# Patient Record
Sex: Female | Born: 1950 | Race: White | Hispanic: No | Marital: Married | Smoking: Never smoker
Health system: Southern US, Community
[De-identification: ages and names within clinical notes are randomized; demographics above are authoritative.]

## PROBLEM LIST (undated history)

## (undated) DIAGNOSIS — D6851 Activated protein C resistance: Secondary | ICD-10-CM

## (undated) DIAGNOSIS — Z95810 Presence of automatic (implantable) cardiac defibrillator: Secondary | ICD-10-CM

## (undated) DIAGNOSIS — T7840XA Allergy, unspecified, initial encounter: Secondary | ICD-10-CM

## (undated) DIAGNOSIS — Z95818 Presence of other cardiac implants and grafts: Secondary | ICD-10-CM

## (undated) HISTORY — DX: Allergy, unspecified, initial encounter: T78.40XA

## (undated) HISTORY — DX: Activated protein C resistance: D68.51

---

## 1999-03-08 ENCOUNTER — Other Ambulatory Visit: Admission: RE | Admit: 1999-03-08 | Discharge: 1999-03-08 | Payer: Self-pay | Admitting: Obstetrics and Gynecology

## 1999-05-30 ENCOUNTER — Ambulatory Visit (HOSPITAL_COMMUNITY): Admission: RE | Admit: 1999-05-30 | Discharge: 1999-05-30 | Payer: Self-pay | Admitting: Gastroenterology

## 2000-03-20 ENCOUNTER — Other Ambulatory Visit: Admission: RE | Admit: 2000-03-20 | Discharge: 2000-03-20 | Payer: Self-pay | Admitting: Obstetrics and Gynecology

## 2000-08-31 ENCOUNTER — Encounter: Payer: Self-pay | Admitting: Family Medicine

## 2000-08-31 ENCOUNTER — Encounter: Admission: RE | Admit: 2000-08-31 | Discharge: 2000-08-31 | Payer: Self-pay | Admitting: Family Medicine

## 2001-04-01 ENCOUNTER — Other Ambulatory Visit: Admission: RE | Admit: 2001-04-01 | Discharge: 2001-04-01 | Payer: Self-pay | Admitting: Obstetrics and Gynecology

## 2002-04-08 ENCOUNTER — Other Ambulatory Visit: Admission: RE | Admit: 2002-04-08 | Discharge: 2002-04-08 | Payer: Self-pay | Admitting: Obstetrics and Gynecology

## 2002-06-19 ENCOUNTER — Ambulatory Visit (HOSPITAL_COMMUNITY): Admission: RE | Admit: 2002-06-19 | Discharge: 2002-06-19 | Payer: Self-pay | Admitting: Obstetrics and Gynecology

## 2002-06-19 ENCOUNTER — Encounter (INDEPENDENT_AMBULATORY_CARE_PROVIDER_SITE_OTHER): Payer: Self-pay

## 2002-10-03 ENCOUNTER — Ambulatory Visit (HOSPITAL_COMMUNITY): Admission: RE | Admit: 2002-10-03 | Discharge: 2002-10-03 | Payer: Self-pay | Admitting: Obstetrics and Gynecology

## 2002-10-03 ENCOUNTER — Encounter (INDEPENDENT_AMBULATORY_CARE_PROVIDER_SITE_OTHER): Payer: Self-pay

## 2003-04-24 ENCOUNTER — Other Ambulatory Visit: Admission: RE | Admit: 2003-04-24 | Discharge: 2003-04-24 | Payer: Self-pay | Admitting: Obstetrics and Gynecology

## 2004-06-06 ENCOUNTER — Other Ambulatory Visit: Admission: RE | Admit: 2004-06-06 | Discharge: 2004-06-06 | Payer: Self-pay | Admitting: Obstetrics and Gynecology

## 2005-07-05 ENCOUNTER — Other Ambulatory Visit: Admission: RE | Admit: 2005-07-05 | Discharge: 2005-07-05 | Payer: Self-pay | Admitting: Obstetrics and Gynecology

## 2006-05-16 ENCOUNTER — Other Ambulatory Visit: Admission: RE | Admit: 2006-05-16 | Discharge: 2006-05-16 | Payer: Self-pay | Admitting: Family Medicine

## 2006-07-04 ENCOUNTER — Encounter: Admission: RE | Admit: 2006-07-04 | Discharge: 2006-07-04 | Payer: Self-pay | Admitting: Family Medicine

## 2006-07-17 ENCOUNTER — Encounter: Admission: RE | Admit: 2006-07-17 | Discharge: 2006-07-17 | Payer: Self-pay | Admitting: Family Medicine

## 2007-05-20 ENCOUNTER — Other Ambulatory Visit: Admission: RE | Admit: 2007-05-20 | Discharge: 2007-05-20 | Payer: Self-pay | Admitting: Family Medicine

## 2008-07-06 ENCOUNTER — Other Ambulatory Visit: Admission: RE | Admit: 2008-07-06 | Discharge: 2008-07-06 | Payer: Self-pay | Admitting: Family Medicine

## 2009-07-07 ENCOUNTER — Other Ambulatory Visit: Admission: RE | Admit: 2009-07-07 | Discharge: 2009-07-07 | Payer: Self-pay | Admitting: Family Medicine

## 2010-09-22 ENCOUNTER — Other Ambulatory Visit: Payer: Self-pay | Admitting: Physician Assistant

## 2010-09-22 ENCOUNTER — Other Ambulatory Visit (HOSPITAL_COMMUNITY)
Admission: RE | Admit: 2010-09-22 | Discharge: 2010-09-22 | Disposition: A | Discharge status: Home / Self Care | Payer: BC Managed Care – PPO | Source: Ambulatory Visit | Attending: Family Medicine | Admitting: Family Medicine

## 2010-09-22 DIAGNOSIS — Z124 Encounter for screening for malignant neoplasm of cervix: Secondary | ICD-10-CM | POA: Insufficient documentation

## 2010-10-05 ENCOUNTER — Other Ambulatory Visit: Payer: Self-pay | Admitting: Dermatology

## 2010-10-07 NOTE — Op Note (Signed)
   NAME:  Jacqueline Bruce, Jacqueline Bruce                        ACCOUNT NO.:  [id number]   MEDICAL RECORD NO.:  [id number]                   PATIENT TYPE:  AMB   LOCATION:  [facility]C                                  FACILITY:  [facility]   PHYSICIAN:  Juluis Mire, M.D.                DATE OF BIRTH:  [birth year]   DATE OF PROCEDURE:  10/03/2002  DATE OF DISCHARGE:                                 OPERATIVE REPORT   PREOPERATIVE DIAGNOSIS:  History of complex hyperplasia.   POSTOPERATIVE DIAGNOSIS:  History of complex hyperplasia.   OPERATIVE PROCEDURE:  Hysteroscopy.  Multiple endometrial biopsies.  Endometrial curettings.   SURGEON:  Juluis Mire, M.D.   ANESTHESIA:  General.   ESTIMATED BLOOD LOSS:  Minimal.   PACKS AND DRAINS:  None.   INTRAOPERATIVE BLOOD REPLACED:  None.   COMPLICATIONS:  None.   INDICATIONS FOR PROCEDURE:  Dictated in the history and physical.   PROCEDURE:  The patient was taken to the OR and placed in the supine  position.  After a satisfactory level of general anesthesia had been  obtained, the patient was placed in the dorsal lithotomy position using the  Allen stirrups.  The perineum and vagina were prepped out with Betadine and  draped as a sterile field.  A speculum was placed in the vaginal vault.  The  cervix was grasped with a single-tooth tenaculum.  The uterus sounded to  approximately 7 cm.  The cervix was serially dilated to a size 37 Pratt  dilator.  The operative hysteroscope was introduced.  The uterine cavity was  visualized after distension with sorbitol.  The endometrium appeared to be  smooth and atrophic.  There was no polyp, outgrowths, or other abnormalities  noted.  We did multiple random endometrial biopsies from the anterior,  posterior, and lateral walls.  There were no signs of complications or  perforation, and no active bleeding was noted.  We subsequently obtained  endometrial curettings.  At this point in time, the single-tooth tenaculum  and  speculum were then removed.  The patient was taken out of the dorsal  lithotomy position and once alert and extubated was transferred to the  recovery room in good condition.  Sponge, needle, and instrument counts were  correct.                                               Juluis Mire, M.D.    JSM/MEDQ  D:  10/03/2002  T:  10/03/2002  Job:  [id number]

## 2010-10-07 NOTE — H&P (Signed)
NAME:  Jacqueline Bruce, Jacqueline Bruce NO.:  [id number]   MEDICAL RECORD NO.:  [id number]                   PATIENT TYPE:  AMB   LOCATION:  SDC                                  FACILITY:  [facility]   PHYSICIAN:  Juluis Mire, M.D.                DATE OF BIRTH:  1951-04-29   DATE OF ADMISSION:  10/03/2002  DATE OF DISCHARGE:                                HISTORY & PHYSICAL   HISTORY OF PRESENT ILLNESS:  The patient is a 60 year old, G2, P0, AB2,  married, white female who presents for hysteroscopic evaluation.  In  relation to the present admission, the patient has had a previous bilateral  tubal ligation.  Because of abnormal bleeding in the form of premenstrual  spotting, she underwent a saline infusion ultrasound earlier this year that  did reveal a large endometrial polyp.  She subsequently underwent  hysteroscopic resection of this polyp in January 2004.  Pathology did reveal  complex hyperplasia without atypia.  They said some of the resection had  worrisome changes and they recommended clinical followup.  We had discussed  with the patient these findings and had offered her options.  She decided to  proceed with cycling with Aygestin which she has done with some continued  minimal withdraw bleeding and she now presents for repeat hysteroscopic  evaluation to rule out any type of further endometrial issues.   ALLERGIES:  No known drug allergies.   MEDICATIONS:  None.   PAST MEDICAL HISTORY:  1. Heterozygous for Leiden factor V mutation.  She has been seen by Dr.     Myna Hidalgo for that in the past and is not on any medication.  2. History of uterine fibroids.  3. It has been told that she has a presumptive diagnosis of endometriosis     due to symptomatology, although no surgical evaluation has been     undertaken.   PAST SURGICAL HISTORY:  Laparoscopic bilateral tubal ligation.   PAST OBSTETRICAL HISTORY:  She has had two abortions.   FAMILY HISTORY:   Noncontributory.   SOCIAL HISTORY:  No tobacco or alcohol use.   REVIEW OF SYMPTOMS:  Noncontributory.   PHYSICAL EXAMINATION:  VITAL SIGNS:  Afebrile, stable vital signs.  HEENT:  The patient is normocephalic.  Pupils equal round and reactive to  light and accommodation.  Extraocular movements intact.  Extraocular  movements intact.  Sclerae and conjunctivae are clear.  Oropharynx clear.  NECK:  Without thyromegaly.  BREASTS:  No discrete masses.  LUNGS:  Clear.  CARDIAC:  Regular rate and rhythm without murmurs, rubs or gallops.  ABDOMEN:  Benign.  No masses, organomegaly or tenderness.  PELVIC:  Normal external genitalia.  Vaginal cuff is clear.  Cervix  unremarkable.  Uterus normal size, shape and contour.  Adnexa free of masses  or tenderness.  Rectovaginal exam is clear.  EXTREMITIES:  Trace edema.  NEUROLOGIC:  Grossly  within normal limits.   IMPRESSION:  Previous hysteroscopic evaluation with finding of complex  hyperplasia.   PLAN:  At the present time, the patient underwent repeat hysteroscopy and  biopsies to rule out any persistent issues or other complications.  The  risks of surgery have been discussed including the risk of infection, risk  of vascular injury that could lead to hemorrhage requiring possible  transfusion or hysterectomy, risk of perforation that could lead to injury  to bowel or other nearby organs requiring exploratory surgery and the risk  of deep venous thrombosis and pulmonary embolus.  The patient will be given  preop heparin.                                               Juluis Mire, M.D.    JSM/MEDQ  D:  10/03/2002  T:  10/03/2002  Job:  [id number]

## 2010-10-07 NOTE — Op Note (Signed)
   NAME:  Jacqueline Bruce, Jacqueline Bruce                        ACCOUNT NO.:  [id number]   MEDICAL RECORD NO.:  [id number]                   PATIENT TYPE:  AMB   LOCATION:  SDC                                  FACILITY:  [facility]   PHYSICIAN:  Juluis Mire, M.D.                DATE OF BIRTH:  03-30-1951   DATE OF PROCEDURE:  06/19/2002  DATE OF DISCHARGE:                                 OPERATIVE REPORT   PREOPERATIVE DIAGNOSES:  1. Abnormal uterine bleeding.  2. Endometrial polyp.   POSTOPERATIVE DIAGNOSES:  1. Abnormal uterine bleeding.  2. Endometrial polyp.   OPERATIVE PROCEDURES:  1. Paracervical block.  2. Cervical dilation.  3. Hysteroscopy.  4. Resection of polyp and multiple endometrial biopsies.  5. Endometrial curettings.   SURGEON:  Juluis Mire, M.D.   ANESTHESIA:  Paracervical block and sedation.   ESTIMATED BLOOD LOSS:  Minimal.   PACKS AND DRAINS:  None.   FLUIDS REPLACED:  No intraoperative blood replaced.   COMPLICATIONS:  None.   INDICATIONS FOR PROCEDURE:  These are dictated in the history and physical.  It is of note that the patient did receive preoperative heparin due to  history of being heterozygous for Leiden factor V.   DESCRIPTION OF PROCEDURE:  The patient was taken to the OR and placed in the  supine position.  After light sedation, was placed in the dorsal lithotomy  position using Allen stirrups.  The patient was draped out for hysteroscopy.  A speculum was placed in the vaginal vault.  The cervix and vagina were  cleansed with Betadine.  A paracervical block was instilled using 1%  Xylocaine.  The cervix was secured with single-tooth tenaculum.   The uterus sounded to approximately 8 cm.  The cervix dilated to size 33  Pratt dilator.  The hysteroscope was then introduced.  Visualization  revealed small polyp-like outgrowths from the fundal area as well as the  right lateral uterine wall.  These were resected and sent for pathology  review.   Multiple endometrial samplings were also obtained from the anterior  and posterior lateral walls.  We then obtained endometrial curettings.  There was no active bleeding or signs of perforation.   The hysteroscope, single-tooth tenaculum, and speculum were then removed.  The patient was taken out of the dorsal lithotomy position.   Once alert, the patient was transferred to the recovery room in good  condition.  Sponge and instrument counts reported correct per the  circulating nurse.                                               Juluis Mire, M.D.    JSM/MEDQ  D:  06/19/2002  T:  06/19/2002  Job:  [id number]

## 2010-10-07 NOTE — H&P (Signed)
NAME:  Jacqueline Bruce, BOLANOS NO.:  [id number]   MEDICAL RECORD NO.:  [id number]                   PATIENT TYPE:  AMB   LOCATION:  [facility]C                                  FACILITY:  [facility]   PHYSICIAN:  Juluis Mire, M.D.                DATE OF BIRTH:  November 12, 1950   DATE OF ADMISSION:  DATE OF DISCHARGE:                                HISTORY & PHYSICAL   CHIEF COMPLAINT:  The patient is a 60 year old gravida 2 para 0 abortus 2  married white female who presents for hysteroscopic evaluation.   HISTORY OF PRESENT ILLNESS:  The patient has had a previous bilateral tubal  ligation.  Because of abnormal bleeding in the form of premenstrual  spotting.  She underwent a saline infusion ultrasound that did reveal a  large endometrial polyp.  The patient now presents for hysteroscopic  evaluation and resection to rule out endometrial pathology.   ALLERGIES:  No known drug allergies.   MEDICATIONS:  None.   PAST MEDICAL HISTORY:  She is heterozygous for Leiden factor V mutation.  Has been seen by Dr. Myna Hidalgo for that.  Is not on any medication.  Does have  a history of uterine fibroids and has been told that she has presumptive  diagnosis of endometriosis due to symptomatology but no active surgical  evaluation has been undertaken.   PREVIOUS SURGICAL HISTORY:  Laparoscopic bilateral tubal ligation.   OBSTETRICAL HISTORY:  She has had two abortions.   FAMILY HISTORY:  Noncontributory.   SOCIAL HISTORY:  No tobacco or alcohol use.   REVIEW OF SYSTEMS:  Noncontributory.   PHYSICAL EXAMINATION:  VITAL SIGNS:  The patient is afebrile with stable  vital signs.  HEENT:  The patient normocephalic.  Pupils are equal, round, and reactive to  light and accommodation.  Extraocular movements are intact.  Sclerae and  conjunctivae are clear, oropharynx clear.  NECK:  Without thyromegaly.  BREASTS:  No discrete masses.  LUNGS:  Clear.  CARDIOVASCULAR:  Regular rhythm and  rate without murmurs or gallops.  ABDOMEN:  Benign.  No masses, organomegaly, or tenderness.  PELVIC:  Normal external genitalia, vaginal mucosa is clear.  Cervix is  unremarkable.  Uterus is normal size, shape, and contour.  Adnexa free of  masses or tenderness.  Rectovaginal exam is clear.  EXTREMITIES:  Trace edema.  NEUROLOGIC:  Grossly within normal limits.   IMPRESSION:  1. Abnormal uterine bleeding with evidence of endometrial polyp.  2. Heterozygous for Leiden factor V mutation.   PLAN:  The patient to undergo outpatient hysteroscopy with D&C.  This will  be under sedation with local.  Risks have been discussed including the risk  of infection; the risk of vascular injury that could require transfusion or  possible hysterectomy; the risk of uterine perforation that could lead to  injury to adjacent organs; the risk of deep venous thrombosis and pulmonary  embolus.  The patient professed an understanding of the indications and  risks.                                               Juluis Mire, M.D.    JSM/MEDQ  D:  06/19/2002  T:  06/19/2002  Job:  [id number]

## 2013-11-10 ENCOUNTER — Telehealth: Payer: Self-pay | Admitting: *Deleted

## 2013-11-10 NOTE — Telephone Encounter (Signed)
I returned her call.  She stated,my feet bother me with certain shoes.  The pins are protruding now.  Can the pins be taken out?  I informed her that yes, they can be taken out.  She asked how long she would be out of work or unable to walk.  I told her she would be able to walk immediately.  She asked if we still had her chart from 2008.  I told her we probably don't because we're part of [facility] now on the [facility] system.  I told her it's probably in storage.  She stated so you'll probably have to take x-rays again.  I told her yes, she will have to be x-rayed.  I transferred her to a scheduler to make an appointment with Dr. Amalia Hailey.

## 2013-11-10 NOTE — Telephone Encounter (Signed)
I'd like to look into getting the pins removed.  Had surgery in February and August 2008, Bunionectomy.  My thought is I don't need x-rays.  Is this a possibility?  I attempted to call her back there was not phone connection at this number.

## 2013-11-24 ENCOUNTER — Encounter: Payer: Self-pay | Admitting: Podiatry

## 2013-11-24 ENCOUNTER — Ambulatory Visit (INDEPENDENT_AMBULATORY_CARE_PROVIDER_SITE_OTHER): Payer: 59 | Admitting: Podiatry

## 2013-11-24 ENCOUNTER — Ambulatory Visit (INDEPENDENT_AMBULATORY_CARE_PROVIDER_SITE_OTHER): Payer: 59

## 2013-11-24 VITALS — BP 110/68 | HR 60 | Resp 12

## 2013-11-24 DIAGNOSIS — R52 Pain, unspecified: Secondary | ICD-10-CM

## 2013-11-24 DIAGNOSIS — Z472 Encounter for removal of internal fixation device: Secondary | ICD-10-CM

## 2013-11-24 NOTE — Progress Notes (Signed)
   Subjective:    Patient ID: Jacqueline Bruce, female    DOB: Nov 30, 1950, 63 y.o.   MRN: [id number]  HPI  PT STATED HAD BUNION SURGERY  ON BOTH FEET AND THE PIN START BOTHERING HER FOR 1 YEAR. THE FEET ARE GETTING WORSE. THE FEET GET AGGRAVATED BY WEARING CERTAIN SHOES AND TRIED NO TREATMENT.  This patient is complaining of discomfort with tight shoes especially ski boots over the K wire fixation from a previous osteotomies to correct bunionectomies many years ago. The symptoms are relieved when she wears a soft shoe  Review of Systems  All other systems reviewed and are negative.      Objective:   Physical Exam Orientated x3 white female  Vascular: DP and PT pulses 2/4 bilaterally  Neurological: Ankle flex equal and reactive bilaterally Vibratory sensation intact bilaterally  Dermatological: Well-healed surgical scars over the left first MPJ and second MPJ noted bilaterally There is a palpable edge of the K wire of the right and left first metatarsal when palpated duplicates her area of discomfort.  Musculoskeletal: No deformities noted  X-ray examination right foot  Intact bony structures done a fracture and/or dislocation noted  Retained internal cross K wire fixation first metatarsal with a circular wire over the back edges of the K wires. There is no movement of the K wires  Well-healed first metatarsal osteotomy  Well-healed second left metatarsal osteotomy with internal screw fixation  Radiographic impression:  Well-healed first and second left metatarsal osteotomies with retained internal fixation  The K wires on the left first metatarsal are symptomatic with tight ski boots as noted with a circular marker      X-ray examination left foot  Intact bony structure without fracture and/or dislocation noted  Retained internal cross K wire fixation left first metatarsal without any movement of this fixation. A circular marker over the back end of the K wire which was  clinically significant was noted  The first metatarsal the osteotomy is well-healed  Internal screw fixation second left metatarsal with well-healed metatarsal osteotomy  Radiographic impression:  Well-healed first and second left metatarsal osteotomies  Retained internal fixation left first metatarsal with clinical discomfort with tight shoes as ski boots  Retained internal fixation second left metatarsal without any clinical symptoms       Assessment & Plan:   Assessment: Symptomatic internal fixation (cross K wire fixation) first metatarsals bilaterally  Plan: I advised patient that the internal fixation was in satisfactory position, however, if tight shoes irritated these areas the K wires could be removed. I described the surgical removal of these wires as an outpatient procedure and the need to reduce her standing walking for several weeks after surgery.  Patient is currently walking dogs on a regular basis and cannot take 3 weeks off to allow incisions to heal.  She'll notify us when she request removal of the internal fixation in the first metatarsals bilaterally

## 2013-11-24 NOTE — Patient Instructions (Signed)
Contact the office when you want to have the internal fixation removed in the first metatarsals on the right and left feet

## 2013-11-25 ENCOUNTER — Encounter: Payer: Self-pay | Admitting: Podiatry

## 2013-12-22 ENCOUNTER — Other Ambulatory Visit (HOSPITAL_COMMUNITY)
Admission: RE | Admit: 2013-12-22 | Discharge: 2013-12-22 | Disposition: A | Discharge status: Home / Self Care | Payer: 59 | Source: Ambulatory Visit | Attending: Family Medicine | Admitting: Family Medicine

## 2013-12-22 ENCOUNTER — Other Ambulatory Visit: Payer: Self-pay | Admitting: Physician Assistant

## 2013-12-22 DIAGNOSIS — Z124 Encounter for screening for malignant neoplasm of cervix: Secondary | ICD-10-CM | POA: Diagnosis present

## 2013-12-23 LAB — CYTOLOGY - PAP

## 2016-01-06 ENCOUNTER — Other Ambulatory Visit (HOSPITAL_COMMUNITY)
Admission: RE | Admit: 2016-01-06 | Discharge: 2016-01-06 | Disposition: A | Discharge status: Home / Self Care | Payer: BLUE CROSS/BLUE SHIELD | Source: Ambulatory Visit | Attending: Family Medicine | Admitting: Family Medicine

## 2016-01-06 ENCOUNTER — Other Ambulatory Visit: Payer: Self-pay | Admitting: Physician Assistant

## 2016-01-06 DIAGNOSIS — Z124 Encounter for screening for malignant neoplasm of cervix: Secondary | ICD-10-CM | POA: Diagnosis present

## 2016-01-07 LAB — CYTOLOGY - PAP

## 2016-07-18 ENCOUNTER — Ambulatory Visit
Admission: RE | Admit: 2016-07-18 | Discharge: 2016-07-18 | Disposition: A | Discharge status: Home / Self Care | Payer: Medicare Other | Source: Ambulatory Visit | Attending: Physician Assistant | Admitting: Physician Assistant

## 2016-07-18 ENCOUNTER — Other Ambulatory Visit: Payer: Self-pay | Admitting: Physician Assistant

## 2016-07-18 DIAGNOSIS — M25551 Pain in right hip: Secondary | ICD-10-CM

## 2016-07-18 IMAGING — CR DG HIP (WITH OR WITHOUT PELVIS) 3-4V BILAT
3 series · 3 of 3 positions shown · non-contrast
Comparison: No recent prior.

CLINICAL DATA: Bilateral hip pain, right side greater than left.

EXAM:
DG HIP (WITH OR WITHOUT PELVIS) 3-4V BILAT

[w pelvis]
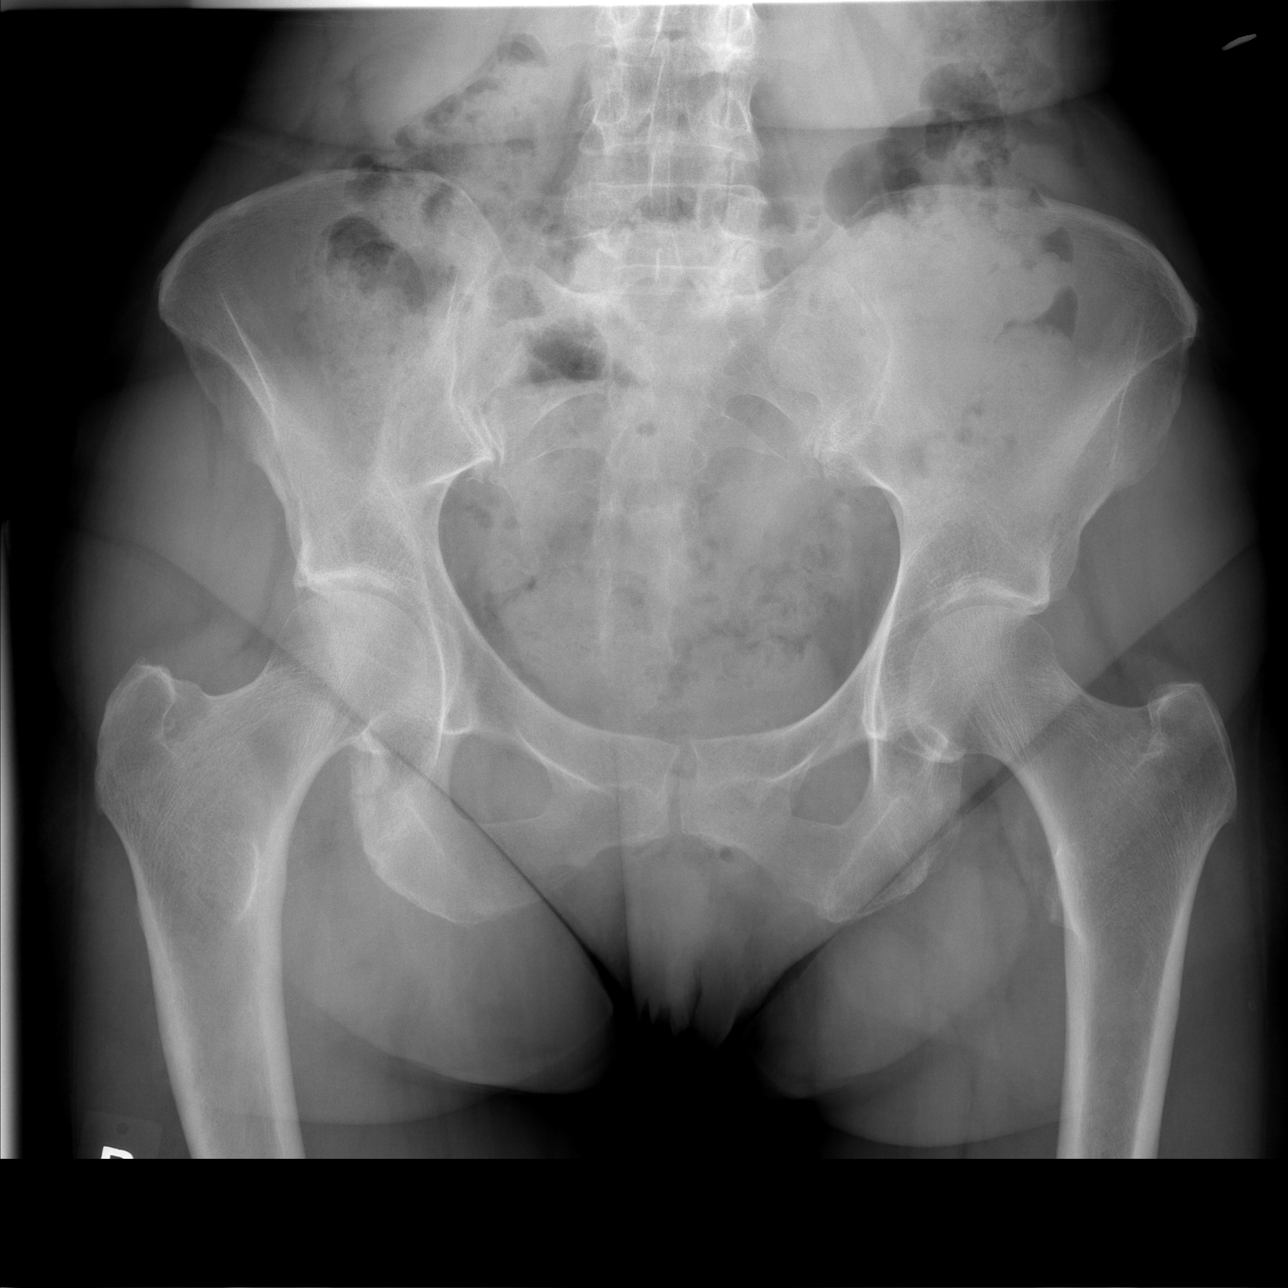

[t hip frog leg left]
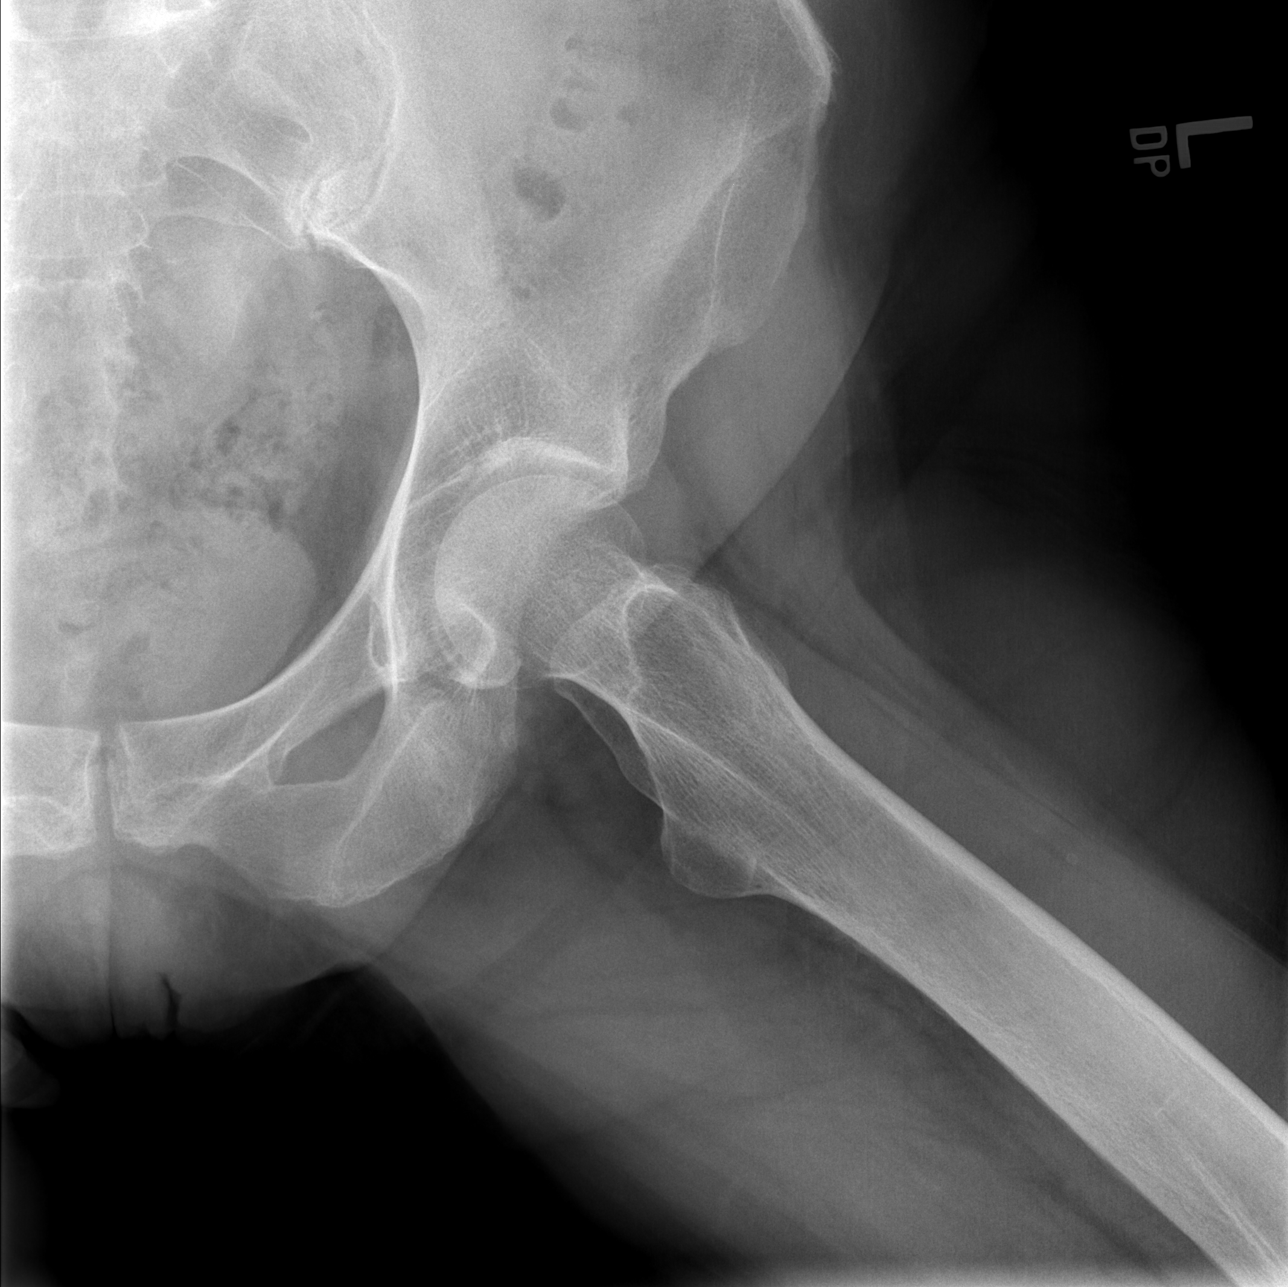

[t hip frog leg right]
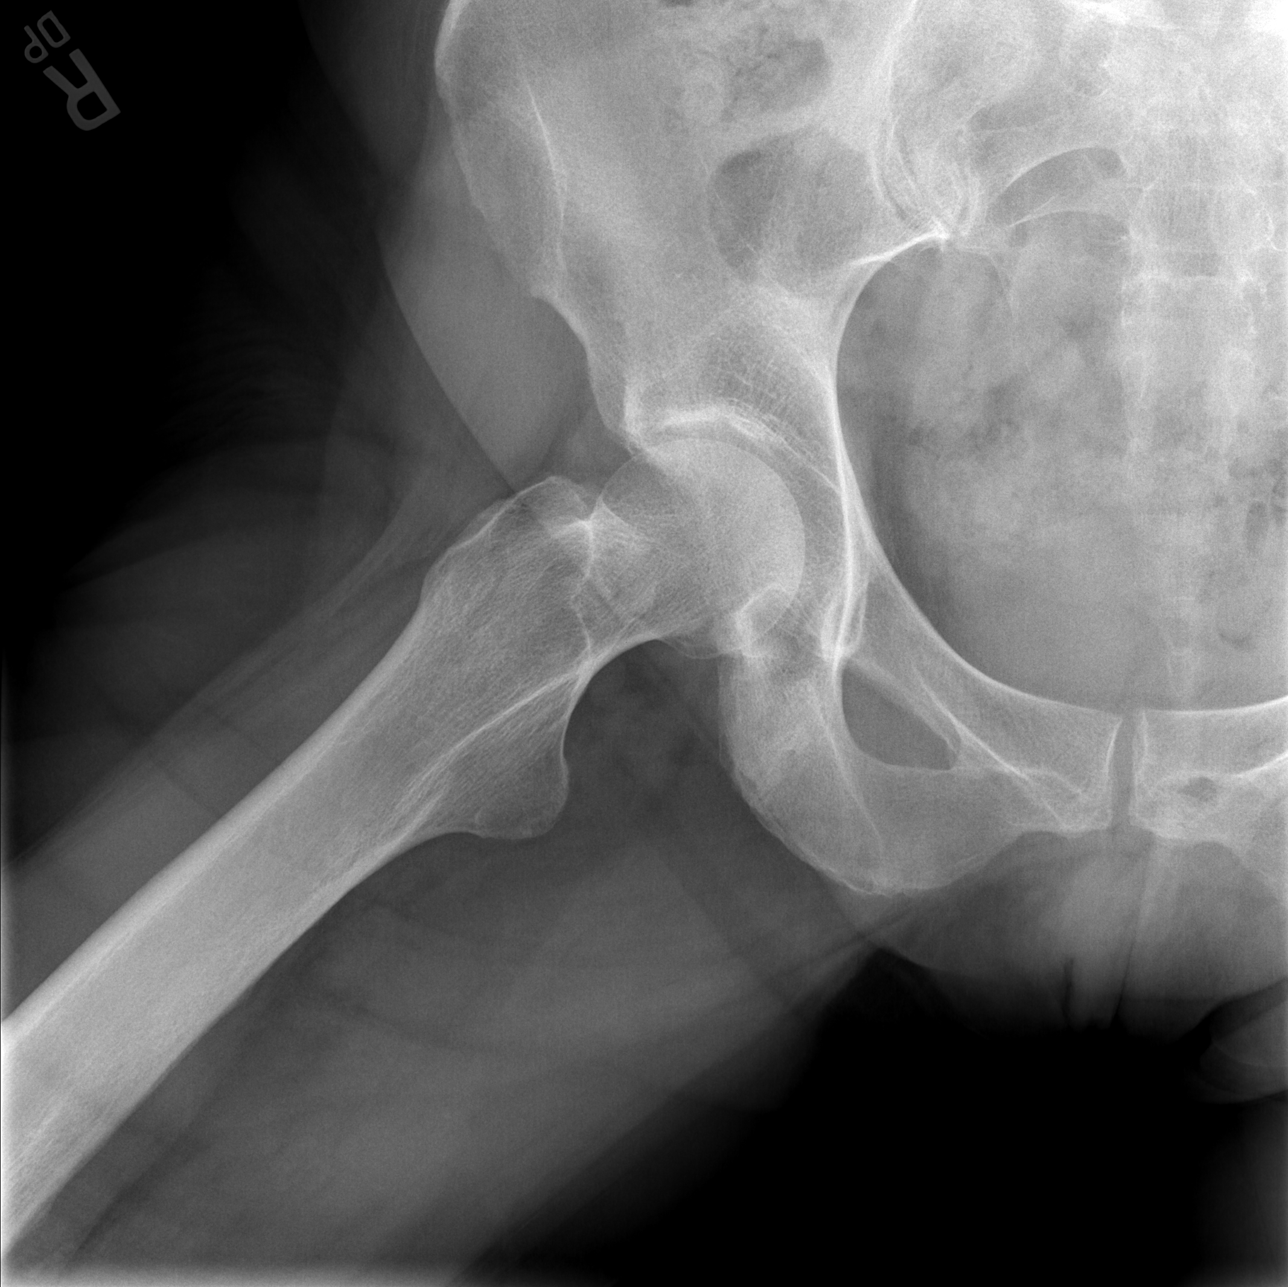

[3 of 3 positions shown; findings below may reference images not displayed]

FINDINGS: Mild degenerative changes both hips. No acute abnormality
identified. No focal abnormality.
IMPRESSION: Mild degenerative changes both hips.  No acute abnormality.

## 2019-04-15 ENCOUNTER — Observation Stay (HOSPITAL_COMMUNITY): Payer: Medicare Other

## 2019-04-15 ENCOUNTER — Inpatient Hospital Stay (HOSPITAL_COMMUNITY)
Admission: EM | Admit: 2019-04-15 | Discharge: 2019-04-17 | DRG: 066 | Disposition: A | Discharge status: Home / Self Care | Payer: Medicare Other | Attending: Internal Medicine | Admitting: Internal Medicine

## 2019-04-15 ENCOUNTER — Observation Stay (HOSPITAL_BASED_OUTPATIENT_CLINIC_OR_DEPARTMENT_OTHER): Payer: Medicare Other

## 2019-04-15 ENCOUNTER — Encounter (HOSPITAL_COMMUNITY): Payer: Self-pay | Admitting: Emergency Medicine

## 2019-04-15 ENCOUNTER — Other Ambulatory Visit: Payer: Self-pay

## 2019-04-15 ENCOUNTER — Emergency Department (HOSPITAL_COMMUNITY): Payer: Medicare Other

## 2019-04-15 DIAGNOSIS — E785 Hyperlipidemia, unspecified: Secondary | ICD-10-CM | POA: Diagnosis present

## 2019-04-15 DIAGNOSIS — R4781 Slurred speech: Secondary | ICD-10-CM

## 2019-04-15 DIAGNOSIS — I749 Embolism and thrombosis of unspecified artery: Secondary | ICD-10-CM

## 2019-04-15 DIAGNOSIS — R001 Bradycardia, unspecified: Secondary | ICD-10-CM | POA: Diagnosis present

## 2019-04-15 DIAGNOSIS — R4701 Aphasia: Secondary | ICD-10-CM | POA: Diagnosis present

## 2019-04-15 DIAGNOSIS — R29701 NIHSS score 1: Secondary | ICD-10-CM | POA: Diagnosis present

## 2019-04-15 DIAGNOSIS — M8580 Other specified disorders of bone density and structure, unspecified site: Secondary | ICD-10-CM

## 2019-04-15 DIAGNOSIS — F419 Anxiety disorder, unspecified: Secondary | ICD-10-CM

## 2019-04-15 DIAGNOSIS — G459 Transient cerebral ischemic attack, unspecified: Secondary | ICD-10-CM | POA: Diagnosis present

## 2019-04-15 DIAGNOSIS — I10 Essential (primary) hypertension: Secondary | ICD-10-CM | POA: Diagnosis present

## 2019-04-15 DIAGNOSIS — Z20828 Contact with and (suspected) exposure to other viral communicable diseases: Secondary | ICD-10-CM | POA: Diagnosis present

## 2019-04-15 DIAGNOSIS — I6340 Cerebral infarction due to embolism of unspecified cerebral artery: Principal | ICD-10-CM | POA: Diagnosis present

## 2019-04-15 DIAGNOSIS — Z7982 Long term (current) use of aspirin: Secondary | ICD-10-CM

## 2019-04-15 DIAGNOSIS — Z8249 Family history of ischemic heart disease and other diseases of the circulatory system: Secondary | ICD-10-CM

## 2019-04-15 DIAGNOSIS — I639 Cerebral infarction, unspecified: Secondary | ICD-10-CM

## 2019-04-15 HISTORY — DX: Slurred speech: R47.81

## 2019-04-15 HISTORY — DX: Embolism and thrombosis of unspecified artery: I74.9

## 2019-04-15 HISTORY — DX: Embolism and thrombosis of unspecified artery: G45.9

## 2019-04-15 HISTORY — DX: Other specified disorders of bone density and structure, unspecified site: M85.80

## 2019-04-15 HISTORY — DX: Bradycardia, unspecified: R00.1

## 2019-04-15 HISTORY — DX: Anxiety disorder, unspecified: F41.9

## 2019-04-15 LAB — CBC
HCT: 42.9 % (ref 36.0–46.0)
Hemoglobin: 14 g/dL (ref 12.0–15.0)
MCH: 30.3 pg (ref 26.0–34.0)
MCHC: 32.6 g/dL (ref 30.0–36.0)
MCV: 92.9 fL (ref 80.0–100.0)
Platelets: 258 10*3/uL (ref 150–400)
RBC: 4.62 MIL/uL (ref 3.87–5.11)
RDW: 13.2 % (ref 11.5–15.5)
WBC: 6.7 10*3/uL (ref 4.0–10.5)
nRBC: 0 % (ref 0.0–0.2)

## 2019-04-15 LAB — DIFFERENTIAL
Abs Immature Granulocytes: 0.02 10*3/uL (ref 0.00–0.07)
Basophils Absolute: 0 10*3/uL (ref 0.0–0.1)
Basophils Relative: 0 %
Eosinophils Absolute: 0.1 10*3/uL (ref 0.0–0.5)
Eosinophils Relative: 2 %
Immature Granulocytes: 0 %
Lymphocytes Relative: 31 %
Lymphs Abs: 2.1 10*3/uL (ref 0.7–4.0)
Monocytes Absolute: 0.5 10*3/uL (ref 0.1–1.0)
Monocytes Relative: 8 %
Neutro Abs: 4 10*3/uL (ref 1.7–7.7)
Neutrophils Relative %: 59 %

## 2019-04-15 LAB — COMPREHENSIVE METABOLIC PANEL
ALT: 22 U/L (ref 0–44)
AST: 25 U/L (ref 15–41)
Albumin: 4.2 g/dL (ref 3.5–5.0)
Alkaline Phosphatase: 58 U/L (ref 38–126)
Anion gap: 8 (ref 5–15)
BUN: 22 mg/dL (ref 8–23)
CO2: 25 mmol/L (ref 22–32)
Calcium: 10.5 mg/dL — ABNORMAL HIGH (ref 8.9–10.3)
Chloride: 107 mmol/L (ref 98–111)
Creatinine, Ser: 1.12 mg/dL — ABNORMAL HIGH (ref 0.44–1.00)
GFR calc Af Amer: 59 mL/min — ABNORMAL LOW (ref >60)
GFR calc non Af Amer: 51 mL/min — ABNORMAL LOW (ref >60)
Glucose, Bld: 98 mg/dL (ref 70–99)
Potassium: 4.1 mmol/L (ref 3.5–5.1)
Sodium: 140 mmol/L (ref 135–145)
Total Bilirubin: 0.6 mg/dL (ref 0.3–1.2)
Total Protein: 7.3 g/dL (ref 6.5–8.1)

## 2019-04-15 LAB — I-STAT CHEM 8, ED
BUN: 29 mg/dL — ABNORMAL HIGH (ref 8–23)
Calcium, Ion: 1.13 mmol/L — ABNORMAL LOW (ref 1.15–1.40)
Chloride: 109 mmol/L (ref 98–111)
Creatinine, Ser: 1 mg/dL (ref 0.44–1.00)
Glucose, Bld: 96 mg/dL (ref 70–99)
HCT: 43 % (ref 36.0–46.0)
Hemoglobin: 14.6 g/dL (ref 12.0–15.0)
Potassium: 4.5 mmol/L (ref 3.5–5.1)
Sodium: 136 mmol/L (ref 135–145)
TCO2: 24 mmol/L (ref 22–32)

## 2019-04-15 LAB — ECHOCARDIOGRAM COMPLETE
Height: 67 in
Weight: 2480 oz

## 2019-04-15 LAB — APTT: aPTT: 28 seconds (ref 24–36)

## 2019-04-15 LAB — PROTIME-INR
INR: 0.9 (ref 0.8–1.2)
Prothrombin Time: 12.5 seconds (ref 11.4–15.2)

## 2019-04-15 LAB — SARS CORONAVIRUS 2 (TAT 6-24 HRS): SARS Coronavirus 2: NEGATIVE

## 2019-04-15 LAB — CBG MONITORING, ED: Glucose-Capillary: 79 mg/dL (ref 70–99)

## 2019-04-15 IMAGING — CT CT HEAD CODE STROKE
3 series · 15 of 47 positions shown, 18 images · non-contrast
Comparison: None.

CLINICAL DATA: Code stroke.  68-year-old female.  Resolved aphasia.

EXAM:
CT HEAD WITHOUT CONTRAST
TECHNIQUE: Contiguous axial images were obtained from the base of the skull
through the vertex without intravenous contrast.

[Series 3: head 5.0 st · axial · 0.43mm/px · z∈[-138,+2]mm · 9 of 34 slices shown, 12 images]
[im 3/34  brain]
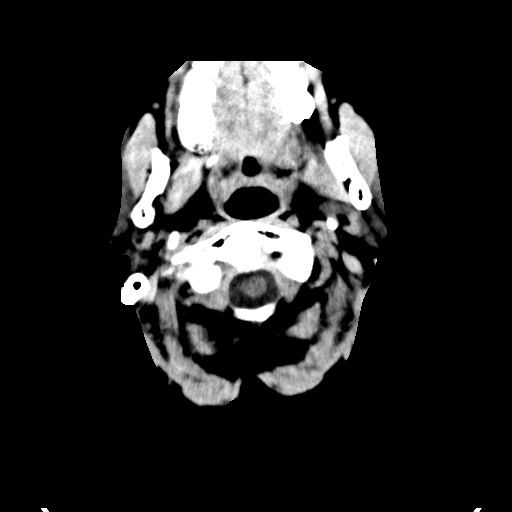
[im 3/34  bone]
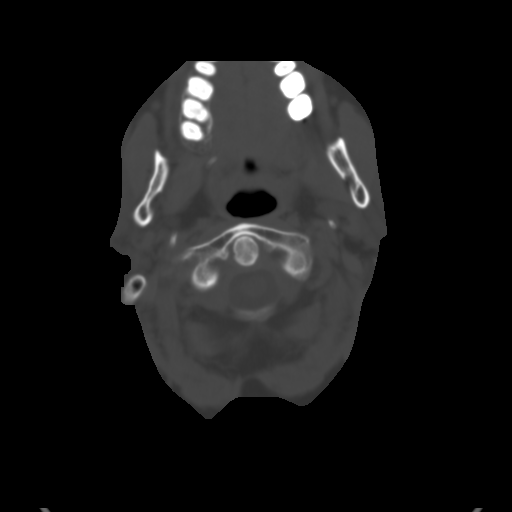
[im 6/34  brain]
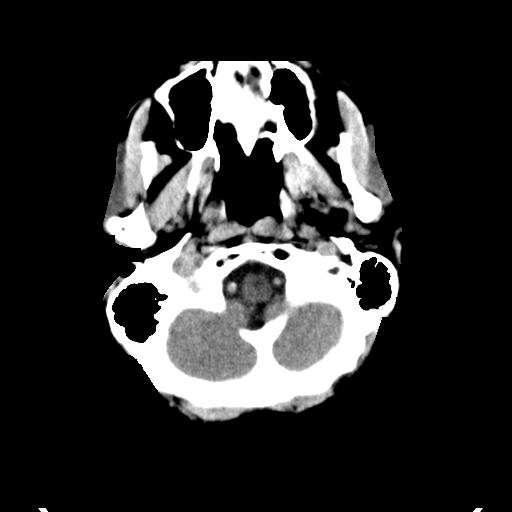
[im 10/34  brain]
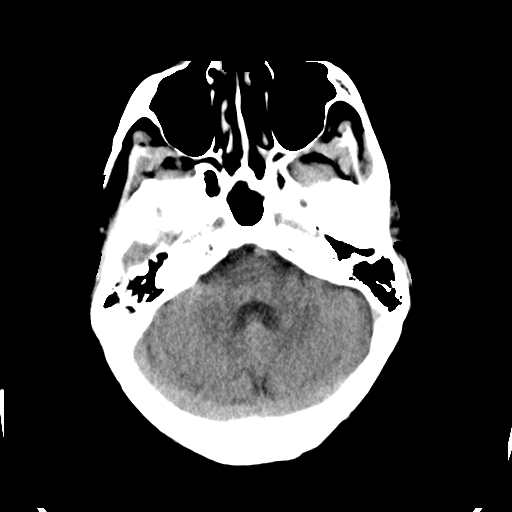
[im 13/34  brain]
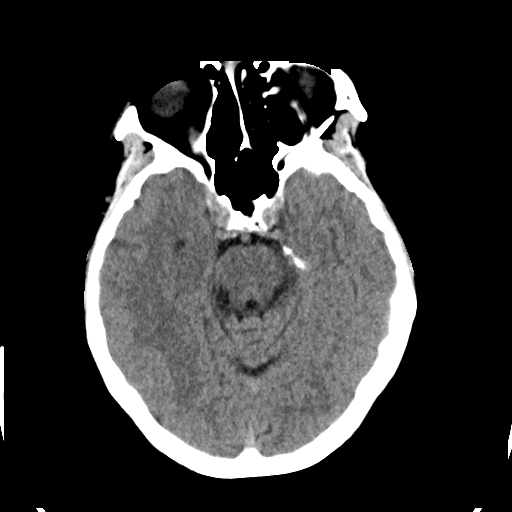
[im 18/34  brain]
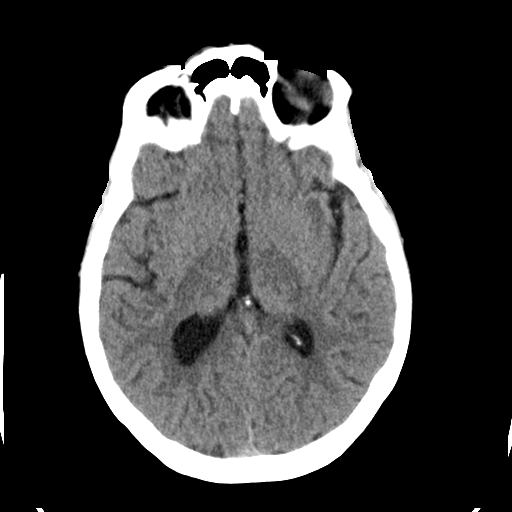
[im 18/34  bone]
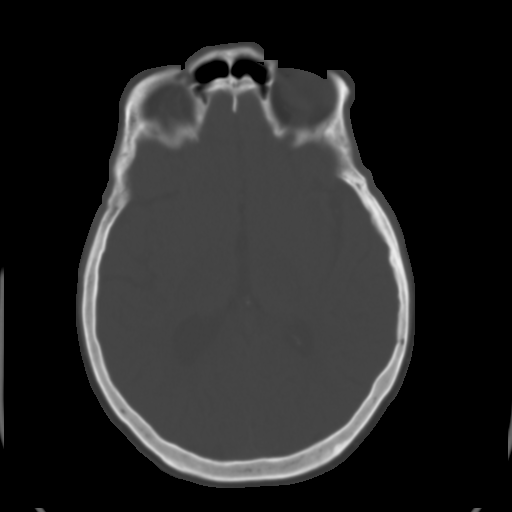
[im 21/34  brain]
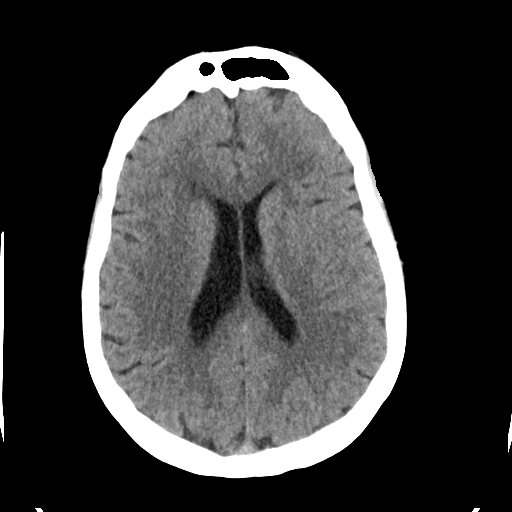
[im 24/34  brain]
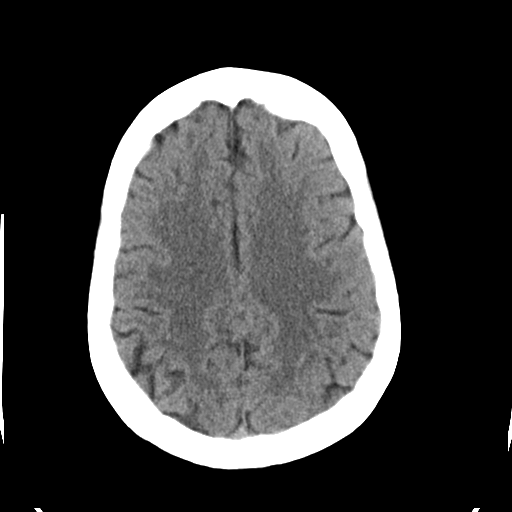
[im 28/34  brain]
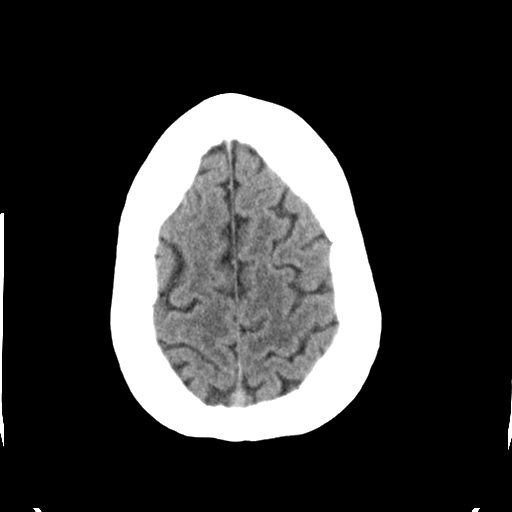
[im 31/34  brain]
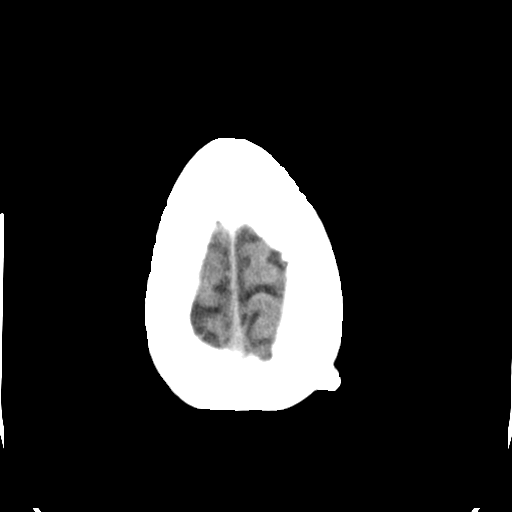
[im 31/34  bone]
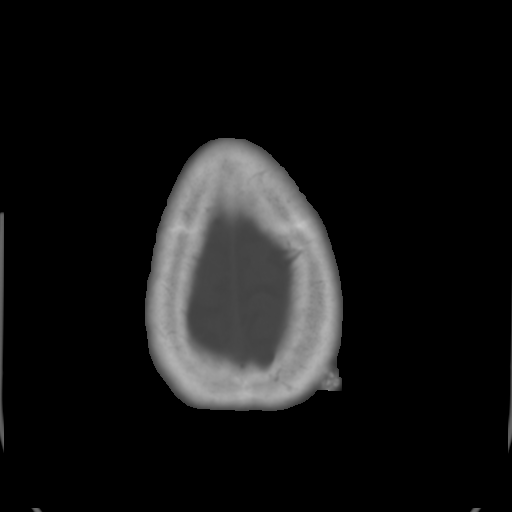

[Series 5: head 3.0 cor st · coronal · 0.33mm/px · 3 of 69 slices shown]
[im 23/69  brain]
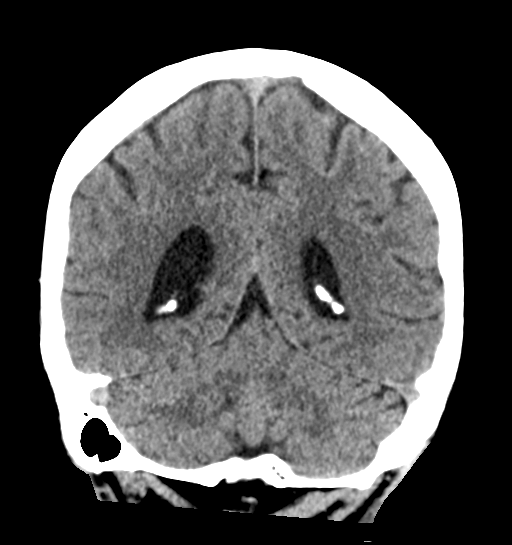
[im 31/69  brain]
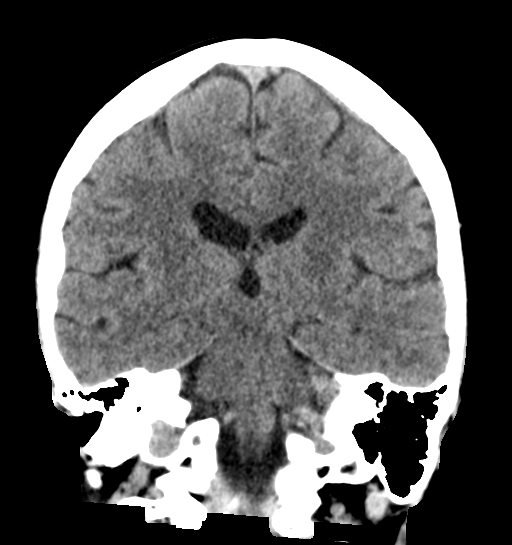
[im 38/69  brain]
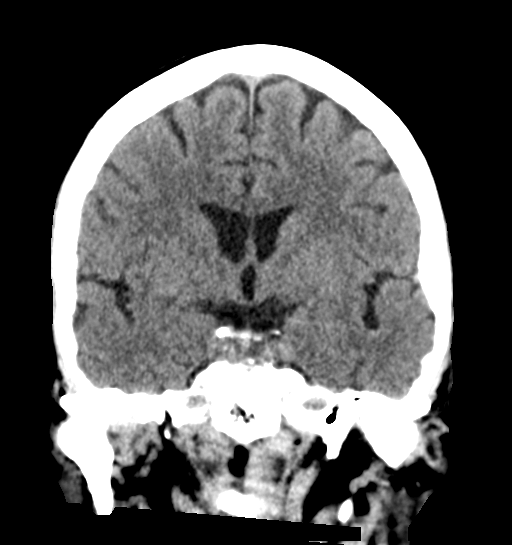

[Series 6: head 3.0 sag st · sagittal · 0.34mm/px · 3 of 67 slices shown]
[im 23/67  brain]
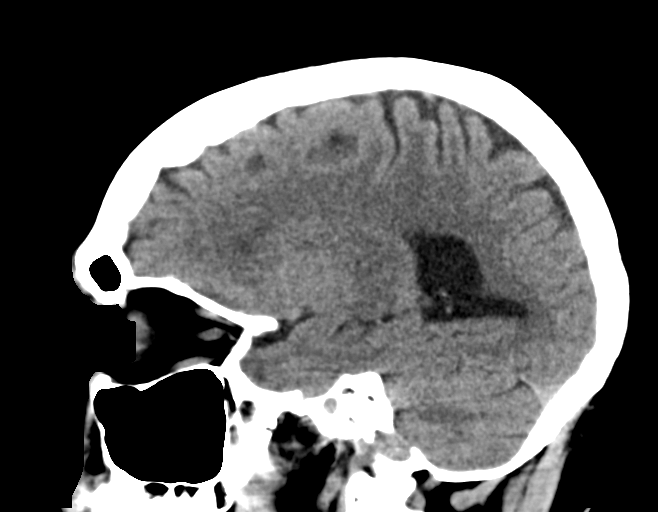
[im 34/67  brain]
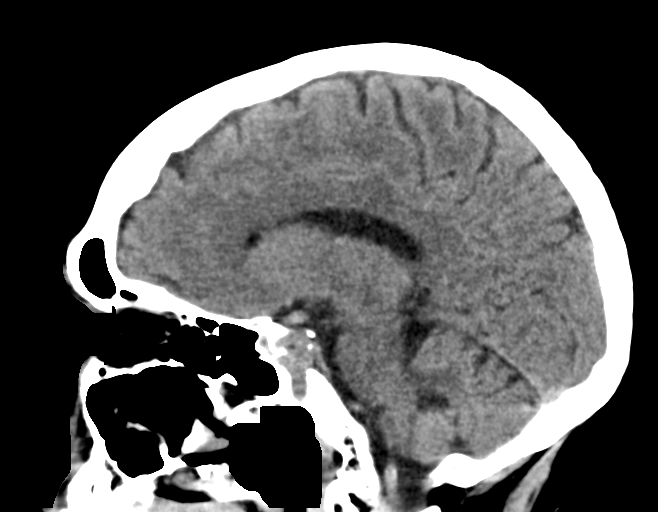
[im 45/67  brain]
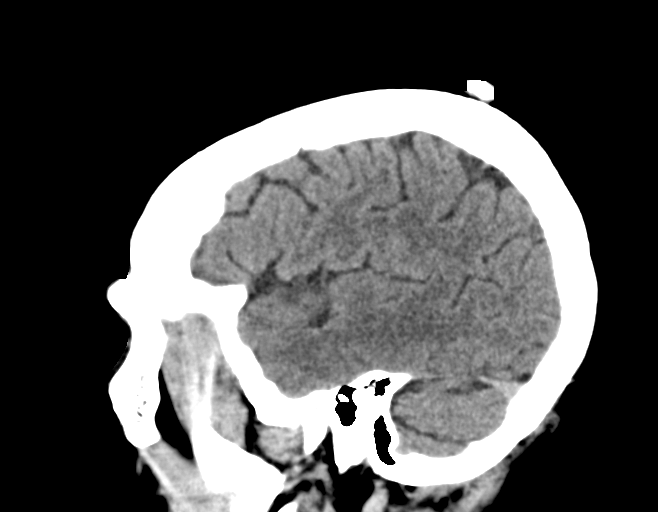

[15 of 47 positions shown; findings below may reference images not displayed]

FINDINGS: Brain: Cerebral volume is within normal limits for age. No midline
shift, ventriculomegaly, mass effect, evidence of mass lesion,
intracranial hemorrhage or evidence of cortically based acute
infarction. There is mild white matter hypodensity, including
asymmetric left subinsular white matter involvement on series 3,
image 18. No cytotoxic edema identified.

Vascular: Calcified atherosclerosis at the skull base. No suspicious
intracranial vascular hyperdensity.

Skull: Negative.

Sinuses/Orbits: Visualized paranasal sinuses and mastoids are clear.

Other: Partially calcified benign-appearing left vertex scalp
probable sebaceous cyst. Otherwise Visualized orbits and scalp soft
tissues are within normal limits.

ASPECTS ([place]berta Stroke Program Early CT Score)

Total score (0-10 with 10 being normal): 10.
IMPRESSION: 1. Mild for age white matter disease. No acute cortically based
infarct or intracranial hemorrhage identified.
2. ASPECTS 10.
3. Study discussed by telephone with Dr. KVNG on

## 2019-04-15 IMAGING — MR MR HEAD W/O CM
8 of 10 series · 36 of 48 positions shown · non-contrast
Comparison: CT angiogram head/neck [DATE], head CT [DATE].

CLINICAL DATA: Focal neuro deficit, greater than 6 hours, stroke
suspected. Additional history provided: Acute onset speech
difficulty.

EXAM:
MRI HEAD WITHOUT CONTRAST
TECHNIQUE: Multiplanar, multiecho pulse sequences of the brain and surrounding
structures were obtained without intravenous contrast.

[Series 4: DWI · axial · 3.0mm · 1.09mm/px · z∈[-51,+98]mm · 9 of 102 slices shown (1 of 4)]
[im 1/102]
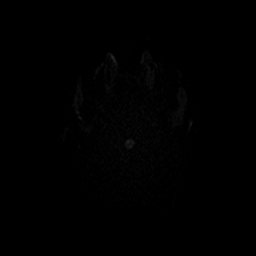
[im 13/102]
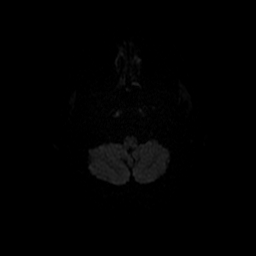
[im 26/102]
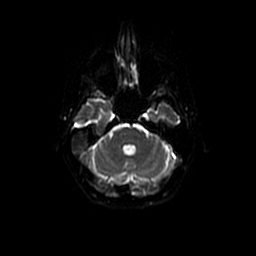
[im 38/102]
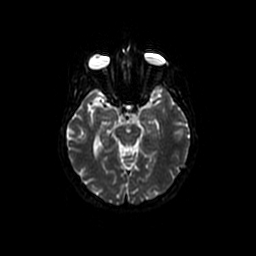
[im 51/102]
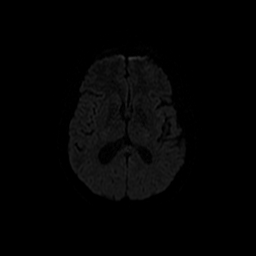
[im 64/102]
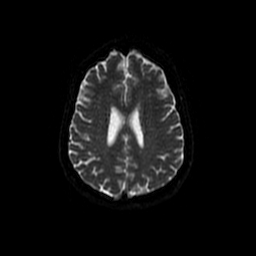
[im 76/102]
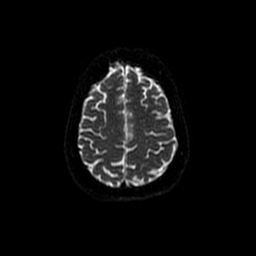
[im 89/102]
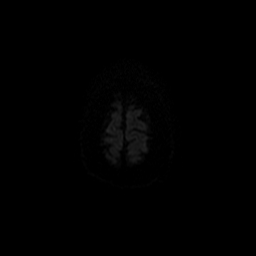
[im 102/102]
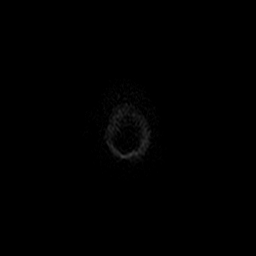

[Series 5: DWI · coronal · 5.0mm · 1.09mm/px · 7 of 74 slices shown (2 of 4)]
[im 1/74]
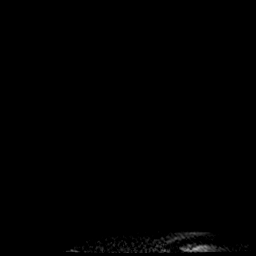
[im 13/74]
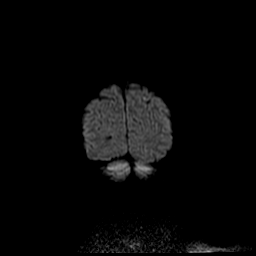
[im 25/74]
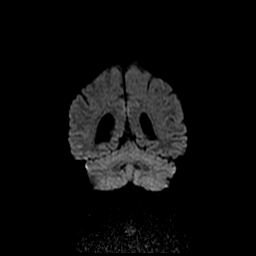
[im 37/74]
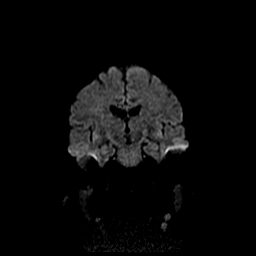
[im 49/74]
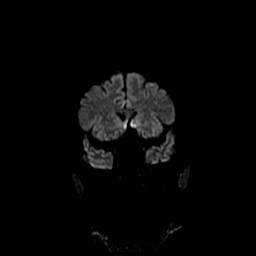
[im 61/74]
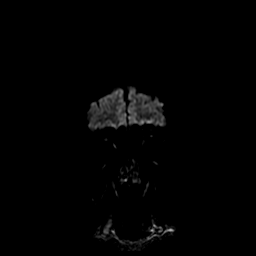
[im 74/74]
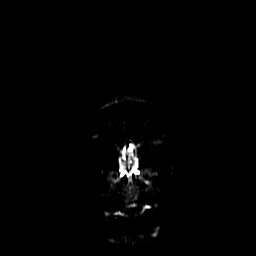

[Series 6: T1 · sagittal · 5.0mm · 0.47mm/px · 2 of 25 slices shown]
[im 1/25]
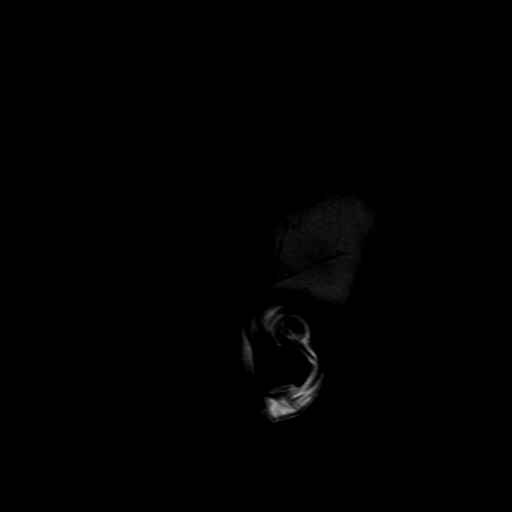
[im 25/25]
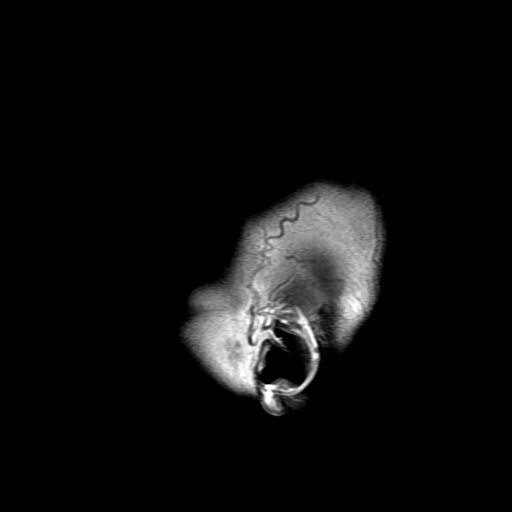

[Series 7: T2 · axial · 5.0mm · 0.43mm/px · z∈[-58,+103]mm · 3 of 28 slices shown (1 of 2)]
[im 1/28]
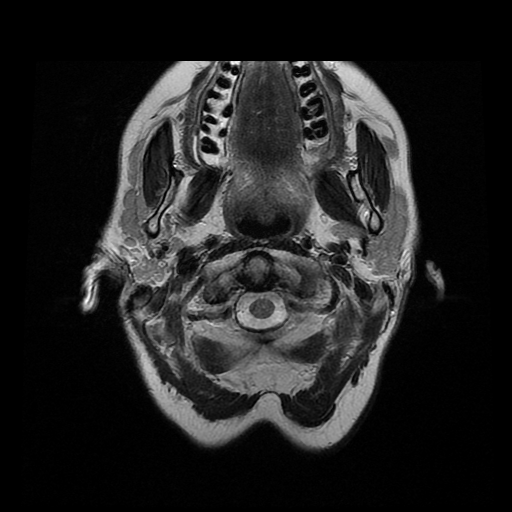
[im 14/28]
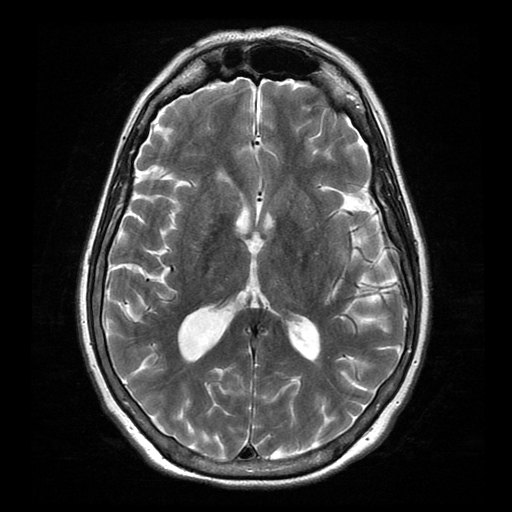
[im 28/28]
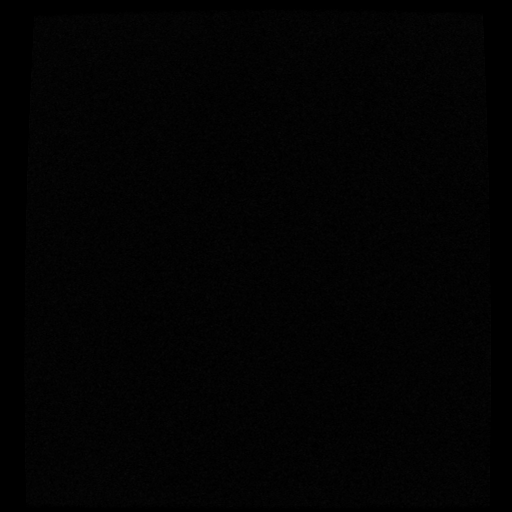

[Series 8: FLAIR · axial · 3.0mm · 0.43mm/px · z∈[-51,+104]mm · 3 of 27 slices shown]
[im 1/27]
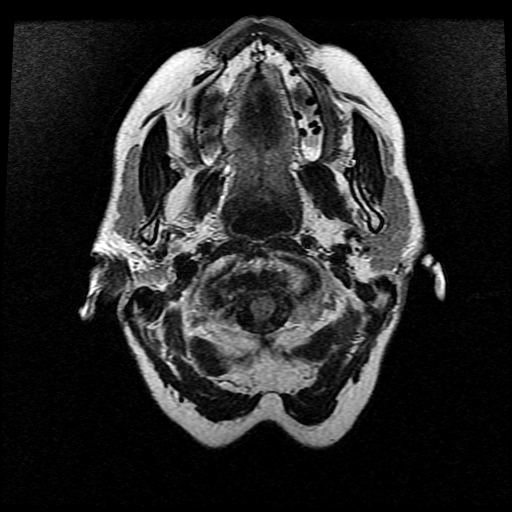
[im 14/27]
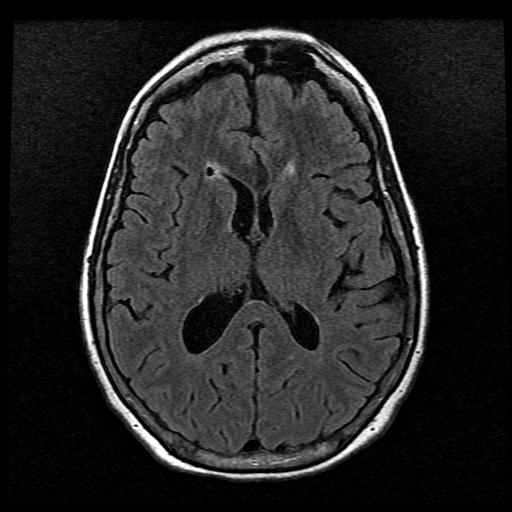
[im 27/27]
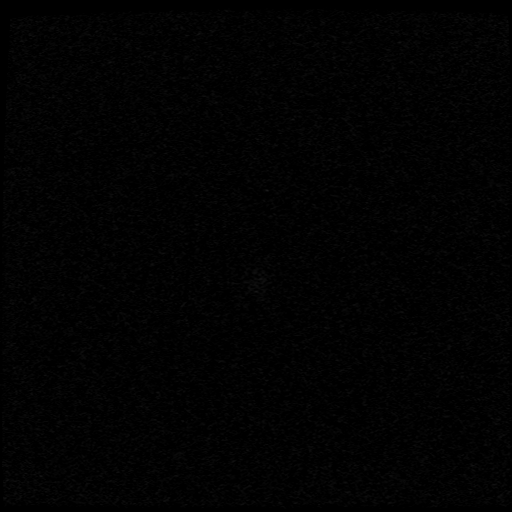

[Series 11: T2 · coronal · 5.0mm · 0.39mm/px · 3 of 28 slices shown (2 of 2)]
[im 1/28]
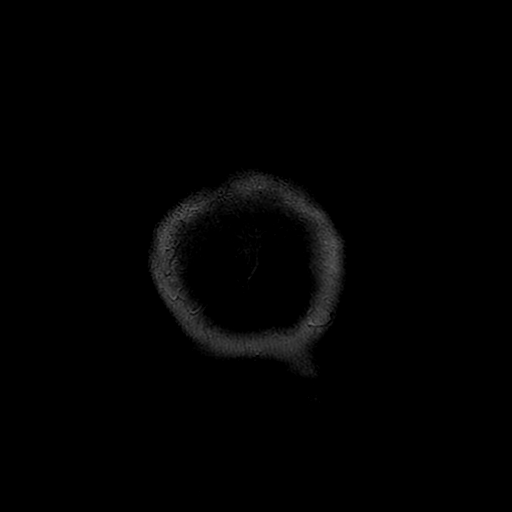
[im 14/28]
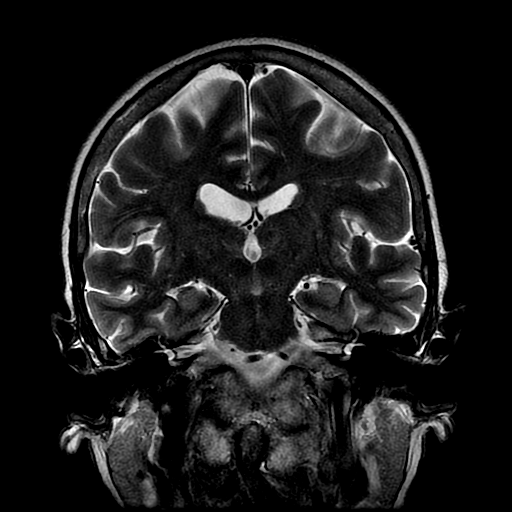
[im 28/28]
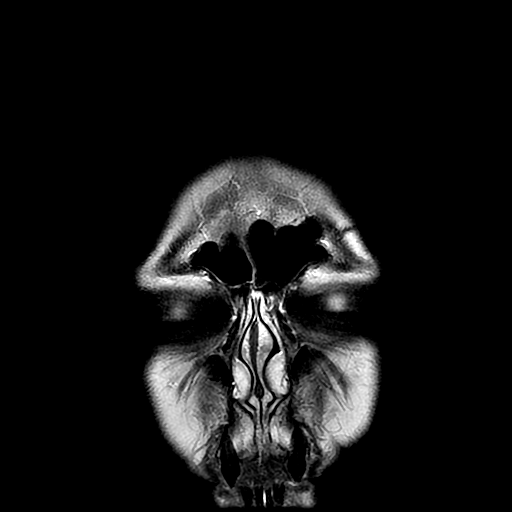

[Series 400: DWI · axial · 3.0mm · 1.09mm/px · z∈[-51,+98]mm · 5 of 51 slices shown (3 of 4)]
[im 1/51]
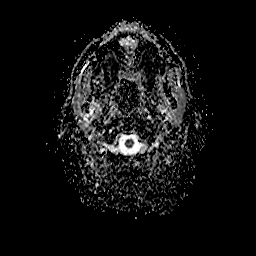
[im 13/51]
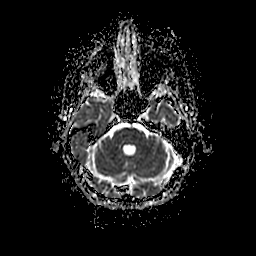
[im 26/51]
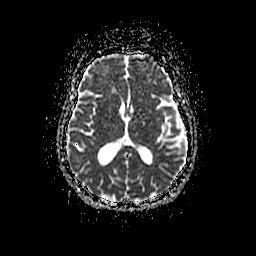
[im 38/51]
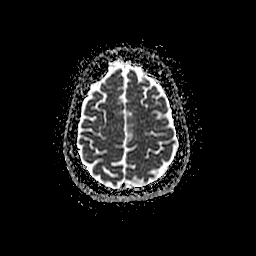
[im 51/51]
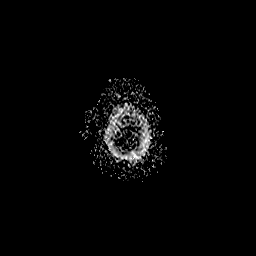

[Series 500: DWI · coronal · 5.0mm · 1.09mm/px · 4 of 37 slices shown (4 of 4)]
[im 1/37]
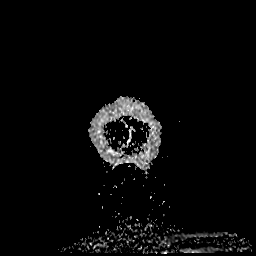
[im 13/37]
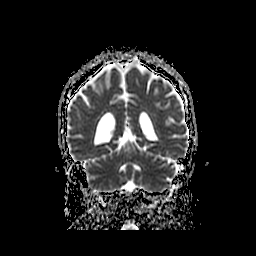
[im 25/37]
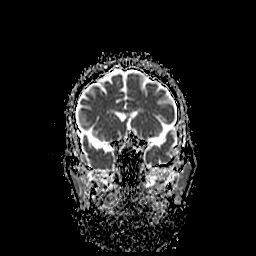
[im 37/37]
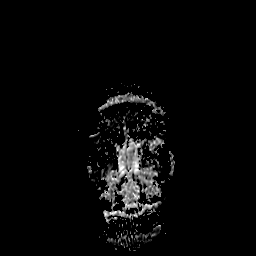

[36 of 48 positions shown; findings below may reference images not displayed]

FINDINGS: Brain:

A punctate focus of diffusion weighted hyperintensity is seen within
the left parietal cortex on both the axial and coronal
diffusion-weighted sequences (series 4, image 35) (series 5, image
7). This focus is too small to accurately characterize on the ADC
map, but may reflect a punctate acute infarct.

No evidence of intracranial mass.

No midline shift or extra-axial fluid collection.

No chronic intracranial blood products.

Minimal scattered T2/FLAIR hyperintensity within the cerebral white
matter is nonspecific, but consistent with chronic small vessel
ischemic disease.

Cerebral volume is normal for age.

Vascular: Flow voids maintained within the proximal large arterial
vessels.

Skull and upper cervical spine: No focal marrow lesion.

Sinuses/Orbits: Visualized orbits demonstrate no acute abnormality.
Minimal ethmoid sinus mucosal thickening. No significant mastoid
effusion.

Other: Partially calcified lesion within the left parietal scalp,
likely reflecting a sebaceous cyst.
IMPRESSION: 1. Punctate acute infarct questioned within the left parietal cortex
as described.
2. Otherwise, no evidence of acute intracranial abnormality.
3. Minimal chronic small vessel ischemic disease.

## 2019-04-15 IMAGING — CT CT ANGIO NECK
2 of 7 series · 8 of 33 positions shown · IV contrast (omnipaque)
Comparison: Plain head CT [51] hours today.

CLINICAL DATA: 68-year-old female code stroke presentation, aphasia
now resolved.

EXAM:
CT ANGIOGRAPHY HEAD AND NECK
TECHNIQUE: Multidetector CT imaging of the head and neck was performed using
the standard protocol during bolus administration of intravenous
contrast. Multiplanar CT image reconstructions and MIPs were
obtained to evaluate the vascular anatomy. Carotid stenosis
measurements (when applicable) are obtained utilizing NASCET
criteria, using the distal internal carotid diameter as the
denominator.
CONTRAST:  75mL OMNIPAQUE IOHEXOL 350 MG/ML SOLN

[Series 5: cta neck · axial · 0.42mm/px · z∈[-231,-109]mm · 2 of 184 slices shown]
[im 62/184  soft-tissue]
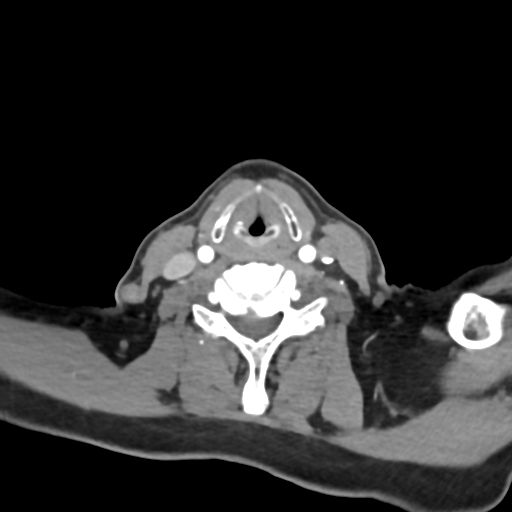
[im 123/184  soft-tissue]
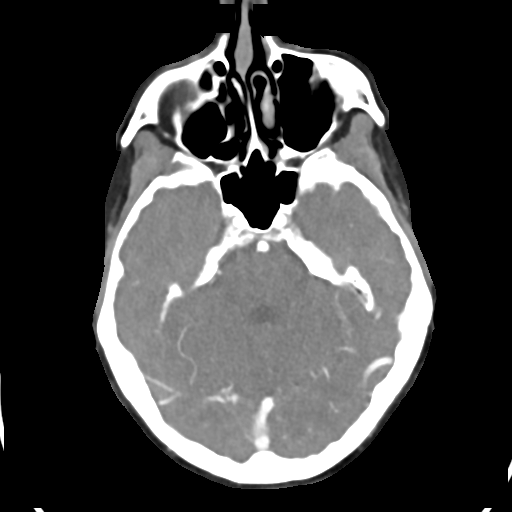

[Series 7: cta neck axial · axial · 0.40mm/px · z∈[-296,-35]mm · 6 of 367 slices shown]
[im 53/367  soft-tissue]
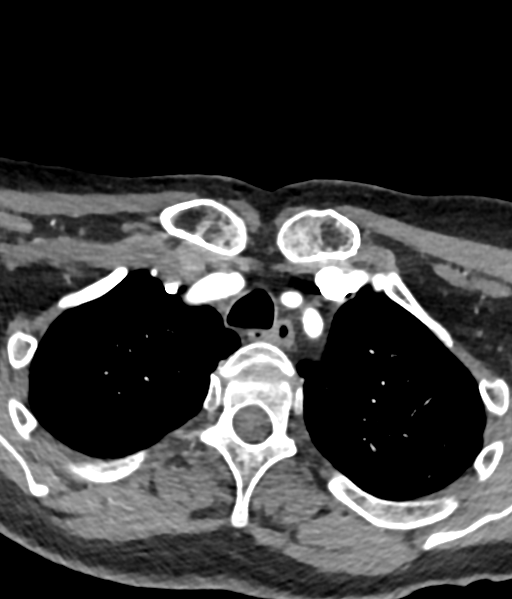
[im 105/367  bone]
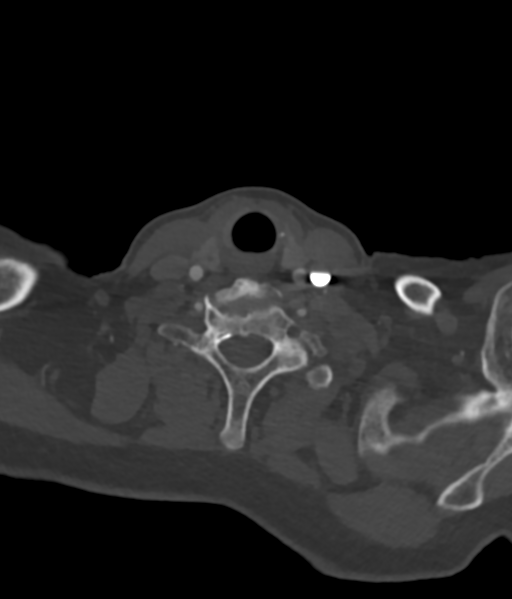
[im 157/367  soft-tissue]
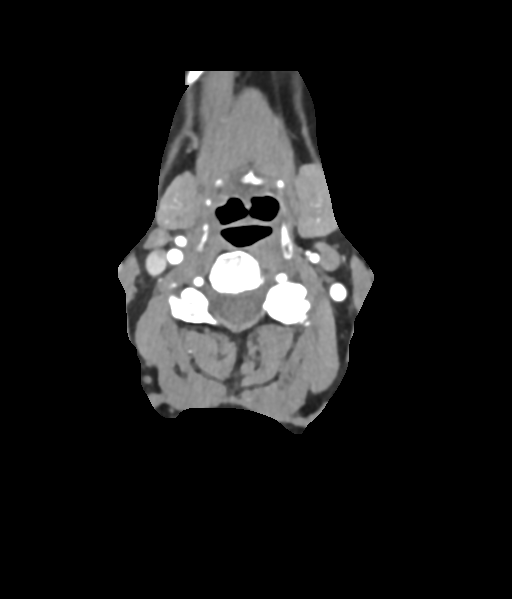
[im 210/367  bone]
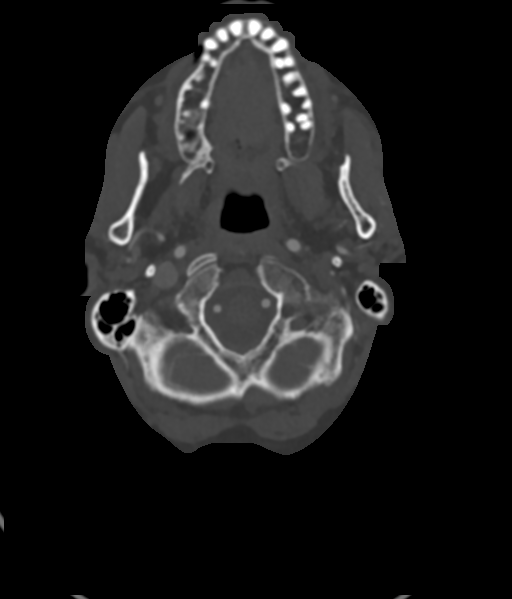
[im 262/367  soft-tissue]
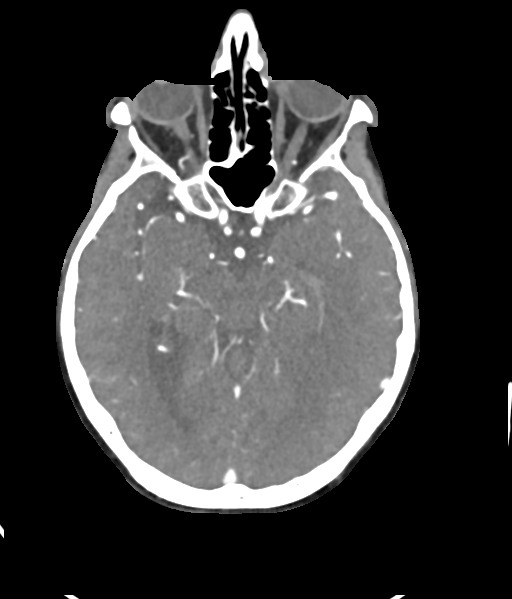
[im 314/367  bone]
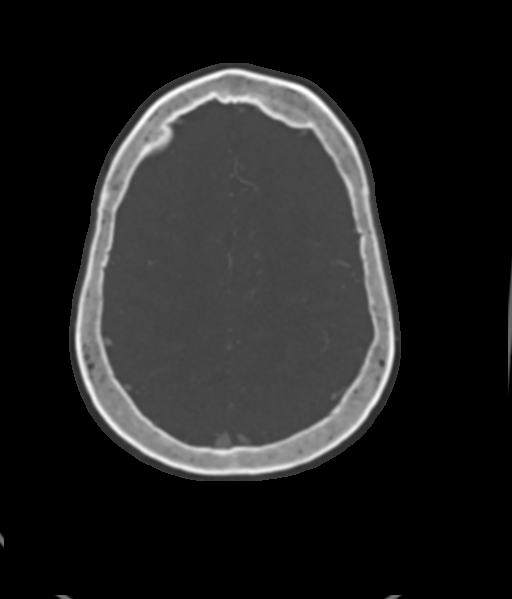

[8 of 33 positions shown; findings below may reference images not displayed]

FINDINGS: CTA NECK

Skeleton: Cervical spine degeneration. No acute osseous abnormality
identified.

Upper chest: Negative.

Other neck: The glottis is closed. Upper limits of normal level 2
cervical lymph nodes, but otherwise negative.

Aortic arch: Calcified aortic atherosclerosis. 3 vessel arch
configuration.

Right carotid system: No brachiocephalic artery or right CCA origin
plaque or stenosis. Negative right carotid bifurcation. Negative
cervical right ICA aside from tortuosity.

Left carotid system: Negative left CCA origin. Negative left carotid
bifurcation. Negative cervical left ICA.

Vertebral arteries:

Minor plaque in the proximal right subclavian artery without
stenosis. Normal right vertebral artery origin. Patent right
vertebral artery to the skull base without stenosis.

Mild plaque in the proximal left subclavian artery without stenosis.
Left vertebral artery origin mildly affected without stenosis
(series 8, image 147). Fairly codominant vertebral arteries.
Tortuous left V2 segment. No left vertebral artery stenosis to the
skull base.

CTA HEAD

Posterior circulation: Patent vertebrobasilar junction with no
distal vertebral artery stenosis. The right AICA appears dominant
with a normal origin. Normal left PICA origin. Patent basilar artery
without stenosis. Mildly ectatic basilar tip, PCA and SCA origins
without stenosis. Posterior communicating arteries are diminutive or
absent. Tortuous P1 segments. Bilateral PCA branches are within
normal limits.

Anterior circulation: Both ICA siphons are patent. Mildly ectatic
intracranial ICAs. No stenosis on the left. Probable cavernous left
ICA infundibulum on series 7, image 111, 1-2 millimeters. Normal
left ophthalmic artery origin. On the right there is siphon ectasia
with mild superimposed calcified plaque. No right siphon stenosis.
Normal ophthalmic artery origin.

Patent carotid termini, MCA and ACA origins. Mildly dominant left
ACA A1. Anterior communicating artery and bilateral ACA branches are
within normal limits. Mildly tortuous M1 segments. Left MCA M1
segment and bifurcation are patent without stenosis. Left MCA
branches are within normal limits. Right MCA M1 segment and
bifurcation are patent without stenosis. Right MCA branches are
within normal limits.

Venous sinuses: Early contrast timing. The superior sagittal sinus,
dominant right transverse and sigmoid sinuses are patent. There is
no enhancement of the left transverse, sigmoid, or IJ bulb but this
is felt to be related to timing.

Anatomic variants: None.

Review of the MIP images confirms the above findings
IMPRESSION: 1. Negative for large vessel occlusion.
2. Arterial ectasia with mild for age atherosclerosis in the head
and neck. No significant stenosis.
3. Ectatic left ICA siphon with 1-2 mm medial out-pouching in the
cavernous segment felt to be a small infundibulum (series 7, image
111).
4. Lack of enhancement of the left transverse and sigmoid sinuses
and left IJ bulb felt due to early contrast timing.
5. Mild calcified aortic atherosclerosis.

## 2019-04-15 MED ORDER — SODIUM CHLORIDE 0.9% FLUSH
3.0000 mL | Freq: Once | INTRAVENOUS | Status: AC
  Administered 2019-04-15: 3 mL via INTRAVENOUS

## 2019-04-15 MED ORDER — IOHEXOL 350 MG/ML SOLN
75.0000 mL | Freq: Once | INTRAVENOUS | Status: AC | PRN
  Administered 2019-04-15: 75 mL via INTRAVENOUS

## 2019-04-15 MED ORDER — SODIUM CHLORIDE 0.9 % IV SOLN
INTRAVENOUS | Status: AC
  Administered 2019-04-15: 19:00:00 via INTRAVENOUS

## 2019-04-15 MED ORDER — ACETAMINOPHEN 650 MG RE SUPP: 650.0000 mg | RECTAL | Status: DC | PRN

## 2019-04-15 MED ORDER — ACETAMINOPHEN 325 MG PO TABS: 650.0000 mg | ORAL_TABLET | ORAL | Status: DC | PRN

## 2019-04-15 MED ORDER — STROKE: EARLY STAGES OF RECOVERY BOOK
Freq: Once | Status: AC
  Administered 2019-04-15: 19:00:00
  Filled 2019-04-15: qty 1

## 2019-04-15 MED ORDER — SENNOSIDES-DOCUSATE SODIUM 8.6-50 MG PO TABS: 1.0000 | ORAL_TABLET | Freq: Every evening | ORAL | Status: DC | PRN

## 2019-04-15 MED ORDER — ACETAMINOPHEN 160 MG/5ML PO SOLN: 650.0000 mg | ORAL | Status: DC | PRN

## 2019-04-15 MED ORDER — ENOXAPARIN SODIUM 40 MG/0.4ML ~~LOC~~ SOLN
40.0000 mg | SUBCUTANEOUS | Status: DC
  Filled 2019-04-15 (×2): qty 0.4

## 2019-04-15 NOTE — H&P (Signed)
History and Physical    Jacqueline Bruce [id number] DOB: 1951/02/13 DOA: 04/15/2019  PCP: Mayra Neer, MD  Patient coming from: [place] via EMS  I have personally briefly reviewed patient's old medical records in [place]  Chief Complaint: Transient loss of clear speech with slurred speech for 20 to 30 minutes this morning  HPI: Jacqueline Bruce is a 68 y.o. female with medical history significant of anxiety for which she takes clonazepam, and osteopenia who presents to the emergency department via [facility].  She was at [place] and had acute onset of not being able to talk.  She knew what she wanted to stay but was unable to get her words out.  Morning where she walked the dog and ate breakfast then went out to [employer].  She had a 20 to 30-minute inability get words out.  On route her symptoms have improved and she has now resolved completely.  During prank transport the patient rapidly improved.  At the bridge she did have 1 or 2 episodes of word finding difficulty.  By the end of her CT scan her symptoms have completely resolved.  She has no other complaints and states she has never had this type of event before.  She takes aspirin once in a while not regularly.  He has no history of hypertension, hypercholesterolemia, diabetes, coronary artery disease, family history of early stroke (father died in his late 44s of a stroke), she is a non-smoker, she is not obese.  She denies any pain, headache, blurry vision or double vision.  She has no associated symptoms.  ED Course: At the bridge by stroke team went rapidly to CT scan which showed no acute abnormalities.  CTA of the head and neck were also unremarkable.  Seen by neurology and referred to me for further evaluation and management  Review of Systems: As per HPI otherwise all other systems reviewed and  negative.   Past Medical History:  Diagnosis Date   Allergy     History reviewed. No pertinent  surgical history.  Social History   Social History Narrative   Not on file     reports that she has never smoked. She has never used smokeless tobacco. She reports that she does not drink alcohol or use drugs.  No Known Allergies  Family History  Problem Relation Age of Onset   Hypertension Mother    Hypertension Father     Prior to Admission medications   Medication Sig Start Date End Date Taking? Authorizing Provider  ALPRAZolam Duanne Moron) 0.25 MG tablet Take 0.25 mg by mouth at bedtime as needed for anxiety.    [provider]  aspirin 81 MG tablet Take 81 mg by mouth daily.    [provider]  calcium carbonate 200 MG capsule Take 250 mg by mouth 2 (two) times daily with a meal.    [provider]  cetirizine (ZYRTEC) 5 MG tablet Take 5 mg by mouth daily.    [provider]  clonazePAM (KLONOPIN) 1 MG tablet Take 1 mg by mouth at bedtime as needed. 04/11/19   [provider]  ferrous fumarate (HEMOCYTE - 106 MG FE) 325 (106 FE) MG TABS tablet Take 1 tablet by mouth.    [provider]  ibandronate (BONIVA) 150 MG tablet Take 150 mg by mouth every 30 (thirty) days. 11/25/18   [provider]  multivitamin-lutein (OCUVITE-LUTEIN) CAPS capsule Take 1 capsule by mouth daily.  [provider]  Omega-3 Fatty Acids (FISH OIL CONCENTRATE PO) Take by mouth.    [provider]  VITAMIN D, ERGOCALCIFEROL, PO Take by mouth.    [provider]    Physical Exam:  Constitutional: NAD, calm, comfortable Vitals:   04/15/19 1304 04/15/19 1305 04/15/19 1306  BP:   (!) 163/67  Pulse:   (!) 51  Resp:   14  Temp:   98.6 F (37 C)  TempSrc:   Oral  SpO2: 100%  100%  Weight:  70.3 kg   Height:  5\' 7"  (1.702 m)    Eyes: PERRL, lids and conjunctivae normal ENMT: Mucous membranes are moist. Posterior pharynx clear of any exudate or lesions.Normal dentition.  Neck: normal, supple, no masses, no  thyromegaly Respiratory: clear to auscultation bilaterally, no wheezing, no crackles. Normal respiratory effort. No accessory muscle use.  Cardiovascular: Bradycardic rate and rhythm, no murmurs / rubs / gallops. No extremity edema. 2+ pedal pulses. No carotid bruits.  Abdomen: no tenderness, no masses palpated. No hepatosplenomegaly. Bowel sounds positive.  Musculoskeletal: no clubbing / cyanosis. No joint deformity upper and lower extremities. Good ROM, no contractures. Normal muscle tone.  Skin: no rashes, lesions, ulcers. No induration Neurologic: CN 2-12 grossly intact. Sensation intact, DTR normal. Strength 5/5 in all 4.  Psychiatric: Normal judgment and insight. Alert and oriented x 3. Normal mood.    Labs on Admission: I have personally reviewed following labs and imaging studies  CBC: Recent Labs  Lab 04/15/19 1240 04/15/19 1252  WBC 6.7  --   NEUTROABS 4.0  --   HGB 14.0 14.6  HCT 42.9 43.0  MCV 92.9  --   PLT 258  --    Basic Metabolic Panel: Recent Labs  Lab 04/15/19 1240 04/15/19 1252  NA 140 136  K 4.1 4.5  CL 107 109  CO2 25  --   GLUCOSE 98 96  BUN 22 29*  CREATININE 1.12* 1.00  CALCIUM 10.5*  --    GFR: Estimated Creatinine Clearance: 53.1 mL/min (by C-G formula based on SCr of 1 mg/dL). Liver Function Tests: Recent Labs  Lab 04/15/19 1240  AST 25  ALT 22  ALKPHOS 58  BILITOT 0.6  PROT 7.3  ALBUMIN 4.2   Coagulation Profile: Recent Labs  Lab 04/15/19 1240  INR 0.9   CBG: Recent Labs  Lab 04/15/19 1240  GLUCAP 79    Radiological Exams on Admission: Ct Angio Head W Or Wo Contrast  Result Date: 04/15/2019 CLINICAL DATA:  68 year old female code stroke presentation, aphasia now resolved. EXAM: CT ANGIOGRAPHY HEAD AND NECK TECHNIQUE: Multidetector CT imaging of the head and neck was performed using the standard protocol during bolus administration of intravenous contrast. Multiplanar CT image reconstructions and MIPs were obtained to  evaluate the vascular anatomy. Carotid stenosis measurements (when applicable) are obtained utilizing NASCET criteria, using the distal internal carotid diameter as the denominator. CONTRAST:  63mL OMNIPAQUE IOHEXOL 350 MG/ML SOLN COMPARISON:  Plain head CT 1245 hours today. FINDINGS: CTA NECK Skeleton: Cervical spine degeneration. No acute osseous abnormality identified. Upper chest: Negative. Other neck: The glottis is closed. Upper limits of normal level 2 cervical lymph nodes, but otherwise negative. Aortic arch: Calcified aortic atherosclerosis. 3 vessel arch configuration. Right carotid system: No brachiocephalic artery or right CCA origin plaque or stenosis. Negative right carotid bifurcation. Negative cervical right ICA aside from tortuosity. Left carotid system: Negative left CCA origin. Negative left carotid bifurcation. Negative cervical left ICA. Vertebral arteries:  Minor plaque in the proximal right subclavian artery without stenosis. Normal right vertebral artery origin. Patent right vertebral artery to the skull base without stenosis. Mild plaque in the proximal left subclavian artery without stenosis. Left vertebral artery origin mildly affected without stenosis (series 8, image 147). Fairly codominant vertebral arteries. Tortuous left V2 segment. No left vertebral artery stenosis to the skull base. CTA HEAD Posterior circulation: Patent vertebrobasilar junction with no distal vertebral artery stenosis. The right AICA appears dominant with a normal origin. Normal left PICA origin. Patent basilar artery without stenosis. Mildly ectatic basilar tip, PCA and SCA origins without stenosis. Posterior communicating arteries are diminutive or absent. Tortuous P1 segments. Bilateral PCA branches are within normal limits. Anterior circulation: Both ICA siphons are patent. Mildly ectatic intracranial ICAs. No stenosis on the left. Probable cavernous left ICA infundibulum on series 7, image 111, 1-2 millimeters.  Normal left ophthalmic artery origin. On the right there is siphon ectasia with mild superimposed calcified plaque. No right siphon stenosis. Normal ophthalmic artery origin. Patent carotid termini, MCA and ACA origins. Mildly dominant left ACA A1. Anterior communicating artery and bilateral ACA branches are within normal limits. Mildly tortuous M1 segments. Left MCA M1 segment and bifurcation are patent without stenosis. Left MCA branches are within normal limits. Right MCA M1 segment and bifurcation are patent without stenosis. Right MCA branches are within normal limits. Venous sinuses: Early contrast timing. The superior sagittal sinus, dominant right transverse and sigmoid sinuses are patent. There is no enhancement of the left transverse, sigmoid, or IJ bulb but this is felt to be related to timing. Anatomic variants: None. Review of the MIP images confirms the above findings IMPRESSION: 1. Negative for large vessel occlusion. 2. 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 No significant stenosis. 3. 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 4. Lack of enhancement of the left transverse and sigmoid sinuses and left IJ bulb felt due to early contrast timing. 5. Mild calcified aortic atherosclerosis. Electronically Signed   By: Genevie Ann M.D.   On: 04/15/2019 13:18   Ct Angio Neck W Or Wo Contrast  Result Date: 04/15/2019 CLINICAL DATA:  68 year old female code stroke presentation, aphasia now resolved. EXAM: CT ANGIOGRAPHY HEAD AND NECK TECHNIQUE: Multidetector CT imaging of the head and neck was performed using the standard protocol during bolus administration of intravenous contrast. Multiplanar CT image reconstructions and MIPs were obtained to evaluate the vascular anatomy. Carotid stenosis measurements (when applicable) are obtained utilizing NASCET criteria, using the distal  internal carotid diameter as the denominator. CONTRAST:  40mL OMNIPAQUE IOHEXOL 350 MG/ML SOLN COMPARISON:  Plain head CT 1245 hours today. FINDINGS: CTA NECK Skeleton: Cervical spine degeneration. No acute osseous abnormality identified. Upper chest: Negative. Other neck: The glottis is closed. Upper limits of normal level 2 cervical lymph nodes, but otherwise negative. Aortic arch: Calcified aortic atherosclerosis. 3 vessel arch configuration. Right carotid system: No brachiocephalic artery or right CCA origin plaque or stenosis. Negative right carotid bifurcation. Negative cervical right ICA aside from tortuosity. Left carotid system: Negative left CCA origin. Negative left carotid bifurcation. Negative cervical left ICA. Vertebral arteries: Minor plaque in the proximal right subclavian artery without stenosis. Normal right vertebral artery origin. Patent right vertebral artery to the skull base without stenosis. Mild plaque in the proximal left subclavian artery without stenosis. Left vertebral artery origin mildly affected without stenosis (  series 8, image 147). Fairly codominant vertebral arteries. Tortuous left V2 segment. No left vertebral artery stenosis to the skull base. CTA HEAD Posterior circulation: Patent vertebrobasilar junction with no distal vertebral artery stenosis. The right AICA appears dominant with a normal origin. Normal left PICA origin. Patent basilar artery without stenosis. Mildly ectatic basilar tip, PCA and SCA origins without stenosis. Posterior communicating arteries are diminutive or absent. Tortuous P1 segments. Bilateral PCA branches are within normal limits. Anterior circulation: Both ICA siphons are patent. Mildly ectatic intracranial ICAs. No stenosis on the left. Probable cavernous left ICA infundibulum on series 7, image 111, 1-2 millimeters. Normal left ophthalmic artery origin. On the right there is siphon ectasia with mild superimposed calcified plaque. No right siphon  stenosis. Normal ophthalmic artery origin. Patent carotid termini, MCA and ACA origins. Mildly dominant left ACA A1. Anterior communicating artery and bilateral ACA branches are within normal limits. Mildly tortuous M1 segments. Left MCA M1 segment and bifurcation are patent without stenosis. Left MCA branches are within normal limits. Right MCA M1 segment and bifurcation are patent without stenosis. Right MCA branches are within normal limits. Venous sinuses: Early contrast timing. The superior sagittal sinus, dominant right transverse and sigmoid sinuses are patent. There is no enhancement of the left transverse, sigmoid, or IJ bulb but this is felt to be related to timing. Anatomic variants: None. Review of the MIP images confirms the above findings IMPRESSION: 1. Negative for large vessel occlusion. 2. 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 No significant stenosis. 3. 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 4. Lack of enhancement of the left transverse and sigmoid sinuses and left IJ bulb felt due to early contrast timing. 5. Mild calcified aortic atherosclerosis. Electronically Signed   By: Genevie Ann M.D.   On: 04/15/2019 13:18   Ct Head Code Stroke Wo Contrast  Result Date: 04/15/2019 CLINICAL DATA:  Code stroke.  68 year old female.  Resolved aphasia. EXAM: CT HEAD WITHOUT CONTRAST TECHNIQUE: Contiguous axial images were obtained from the base of the skull through the vertex without intravenous contrast. COMPARISON:  None. FINDINGS: Brain: Cerebral volume is within normal limits for age. No midline shift, ventriculomegaly, mass effect, evidence of mass lesion, intracranial hemorrhage or evidence of cortically based acute infarction. There is mild white matter hypodensity, including asymmetric left subinsular white matter involvement on series 3, image 18. No cytotoxic edema  identified. Vascular: Calcified atherosclerosis at the skull base. No suspicious intracranial vascular hyperdensity. Skull: Negative. Sinuses/Orbits: Visualized paranasal sinuses and mastoids are clear. Other: Partially calcified benign-appearing left vertex scalp probable sebaceous cyst. Otherwise Visualized orbits and scalp soft tissues are within normal limits. ASPECTS [facility] Stroke Program Early CT Score) Total score (0-10 with 10 being normal): 10. IMPRESSION: 1. 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 No acute cortically based infarct or intracranial hemorrhage identified. 2. ASPECTS 10. 3. Study discussed by telephone with Dr. Roland Rack on 04/15/2019 at 12:56 . Electronically Signed   By: Genevie Ann M.D.   On: 04/15/2019 12:57    EKG: Independently reviewed by me shows sinus bradycardia at 50 bpm no previous EKGs are available for review.  Assessment/Plan Principal Problem:   TIA due to embolism (HCC) Active Problems:   Slurred speech   Bradycardia with 41-50 beats per minute   Osteopenia   Anxiety   1.  TIA due to likely to embolism:  Patient with a bradycardia.  This is concerning for cardiac flow abnormalities.  Will check an echocardiogram to rule out embolism as a possibility.  MRI is ordered and pending.  We will continue aspirin daily.  Resting the patient has no obvious risk factors  2.  Slurred speech now resolved.  Will monitor closely.  Nursing to do a swallow evaluation if stable can start a regular diet.  3.  Bradycardia with 41 to 50 bpm: I was in the patient's room her heart rate was ranging approximately 42-45.  Echocardiogram is ordered and pending.  Will place patient on telemetry overnight.  4.  Osteopenia: Patient takes calcium and multiple other supplements to assist with her osteopenia.  She is also on vitamin D.  Will reorder these once medications have been reconciled.  5.  Anxiety: This is a chronic problem she had been on Xanax but her doctor was concerned  about her taking that regularly and has switched her to Klonopin she believes.  Will restart once medications are reconciled.  DVT prophylaxis: Lovenox Code Status: Full code Family Communication: Patient's husband present at the time of evaluation all questions answered Disposition Plan: Likely home in 24 to 48 hours Consults called: Neurology Admission status: It is my clinical opinion that referral for OBSERVATION is reasonable and necessary in this patient based on the above information provided. The aforementioned taken together are felt to place the patient at high risk for further clinical deterioration. However it is anticipated that the patient may be medically stable for discharge from the hospital within 24 to 48 hours.    Lady Deutscher MD FACP [facility] Pager [phone]  How to contact the [facility] Attending or Consulting provider [place] or covering provider during after hours [employer], for this patient?  1. Check the care team in [facility] and look for a) attending/consulting [facility] provider listed and b) the [facility] team listed 2. Log into [email or web address] and use [place]'s universal password to access. If you do not have the password, please contact the hospital operator. 3. Locate the [facility] provider you are looking for under [facility] and page to a number that you can be directly reached. 4. If you still have difficulty reaching the provider, please page the [facility] (Director on Call) for the Hospitalists listed on amion for assistance.  If 7PM-7AM, please contact night-coverage [email or web address] Password [email or web address]  04/15/2019, 1:52 PM

## 2019-04-15 NOTE — Progress Notes (Signed)
Patient arrived to unit, report received from [employer]. Patient A&Ox4, NIH 0, no pain. Patient educated on belongings policy. Call bell in reach, bed in lowest position, bed alarm on.

## 2019-04-15 NOTE — Significant Event (Signed)
Code Stroke   Arrived via [place]. Patient was at [place] and had acute onset of not being able to talk. She endorsed that she knew what she wanted to say but was unable to get her words out. Upon arrival to bridge, patient still had some mild word finding difficulty but it resolved. NIH 1. CT Head negative for hemorrhage, CTA H/N were done as well.   Plan: - Admit for TIA work-up    Start Time 1234 End Time 1315   Roy Snuffer R

## 2019-04-15 NOTE — Consult Note (Addendum)
Neurology Consultation  Reason for Consult: Code stroke Referring Physician: Gilford Raid  CC: Transient expressive aphasia and word finding difficulties  History is obtained from: Patient  HPI: Jacqueline Bruce is a 68 y.o. female with history of allergies.  Patient states that she woke up went to a hair appointment.  She then went to her appointment at [facility].  At approximately 1140 while at the [employer] appointment she noted difficulties expressing herself.  She knew what she wanted to say, but was unable to get her words out.  It was at that time, EMS was called.  During transport, patient rapidly improved.  While she was at the bridge she did have 1 or 2 episodes of word finding difficulty.  By the end of the CT scan patient had fully resolved.  Patient has no other complaints and states she has never had this type of event occur before.  She states she does take aspirin but once in a while will miss 1 day.   ED course history, physical, CT head, CTA of head and neck  LKW: 1140 on 04/15/2019 tpa given?: no, NIH stroke score of 1 which resolved completely, too good to treat Premorbid modified Rankin scale (mRS): 0 NIH stroke score of 1   Past Medical History:  Diagnosis Date  . Allergy     Family History  Problem Relation Age of Onset  . Hypertension Mother   . Hypertension Father    Social History:   reports that she has never smoked. She has never used smokeless tobacco. She reports that she does not drink alcohol or use drugs.  Medications  Current Facility-Administered Medications:  .  sodium chloride flush (NS) 0.9 % injection 3 mL, 3 mL, Intravenous, Once, Isla Pence, MD  Current Outpatient Medications:  .  ALPRAZolam (XANAX) 0.25 MG tablet, Take 0.25 mg by mouth at bedtime as needed for anxiety., Disp: , Rfl:  .  aspirin 81 MG tablet, Take 81 mg by mouth daily., Disp: , Rfl:  .  calcium carbonate 200 MG capsule, Take 250 mg by mouth 2 (two) times daily with  a meal., Disp: , Rfl:  .  cetirizine (ZYRTEC) 5 MG tablet, Take 5 mg by mouth daily., Disp: , Rfl:  .  ferrous fumarate (HEMOCYTE - 106 MG FE) 325 (106 FE) MG TABS tablet, Take 1 tablet by mouth., Disp: , Rfl:  .  multivitamin-lutein (OCUVITE-LUTEIN) CAPS capsule, Take 1 capsule by mouth daily., Disp: , Rfl:  .  Omega-3 Fatty Acids (FISH OIL CONCENTRATE PO), Take by mouth., Disp: , Rfl:  .  VITAMIN D, ERGOCALCIFEROL, PO, Take by mouth., Disp: , Rfl:    Exam: Current vital signs: BP (!) 163/67 (BP Location: Right Arm)   Pulse (!) 51   Temp 98.6 F (37 C) (Oral)   Resp 14   Ht 5\' 7"  (1.702 m)   Wt 70.3 kg   SpO2 100%   BMI 24.28 kg/m  Vital signs in last 24 hours: Temp:  [98.6 F (37 C)] 98.6 F (37 C) (11/24 1306) Pulse Rate:  [51] 51 (11/24 1306) Resp:  [14] 14 (11/24 1306) BP: (163)/(67) 163/67 (11/24 1306) SpO2:  [100 %] 100 % (11/24 1306) Weight:  [70.3 kg] 70.3 kg (11/24 1305)  ROS:    General ROS: negative for - chills, fatigue, fever, night sweats, weight gain or weight loss Psychological ROS: negative for - behavioral disorder, hallucinations, memory difficulties, mood swings or suicidal ideation Ophthalmic ROS: negative for -  blurry vision, double vision, eye pain or loss of vision ENT ROS: negative for - epistaxis, nasal discharge, oral lesions, sore throat, tinnitus or vertigo Allergy and Immunology ROS: negative for - hives or itchy/watery eyes Hematological and Lymphatic ROS: negative for - bleeding problems, bruising or swollen lymph nodes Endocrine ROS: negative for - galactorrhea, hair pattern changes, polydipsia/polyuria or temperature intolerance Respiratory ROS: negative for - cough, hemoptysis, shortness of breath or wheezing Cardiovascular ROS: negative for - chest pain, dyspnea on exertion, edema or irregular heartbeat Gastrointestinal ROS: negative for - abdominal pain, diarrhea, hematemesis, nausea/vomiting or stool incontinence Genito-Urinary ROS:  negative for - dysuria, hematuria, incontinence or urinary frequency/urgency Musculoskeletal ROS: negative for - joint swelling or muscular weakness Neurological ROS: as noted in HPI Dermatological ROS: negative for rash and skin lesion changes   Physical Exam   Constitutional: Appears well-developed and well-nourished.  Psych: Affect appropriate to situation Eyes: No scleral injection HENT: No OP obstrucion Head: Normocephalic.  Cardiovascular: Normal rate and regular rhythm.  Respiratory: Effort normal, non-labored breathing GI: Soft.  No distension. There is no tenderness.  Skin: WDI  Neuro: Mental Status: Patient is awake, alert, oriented to person, place, month, year, and situation. Patient is able to give a clear and coherent history. No signs of aphasia or neglect Cranial Nerves: II: Visual Fields are full.  III,IV, VI: EOMI without ptosis or diploplia. Pupils equal, round and reactive to light V: Facial sensation is symmetric to temperature VII: Facial movement is symmetric.  VIII: hearing is intact to voice X: Palat elevates symmetrically XI: Shoulder shrug is symmetric. XII: tongue is midline without atrophy or fasciculations.  Motor: Tone is normal. Bulk is normal. 5/5 strength was present in all four extremities.  Sensory: Sensation is symmetric to light touch and temperature in the arms and legs. Deep Tendon Reflexes: 2+ and symmetric in the biceps and patellae.  Plantars: Toes are downgoing bilaterally.  Cerebellar: FNF and HKS are intact bilaterally  Labs I have reviewed labs in epic and the results pertinent to this consultation are:   CBC    Component Value Date/Time   WBC 6.7 04/15/2019 1240   RBC 4.62 04/15/2019 1240   HGB 14.6 04/15/2019 1252   HCT 43.0 04/15/2019 1252   PLT 258 04/15/2019 1240   MCV 92.9 04/15/2019 1240   MCH 30.3 04/15/2019 1240   MCHC 32.6 04/15/2019 1240   RDW 13.2 04/15/2019 1240   LYMPHSABS 2.1 04/15/2019 1240    MONOABS 0.5 04/15/2019 1240   EOSABS 0.1 04/15/2019 1240   BASOSABS 0.0 04/15/2019 1240    CMP     Component Value Date/Time   NA 136 04/15/2019 1252   K 4.5 04/15/2019 1252   CL 109 04/15/2019 1252   CO2 25 04/15/2019 1240   GLUCOSE 96 04/15/2019 1252   BUN 29 (H) 04/15/2019 1252   CREATININE 1.00 04/15/2019 1252   CALCIUM 10.5 (H) 04/15/2019 1240   PROT 7.3 04/15/2019 1240   ALBUMIN 4.2 04/15/2019 1240   AST 25 04/15/2019 1240   ALT 22 04/15/2019 1240   ALKPHOS 58 04/15/2019 1240   BILITOT 0.6 04/15/2019 1240   GFRNONAA 51 (L) 04/15/2019 1240   GFRAA 59 (L) 04/15/2019 1240    Lipid Panel  No results found for: CHOL, TRIG, HDL, CHOLHDL, VLDL, LDLCALC, LDLDIRECT   Imaging I have reviewed the images obtained:  CT-scan of the brain-mild for age white matter disease.  No acute cortical-based infarct or intracranial hemorrhage identified.  CTA head  neck-pending  MRI head-pending  Etta Quill PA-C [facility] [phone]  M-F  (9:00 am- 5:00 PM)  04/15/2019, 1:11 PM    I have seen and evaluated the patient. I have reviewed the above note and made appropriate changes.   Assessment:  68 year old female presenting to the emergency department secondary to transient expressive aphasia/word finding difficulties.  Symptoms rapidly improved.  NIH stroke scale of 1.  Given history she most likely suffered from TIA.    Impression: -TIA -Possible stroke with resolution  Recommend # MRI of the brain without contrast #Transthoracic Echo,  # Start patient on ASA 325mg  daily,  #Start or continue Atorvastatin 80 mg/other high intensity statin # BP goal: permissive HTN upto 220/120 mmHg # HBAIC and Lipid profile # Telemetry monitoring # Frequent neuro checks # NPO until passes stroke swallow screen # please page stroke NP  Or  PA  Or MD from 8am -4 pm  as this patient from this time will be  followed by the stroke.   You can look them up on [email or web address]   Password [email or web address]  Roland Rack, MD [facility] [phone]  If 7pm- 7am, please page neurology on call as listed in [place].

## 2019-04-15 NOTE — ED Notes (Signed)
Patient transported to MRI 

## 2019-04-15 NOTE — Progress Notes (Signed)
  Echocardiogram 2D Echocardiogram has been performed.  Berdene Askari A Elridge Stemm 04/15/2019, 5:21 PM

## 2019-04-15 NOTE — ED Provider Notes (Signed)
[facility] EMERGENCY DEPARTMENT Provider Note   CSN: [id number] Arrival date & time: 04/15/19  1238  An emergency department physician performed an initial assessment on this suspected stroke patient at 1240.  History   Chief Complaint Chief Complaint  Patient presents with   Code Stroke    HPI Jacqueline Bruce is a 68 y.o. female.     Pt presents to the ED today with slurred speech.  A code stroke was called by EMS and pt was met at the bridge upon arrival by myself and the stroke team.  Pt said she was fine today and went to [employer].  While there, she had 20-30 minutes of inability to get the words out.  Sx are improving en route.     Past Medical History:  Diagnosis Date   Allergy     There are no active problems to display for this patient.   History reviewed. No pertinent surgical history.   OB History   No obstetric history on file.      Home Medications    Prior to Admission medications   Medication Sig Start Date End Date Taking? Authorizing Provider  ALPRAZolam Duanne Moron) 0.25 MG tablet Take 0.25 mg by mouth at bedtime as needed for anxiety.    [provider]  aspirin 81 MG tablet Take 81 mg by mouth daily.    [provider]  calcium carbonate 200 MG capsule Take 250 mg by mouth 2 (two) times daily with a meal.    [provider]  cetirizine (ZYRTEC) 5 MG tablet Take 5 mg by mouth daily.    [provider]  ferrous fumarate (HEMOCYTE - 106 MG FE) 325 (106 FE) MG TABS tablet Take 1 tablet by mouth.    [provider]  multivitamin-lutein (OCUVITE-LUTEIN) CAPS capsule Take 1 capsule by mouth daily.    [provider]  Omega-3 Fatty Acids (FISH OIL CONCENTRATE PO) Take by mouth.    [provider]  VITAMIN D, ERGOCALCIFEROL, PO Take by mouth.    [provider]    Family History Family History  Problem Relation Age of Onset   Hypertension Mother     Hypertension Father     Social History Social History   Tobacco Use   Smoking status: Never Smoker   Smokeless tobacco: Never Used  Substance Use Topics   Alcohol use: No   Drug use: No     Allergies   Patient has no known allergies.   Review of Systems Review of Systems  Neurological: Positive for speech difficulty.  All other systems reviewed and are negative.    Physical Exam Updated Vital Signs BP (!) 163/67 (BP Location: Right Arm)    Pulse (!) 51    Temp 98.6 F (37 C) (Oral)    Resp 14    Ht '5\' 7"'  (1.702 m)    Wt 70.3 kg    SpO2 100%    BMI 24.28 kg/m   Physical Exam Vitals signs and nursing note reviewed.  Constitutional:      Appearance: Normal appearance.  HENT:     Head: Normocephalic and atraumatic.     Right Ear: External ear normal.     Left Ear: External ear normal.     Nose: Nose normal.     Mouth/Throat:     Mouth: Mucous membranes are moist.     Pharynx: Oropharynx is clear.  Eyes:     Extraocular Movements: Extraocular movements intact.  Conjunctiva/sclera: Conjunctivae normal.     Pupils: Pupils are equal, round, and reactive to light.  Neck:     Musculoskeletal: Normal range of motion and neck supple.  Cardiovascular:     Rate and Rhythm: Normal rate and regular rhythm.     Pulses: Normal pulses.     Heart sounds: Normal heart sounds.  Pulmonary:     Effort: Pulmonary effort is normal.     Breath sounds: Normal breath sounds.  Abdominal:     General: Abdomen is flat. Bowel sounds are normal.     Palpations: Abdomen is soft.  Musculoskeletal: Normal range of motion.  Skin:    General: Skin is warm.     Capillary Refill: Capillary refill takes less than 2 seconds.  Neurological:     General: No focal deficit present.     Mental Status: She is alert and oriented to person, place, and time.  Psychiatric:        Mood and Affect: Mood normal.        Behavior: Behavior normal.        Thought Content: Thought content normal.         Judgment: Judgment normal.      ED Treatments / Results  Labs (all labs ordered are listed, but only abnormal results are displayed) Labs Reviewed  COMPREHENSIVE METABOLIC PANEL - Abnormal; Notable for the following components:      Result Value   Creatinine, Ser 1.12 (*)    Calcium 10.5 (*)    GFR calc non Af Amer 51 (*)    GFR calc Af Amer 59 (*)    All other components within normal limits  I-STAT CHEM 8, ED - Abnormal; Notable for the following components:   BUN 29 (*)    Calcium, Ion 1.13 (*)    All other components within normal limits  SARS CORONAVIRUS 2 (TAT 6-24 HRS)  PROTIME-INR  APTT  CBC  DIFFERENTIAL  CBG MONITORING, ED    EKG EKG Interpretation  Date/Time:  Tuesday April 15 2019 13:09:09 EST Ventricular Rate:  50 PR Interval:    QRS Duration: 100 QT Interval:  443 QTC Calculation: 404 R Axis:   31 Text Interpretation: Sinus rhythm RSR' in V1 or V2, probably normal variant No old tracing to compare Confirmed by Isla Pence [phone]) on 04/15/2019 1:43:24 PM   Radiology Ct Angio Head W Or Wo Contrast  Result Date: 04/15/2019 CLINICAL DATA:  68 year old female code stroke presentation, aphasia now resolved. EXAM: CT ANGIOGRAPHY HEAD AND NECK TECHNIQUE: Multidetector CT imaging of the head and neck was performed using the standard protocol during bolus administration of intravenous contrast. Multiplanar CT image reconstructions and MIPs were obtained to evaluate the vascular anatomy. Carotid stenosis measurements (when applicable) are obtained utilizing NASCET criteria, using the distal internal carotid diameter as the denominator. CONTRAST:  9m OMNIPAQUE IOHEXOL 350 MG/ML SOLN COMPARISON:  Plain head CT 1245 hours today. FINDINGS: CTA NECK Skeleton: Cervical spine degeneration. No acute osseous abnormality identified. Upper chest: Negative. Other neck: The glottis is closed. Upper limits of normal level 2 cervical lymph nodes, but otherwise  negative. Aortic arch: Calcified aortic atherosclerosis. 3 vessel arch configuration. Right carotid system: No brachiocephalic artery or right CCA origin plaque or stenosis. Negative right carotid bifurcation. Negative cervical right ICA aside from tortuosity. Left carotid system: Negative left CCA origin. Negative left carotid bifurcation. Negative cervical left ICA. Vertebral arteries: Minor plaque in the proximal right subclavian artery without stenosis. Normal right vertebral  artery origin. Patent right vertebral artery to the skull base without stenosis. Mild plaque in the proximal left subclavian artery without stenosis. Left vertebral artery origin mildly affected without stenosis (series 8, image 147). Fairly codominant vertebral arteries. Tortuous left V2 segment. No left vertebral artery stenosis to the skull base. CTA HEAD Posterior circulation: Patent vertebrobasilar junction with no distal vertebral artery stenosis. The right AICA appears dominant with a normal origin. Normal left PICA origin. Patent basilar artery without stenosis. Mildly ectatic basilar tip, PCA and SCA origins without stenosis. Posterior communicating arteries are diminutive or absent. Tortuous P1 segments. Bilateral PCA branches are within normal limits. Anterior circulation: Both ICA siphons are patent. Mildly ectatic intracranial ICAs. No stenosis on the left. Probable cavernous left ICA infundibulum on series 7, image 111, 1-2 millimeters. Normal left ophthalmic artery origin. On the right there is siphon ectasia with mild superimposed calcified plaque. No right siphon stenosis. Normal ophthalmic artery origin. Patent carotid termini, MCA and ACA origins. Mildly dominant left ACA A1. Anterior communicating artery and bilateral ACA branches are within normal limits. Mildly tortuous M1 segments. Left MCA M1 segment and bifurcation are patent without stenosis. Left MCA branches are within normal limits. Right MCA M1 segment and  bifurcation are patent without stenosis. Right MCA branches are within normal limits. Venous sinuses: Early contrast timing. The superior sagittal sinus, dominant right transverse and sigmoid sinuses are patent. There is no enhancement of the left transverse, sigmoid, or IJ bulb but this is felt to be related to timing. Anatomic variants: None. Review of the MIP images confirms the above findings IMPRESSION: 1. Negative for large vessel occlusion. 2. 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 No significant stenosis. 3. 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 4. Lack of enhancement of the left transverse and sigmoid sinuses and left IJ bulb felt due to early contrast timing. 5. Mild calcified aortic atherosclerosis. Electronically Signed   By: Genevie Ann M.D.   On: 04/15/2019 13:18   Ct Angio Neck W Or Wo Contrast  Result Date: 04/15/2019 CLINICAL DATA:  68 year old female code stroke presentation, aphasia now resolved. EXAM: CT ANGIOGRAPHY HEAD AND NECK TECHNIQUE: Multidetector CT imaging of the head and neck was performed using the standard protocol during bolus administration of intravenous contrast. Multiplanar CT image reconstructions and MIPs were obtained to evaluate the vascular anatomy. Carotid stenosis measurements (when applicable) are obtained utilizing NASCET criteria, using the distal internal carotid diameter as the denominator. CONTRAST:  27m OMNIPAQUE IOHEXOL 350 MG/ML SOLN COMPARISON:  Plain head CT 1245 hours today. FINDINGS: CTA NECK Skeleton: Cervical spine degeneration. No acute osseous abnormality identified. Upper chest: Negative. Other neck: The glottis is closed. Upper limits of normal level 2 cervical lymph nodes, but otherwise negative. Aortic arch: Calcified aortic atherosclerosis. 3 vessel arch configuration. Right carotid system: No brachiocephalic artery or  right CCA origin plaque or stenosis. Negative right carotid bifurcation. Negative cervical right ICA aside from tortuosity. Left carotid system: Negative left CCA origin. Negative left carotid bifurcation. Negative cervical left ICA. Vertebral arteries: Minor plaque in the proximal right subclavian artery without stenosis. Normal right vertebral artery origin. Patent right vertebral artery to the skull base without stenosis. Mild plaque in the proximal left subclavian artery without stenosis. Left vertebral artery origin mildly affected without stenosis (series 8, image 147). Fairly codominant vertebral arteries. Tortuous left V2 segment. No  left vertebral artery stenosis to the skull base. CTA HEAD Posterior circulation: Patent vertebrobasilar junction with no distal vertebral artery stenosis. The right AICA appears dominant with a normal origin. Normal left PICA origin. Patent basilar artery without stenosis. Mildly ectatic basilar tip, PCA and SCA origins without stenosis. Posterior communicating arteries are diminutive or absent. Tortuous P1 segments. Bilateral PCA branches are within normal limits. Anterior circulation: Both ICA siphons are patent. Mildly ectatic intracranial ICAs. No stenosis on the left. Probable cavernous left ICA infundibulum on series 7, image 111, 1-2 millimeters. Normal left ophthalmic artery origin. On the right there is siphon ectasia with mild superimposed calcified plaque. No right siphon stenosis. Normal ophthalmic artery origin. Patent carotid termini, MCA and ACA origins. Mildly dominant left ACA A1. Anterior communicating artery and bilateral ACA branches are within normal limits. Mildly tortuous M1 segments. Left MCA M1 segment and bifurcation are patent without stenosis. Left MCA branches are within normal limits. Right MCA M1 segment and bifurcation are patent without stenosis. Right MCA branches are within normal limits. Venous sinuses: Early contrast timing. The superior  sagittal sinus, dominant right transverse and sigmoid sinuses are patent. There is no enhancement of the left transverse, sigmoid, or IJ bulb but this is felt to be related to timing. Anatomic variants: None. Review of the MIP images confirms the above findings IMPRESSION: 1. Negative for large vessel occlusion. 2. 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 No significant stenosis. 3. 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 4. Lack of enhancement of the left transverse and sigmoid sinuses and left IJ bulb felt due to early contrast timing. 5. Mild calcified aortic atherosclerosis. Electronically Signed   By: Genevie Ann M.D.   On: 04/15/2019 13:18   Ct Head Code Stroke Wo Contrast  Result Date: 04/15/2019 CLINICAL DATA:  Code stroke.  68 year old female.  Resolved aphasia. EXAM: CT HEAD WITHOUT CONTRAST TECHNIQUE: Contiguous axial images were obtained from the base of the skull through the vertex without intravenous contrast. COMPARISON:  None. FINDINGS: Brain: Cerebral volume is within normal limits for age. No midline shift, ventriculomegaly, mass effect, evidence of mass lesion, intracranial hemorrhage or evidence of cortically based acute infarction. There is mild white matter hypodensity, including asymmetric left subinsular white matter involvement on series 3, image 18. No cytotoxic edema identified. Vascular: Calcified atherosclerosis at the skull base. No suspicious intracranial vascular hyperdensity. Skull: Negative. Sinuses/Orbits: Visualized paranasal sinuses and mastoids are clear. Other: Partially calcified benign-appearing left vertex scalp probable sebaceous cyst. Otherwise Visualized orbits and scalp soft tissues are within normal limits. ASPECTS [facility] Stroke Program Early CT Score) Total score (0-10 with 10 being normal): 10. IMPRESSION: 1. Mild for age white matter  disease. No acute cortically based infarct or intracranial hemorrhage identified. 2. ASPECTS 10. 3. Study discussed by telephone with Dr. Roland Rack on 04/15/2019 at 12:56 . Electronically Signed   By: Genevie Ann M.D.   On: 04/15/2019 12:57    Procedures Procedures (including critical care time)  Medications Ordered in ED Medications  sodium chloride flush (NS) 0.9 % injection 3 mL (3 mLs Intravenous Given 04/15/19 1312)  iohexol (OMNIPAQUE) 350 MG/ML injection 75 mL (75 mLs Intravenous Contrast Given 04/15/19 1304)     Initial Impression / Assessment and Plan / ED Course  I have reviewed the triage vital signs and the nursing notes.  Pertinent labs &  imaging results that were available during my care of the patient were reviewed by me and considered in my medical decision making (see chart for details).    Pt d/w Dr. Leonel Ramsay (neurology).  He recommends admission for TIA/stroke work up.  Pt d/w Dr. Evangeline Gula ([facility]) who will admit.  Final Clinical Impressions(s) / ED Diagnoses   Final diagnoses:  TIA (transient ischemic attack)    ED Discharge Orders    None       Isla Pence, MD 04/15/19 1343

## 2019-04-15 NOTE — Progress Notes (Signed)
PT Cancellation Note  Patient Details Name: Jacqueline Bruce MRN: [id number] DOB: [birth year]   Cancelled Treatment:    Reason Eval/Treat Not Completed: Patient at procedure or test/unavailable (with echo). Will follow-up for PT evaluation as schedule permits.  Mabeline Caras, PT, DPT Acute Rehabilitation Services  Pager [phone] Office [place] 04/15/2019, 5:00 PM

## 2019-04-15 NOTE — Progress Notes (Signed)
  Echocardiogram 2D Echocardiogram was attempted but patient was in MRI.    Jennette Dubin 04/15/2019, 2:23 PM

## 2019-04-15 NOTE — ED Triage Notes (Signed)
Pt BIB [facility] from [employer]. Pt complaining of sudden onset difficulty getting her words out for 20-30 min. No other deficit. Pt reports no history of falling.

## 2019-04-15 NOTE — ED Notes (Addendum)
ED TO INPATIENT HANDOFF REPORT  ED Nurse Name and Phone #: Thurmond Butts Crocker Name/Age/Gender Dory Larsen 68 y.o. female Room/Bed: 032C/032C  Code Status   Code Status: Full Code  Home/SNF/Other Home Patient oriented to: self, place, time and situation Is this baseline? Yes   Triage Complete: Triage complete  Chief Complaint code stroke  Triage Note Pt BIB [facility]. Pt complaining of sudden onset difficulty getting her words out for 20-30 min. No other deficit. Pt reports no history of falling.     Allergies No Known Allergies  Level of Care/Admitting Diagnosis ED Disposition    ED Disposition Condition [facility] Area: [place] [[id number]]  Level of Care: Telemetry Cardiac [103]  I expect the patient will be discharged within 24 hours: Yes  LOW acuity---Tx typically complete <24 hrs---ACUTE conditions typically can be evaluated <24 hours---LABS likely to return to acceptable levels <24 hours---IS near functional baseline---EXPECTED to return to current living arrangement---NOT newly hypoxic: Meets criteria for 5C-Observation unit  Covid Evaluation: Asymptomatic Screening Protocol (No Symptoms)  Diagnosis: TIA due to embolism [facility]) [id number]  Admitting Physician: Lady Deutscher [id number]  Attending Physician: Lady Deutscher [phone]  PT Class (Do Not Modify): Observation [104]  PT Acc Code (Do Not Modify): Observation [10022]       B Medical/Surgery History Past Medical History:  Diagnosis Date  . Allergy    History reviewed. No pertinent surgical history.   A IV Location/Drains/Wounds Patient Lines/Drains/Airways Status   Active Line/Drains/Airways    Name:   Placement date:   Placement time:   Site:   Days:   Peripheral IV 04/15/19 Left Antecubital   04/15/19    1309    Antecubital   less than 1          Intake/Output Last 24 hours No intake or output data in the 24 hours ending 04/15/19  1400  Labs/Imaging Results for orders placed or performed during the hospital encounter of 04/15/19 (from the past 48 hour(s))  Protime-INR     Status: None   Collection Time: 04/15/19 12:40 PM  Result Value Ref Range   Prothrombin Time 12.5 11.4 - 15.2 seconds   INR 0.9 0.8 - 1.2    Comment: (NOTE) INR goal varies based on device and disease states. Performed at [facility], [place] [address] [place], [place] [address]   APTT     Status: None   Collection Time: 04/15/19 12:40 PM  Result Value Ref Range   aPTT 28 24 - 36 seconds    Comment: Performed at [place] [address], [place] [address]  CBC     Status: None   Collection Time: 04/15/19 12:40 PM  Result Value Ref Range   WBC 6.7 4.0 - 10.5 K/uL   RBC 4.62 3.87 - 5.11 MIL/uL   Hemoglobin 14.0 12.0 - 15.0 g/dL   HCT 42.9 36.0 - 46.0 %   MCV 92.9 80.0 - 100.0 fL   MCH 30.3 26.0 - 34.0 pg   MCHC 32.6 30.0 - 36.0 g/dL   RDW 13.2 11.5 - 15.5 %   Platelets 258 150 - 400 K/uL   nRBC 0.0 0.0 - 0.2 %    Comment: Performed at [facility], [place] [address], [place], [place] [address]  Differential     Status: None   Collection Time: 04/15/19 12:40 PM  Result Value Ref Range  Neutrophils Relative % 59 %   Neutro Abs 4.0 1.7 - 7.7 K/uL   Lymphocytes Relative 31 %   Lymphs Abs 2.1 0.7 - 4.0 K/uL   Monocytes Relative 8 %   Monocytes Absolute 0.5 0.1 - 1.0 K/uL   Eosinophils Relative 2 %   Eosinophils Absolute 0.1 0.0 - 0.5 K/uL   Basophils Relative 0 %   Basophils Absolute 0.0 0.0 - 0.1 K/uL   Immature Granulocytes 0 %   Abs Immature Granulocytes 0.02 0.00 - 0.07 K/uL    Comment: Performed at [place] [address], [place], [place] [address]  Comprehensive metabolic panel     Status: Abnormal   Collection Time: 04/15/19 12:40 PM  Result Value Ref Range   Sodium 140 135 - 145 mmol/L   Potassium 4.1 3.5 - 5.1 mmol/L   Chloride 107 98 - 111 mmol/L   CO2 25 22 - 32 mmol/L    Glucose, Bld 98 70 - 99 mg/dL   BUN 22 8 - 23 mg/dL   Creatinine, Ser 1.12 (H) 0.44 - 1.00 mg/dL   Calcium 10.5 (H) 8.9 - 10.3 mg/dL   Total Protein 7.3 6.5 - 8.1 g/dL   Albumin 4.2 3.5 - 5.0 g/dL   AST 25 15 - 41 U/L   ALT 22 0 - 44 U/L   Alkaline Phosphatase 58 38 - 126 U/L   Total Bilirubin 0.6 0.3 - 1.2 mg/dL   GFR calc non Af Amer 51 (L) >60 mL/min   GFR calc Af Amer 59 (L) >60 mL/min   Anion gap 8 5 - 15    Comment: Performed at [place] [address], [place], [place] [address]  CBG monitoring, ED     Status: None   Collection Time: 04/15/19 12:40 PM  Result Value Ref Range   Glucose-Capillary 79 70 - 99 mg/dL  I-stat chem 8, ED     Status: Abnormal   Collection Time: 04/15/19 12:52 PM  Result Value Ref Range   Sodium 136 135 - 145 mmol/L   Potassium 4.5 3.5 - 5.1 mmol/L   Chloride 109 98 - 111 mmol/L   BUN 29 (H) 8 - 23 mg/dL   Creatinine, Ser 1.00 0.44 - 1.00 mg/dL   Glucose, Bld 96 70 - 99 mg/dL   Calcium, Ion 1.13 (L) 1.15 - 1.40 mmol/L   TCO2 24 22 - 32 mmol/L   Hemoglobin 14.6 12.0 - 15.0 g/dL   HCT 43.0 36.0 - 46.0 %   Ct Angio Head W Or Wo Contrast  Result Date: 04/15/2019 CLINICAL DATA:  68 year old female code stroke presentation, aphasia now resolved. EXAM: CT ANGIOGRAPHY HEAD AND NECK TECHNIQUE: Multidetector CT imaging of the head and neck was performed using the standard protocol during bolus administration of intravenous contrast. Multiplanar CT image reconstructions and MIPs were obtained to evaluate the vascular anatomy. Carotid stenosis measurements (when applicable) are obtained utilizing NASCET criteria, using the distal internal carotid diameter as the denominator. CONTRAST:  33mL OMNIPAQUE IOHEXOL 350 MG/ML SOLN COMPARISON:  Plain head CT 1245 hours today. FINDINGS: CTA NECK Skeleton: Cervical spine degeneration. No acute osseous abnormality identified. Upper chest: Negative. Other neck: The glottis is closed. Upper limits of normal level 2  cervical lymph nodes, but otherwise negative. Aortic arch: Calcified aortic atherosclerosis. 3 vessel arch configuration. Right carotid system: No brachiocephalic artery or right CCA origin plaque or stenosis. Negative right carotid bifurcation. Negative cervical right ICA aside from tortuosity. Left  carotid system: Negative left CCA origin. Negative left carotid bifurcation. Negative cervical left ICA. Vertebral arteries: Minor plaque in the proximal right subclavian artery without stenosis. Normal right vertebral artery origin. Patent right vertebral artery to the skull base without stenosis. Mild plaque in the proximal left subclavian artery without stenosis. Left vertebral artery origin mildly affected without stenosis (series 8, image 147). Fairly codominant vertebral arteries. Tortuous left V2 segment. No left vertebral artery stenosis to the skull base. CTA HEAD Posterior circulation: Patent vertebrobasilar junction with no distal vertebral artery stenosis. The right AICA appears dominant with a normal origin. Normal left PICA origin. Patent basilar artery without stenosis. Mildly ectatic basilar tip, PCA and SCA origins without stenosis. Posterior communicating arteries are diminutive or absent. Tortuous P1 segments. Bilateral PCA branches are within normal limits. Anterior circulation: Both ICA siphons are patent. Mildly ectatic intracranial ICAs. No stenosis on the left. Probable cavernous left ICA infundibulum on series 7, image 111, 1-2 millimeters. Normal left ophthalmic artery origin. On the right there is siphon ectasia with mild superimposed calcified plaque. No right siphon stenosis. Normal ophthalmic artery origin. Patent carotid termini, MCA and ACA origins. Mildly dominant left ACA A1. Anterior communicating artery and bilateral ACA branches are within normal limits. Mildly tortuous M1 segments. Left MCA M1 segment and bifurcation are patent without stenosis. Left MCA branches are within normal  limits. Right MCA M1 segment and bifurcation are patent without stenosis. Right MCA branches are within normal limits. Venous sinuses: Early contrast timing. The superior sagittal sinus, dominant right transverse and sigmoid sinuses are patent. There is no enhancement of the left transverse, sigmoid, or IJ bulb but this is felt to be related to timing. Anatomic variants: None. Review of the MIP images confirms the above findings IMPRESSION: 1. Negative for large vessel occlusion. 2. 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 No significant stenosis. 3. 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 4. Lack of enhancement of the left transverse and sigmoid sinuses and left IJ bulb felt due to early contrast timing. 5. Mild calcified aortic atherosclerosis. Electronically Signed   By: Genevie Ann M.D.   On: 04/15/2019 13:18   Ct Angio Neck W Or Wo Contrast  Result Date: 04/15/2019 CLINICAL DATA:  68 year old female code stroke presentation, aphasia now resolved. EXAM: CT ANGIOGRAPHY HEAD AND NECK TECHNIQUE: Multidetector CT imaging of the head and neck was performed using the standard protocol during bolus administration of intravenous contrast. Multiplanar CT image reconstructions and MIPs were obtained to evaluate the vascular anatomy. Carotid stenosis measurements (when applicable) are obtained utilizing NASCET criteria, using the distal internal carotid diameter as the denominator. CONTRAST:  25mL OMNIPAQUE IOHEXOL 350 MG/ML SOLN COMPARISON:  Plain head CT 1245 hours today. FINDINGS: CTA NECK Skeleton: Cervical spine degeneration. No acute osseous abnormality identified. Upper chest: Negative. Other neck: The glottis is closed. Upper limits of normal level 2 cervical lymph nodes, but otherwise negative. Aortic arch: Calcified aortic atherosclerosis. 3 vessel arch configuration. Right carotid  system: No brachiocephalic artery or right CCA origin plaque or stenosis. Negative right carotid bifurcation. Negative cervical right ICA aside from tortuosity. Left carotid system: Negative left CCA origin. Negative left carotid bifurcation. Negative cervical left ICA. Vertebral arteries: Minor plaque in the proximal right subclavian artery without stenosis. Normal right vertebral artery origin. Patent right vertebral artery to the skull base without stenosis. Mild plaque  in the proximal left subclavian artery without stenosis. Left vertebral artery origin mildly affected without stenosis (series 8, image 147). Fairly codominant vertebral arteries. Tortuous left V2 segment. No left vertebral artery stenosis to the skull base. CTA HEAD Posterior circulation: Patent vertebrobasilar junction with no distal vertebral artery stenosis. The right AICA appears dominant with a normal origin. Normal left PICA origin. Patent basilar artery without stenosis. Mildly ectatic basilar tip, PCA and SCA origins without stenosis. Posterior communicating arteries are diminutive or absent. Tortuous P1 segments. Bilateral PCA branches are within normal limits. Anterior circulation: Both ICA siphons are patent. Mildly ectatic intracranial ICAs. No stenosis on the left. Probable cavernous left ICA infundibulum on series 7, image 111, 1-2 millimeters. Normal left ophthalmic artery origin. On the right there is siphon ectasia with mild superimposed calcified plaque. No right siphon stenosis. Normal ophthalmic artery origin. Patent carotid termini, MCA and ACA origins. Mildly dominant left ACA A1. Anterior communicating artery and bilateral ACA branches are within normal limits. Mildly tortuous M1 segments. Left MCA M1 segment and bifurcation are patent without stenosis. Left MCA branches are within normal limits. Right MCA M1 segment and bifurcation are patent without stenosis. Right MCA branches are within normal limits. Venous sinuses:  Early contrast timing. The superior sagittal sinus, dominant right transverse and sigmoid sinuses are patent. There is no enhancement of the left transverse, sigmoid, or IJ bulb but this is felt to be related to timing. Anatomic variants: None. Review of the MIP images confirms the above findings IMPRESSION: 1. Negative for large vessel occlusion. 2. 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 No significant stenosis. 3. 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 4. Lack of enhancement of the left transverse and sigmoid sinuses and left IJ bulb felt due to early contrast timing. 5. Mild calcified aortic atherosclerosis. Electronically Signed   By: Genevie Ann M.D.   On: 04/15/2019 13:18   Ct Head Code Stroke Wo Contrast  Result Date: 04/15/2019 CLINICAL DATA:  Code stroke.  68 year old female.  Resolved aphasia. EXAM: CT HEAD WITHOUT CONTRAST TECHNIQUE: Contiguous axial images were obtained from the base of the skull through the vertex without intravenous contrast. COMPARISON:  None. FINDINGS: Brain: Cerebral volume is within normal limits for age. No midline shift, ventriculomegaly, mass effect, evidence of mass lesion, intracranial hemorrhage or evidence of cortically based acute infarction. There is mild white matter hypodensity, including asymmetric left subinsular white matter involvement on series 3, image 18. No cytotoxic edema identified. Vascular: Calcified atherosclerosis at the skull base. No suspicious intracranial vascular hyperdensity. Skull: Negative. Sinuses/Orbits: Visualized paranasal sinuses and mastoids are clear. Other: Partially calcified benign-appearing left vertex scalp probable sebaceous cyst. Otherwise Visualized orbits and scalp soft tissues are within normal limits. ASPECTS [facility] Stroke Program Early CT Score) Total score (0-10 with 10 being normal): 10.  IMPRESSION: 1. 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 No acute cortically based infarct or intracranial hemorrhage identified. 2. ASPECTS 10. 3. Study discussed by telephone with Dr. Roland Rack on 04/15/2019 at 12:56 . Electronically Signed   By: Genevie Ann M.D.   On: 04/15/2019 12:57    Pending Labs Unresulted Labs (From admission, onward)    Start     Ordered   04/22/19 0500  Creatinine, serum  (enoxaparin (LOVENOX)    CrCl >/= 30 ml/min)  Weekly,   R    Comments: while  on enoxaparin therapy    04/15/19 1351   04/16/19 0500  Hemoglobin A1c  Tomorrow morning,   R     04/15/19 1351   04/16/19 0500  Lipid panel  Tomorrow morning,   R    Comments: Fasting    04/15/19 1351   04/15/19 1349  HIV Antibody (routine testing w rflx)  (HIV Antibody (Routine testing w reflex) panel)  Once,   STAT     04/15/19 1351   04/15/19 1301  SARS CORONAVIRUS 2 (TAT 6-24 HRS) Nasopharyngeal Nasopharyngeal Swab  (Asymptomatic/Tier 3)  Once,   STAT    Question Answer Comment  Is this test for diagnosis or screening Screening   Symptomatic for COVID-19 as defined by CDC No   Hospitalized for COVID-19 No   Admitted to ICU for COVID-19 No   Previously tested for COVID-19 No   Resident in a congregate (group) care setting No   Employed in healthcare setting No   Pregnant No      04/15/19 1300          Vitals/Pain Today's Vitals   04/15/19 1304 04/15/19 1305 04/15/19 1306 04/15/19 1310  BP:   (!) 163/67   Pulse:   (!) 51   Resp:   14   Temp:   98.6 F (37 C)   TempSrc:   Oral   SpO2: 100%  100%   Weight:  70.3 kg    Height:  5\' 7"  (1.702 m)    PainSc:  0-No pain  0-No pain    Isolation Precautions No active isolations  Medications Medications   stroke: mapping our early stages of recovery book (has no administration in time range)  0.9 %  sodium chloride infusion (has no administration in time range)  acetaminophen (TYLENOL) tablet 650 mg (has no administration in time range)    Or   acetaminophen (TYLENOL) 160 MG/5ML solution 650 mg (has no administration in time range)    Or  acetaminophen (TYLENOL) suppository 650 mg (has no administration in time range)  enoxaparin (LOVENOX) injection 40 mg (has no administration in time range)  senna-docusate (Senokot-S) tablet 1 tablet (has no administration in time range)  sodium chloride flush (NS) 0.9 % injection 3 mL (3 mLs Intravenous Given 04/15/19 1312)  iohexol (OMNIPAQUE) 350 MG/ML injection 75 mL (75 mLs Intravenous Contrast Given 04/15/19 1304)    Mobility walks Low fall risk   Focused Assessments    R Recommendations: See Admitting Provider Note  Report given to:  3W RN  Additional Notes:

## 2019-04-16 ENCOUNTER — Inpatient Hospital Stay (HOSPITAL_COMMUNITY): Payer: Medicare Other

## 2019-04-16 ENCOUNTER — Encounter (HOSPITAL_COMMUNITY): Payer: Medicare Other

## 2019-04-16 DIAGNOSIS — I749 Embolism and thrombosis of unspecified artery: Secondary | ICD-10-CM | POA: Diagnosis not present

## 2019-04-16 DIAGNOSIS — R4701 Aphasia: Secondary | ICD-10-CM | POA: Diagnosis present

## 2019-04-16 DIAGNOSIS — I6340 Cerebral infarction due to embolism of unspecified cerebral artery: Secondary | ICD-10-CM | POA: Diagnosis present

## 2019-04-16 DIAGNOSIS — R001 Bradycardia, unspecified: Secondary | ICD-10-CM | POA: Diagnosis present

## 2019-04-16 DIAGNOSIS — I639 Cerebral infarction, unspecified: Secondary | ICD-10-CM

## 2019-04-16 DIAGNOSIS — Z20828 Contact with and (suspected) exposure to other viral communicable diseases: Secondary | ICD-10-CM | POA: Diagnosis present

## 2019-04-16 DIAGNOSIS — Z7982 Long term (current) use of aspirin: Secondary | ICD-10-CM | POA: Diagnosis not present

## 2019-04-16 DIAGNOSIS — E785 Hyperlipidemia, unspecified: Secondary | ICD-10-CM | POA: Diagnosis present

## 2019-04-16 DIAGNOSIS — F419 Anxiety disorder, unspecified: Secondary | ICD-10-CM | POA: Diagnosis present

## 2019-04-16 DIAGNOSIS — G459 Transient cerebral ischemic attack, unspecified: Secondary | ICD-10-CM | POA: Diagnosis present

## 2019-04-16 DIAGNOSIS — I10 Essential (primary) hypertension: Secondary | ICD-10-CM | POA: Diagnosis present

## 2019-04-16 DIAGNOSIS — R29701 NIHSS score 1: Secondary | ICD-10-CM | POA: Diagnosis present

## 2019-04-16 DIAGNOSIS — M8580 Other specified disorders of bone density and structure, unspecified site: Secondary | ICD-10-CM | POA: Diagnosis present

## 2019-04-16 DIAGNOSIS — Z8249 Family history of ischemic heart disease and other diseases of the circulatory system: Secondary | ICD-10-CM | POA: Diagnosis not present

## 2019-04-16 HISTORY — DX: Cerebral infarction, unspecified: I63.9

## 2019-04-16 LAB — LACTATE DEHYDROGENASE: LDH: 116 U/L (ref 98–192)

## 2019-04-16 LAB — HEMOGLOBIN A1C
Hgb A1c MFr Bld: 5.5 % (ref 4.8–5.6)
Mean Plasma Glucose: 111.15 mg/dL

## 2019-04-16 LAB — URIC ACID: Uric Acid, Serum: 5.6 mg/dL (ref 2.5–7.1)

## 2019-04-16 LAB — PHOSPHORUS: Phosphorus: 1.7 mg/dL — ABNORMAL LOW (ref 2.5–4.6)

## 2019-04-16 LAB — HIV ANTIBODY (ROUTINE TESTING W REFLEX): HIV Screen 4th Generation wRfx: NONREACTIVE

## 2019-04-16 LAB — LIPID PANEL
Cholesterol: 212 mg/dL — ABNORMAL HIGH (ref 0–200)
HDL: 82 mg/dL (ref >40)
LDL Cholesterol: 118 mg/dL — ABNORMAL HIGH (ref 0–99)
Total CHOL/HDL Ratio: 2.6 RATIO
Triglycerides: 62 mg/dL (ref <150)
VLDL: 12 mg/dL (ref 0–40)

## 2019-04-16 LAB — MAGNESIUM: Magnesium: 2.3 mg/dL (ref 1.7–2.4)

## 2019-04-16 LAB — TSH: TSH: 2.139 u[IU]/mL (ref 0.350–4.500)

## 2019-04-16 IMAGING — MR MR HEAD W/O CM
9 series · 48 of 48 positions shown · non-contrast
Comparison: [DATE]

CLINICAL DATA: Transient expressive aphasia now resolved, punctate
infarct on prior study

EXAM:
MRI HEAD WITHOUT CONTRAST
TECHNIQUE: Multiplanar, multiecho pulse sequences of the brain and surrounding
structures were obtained without intravenous contrast.

[Series 1: (id) loc ssfse · axial · 5.0mm · 1.02mm/px · z∈[-35,+129]mm · 4 of 33 slices shown]
[im 1/33]
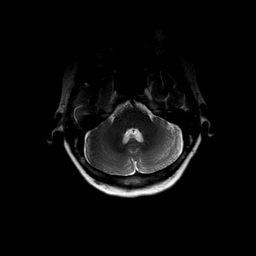
[im 11/33]
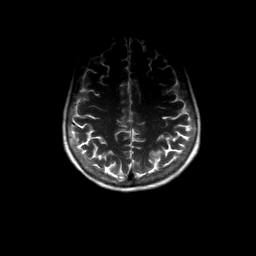
[im 22/33]
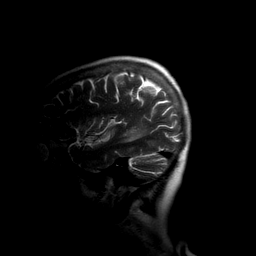
[im 33/33]
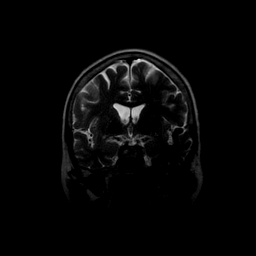

[Series 2: DWI · axial · 5.0mm · 0.94mm/px · z∈[-74,+81]mm · 9 of 64 slices shown]
[im 1/64]
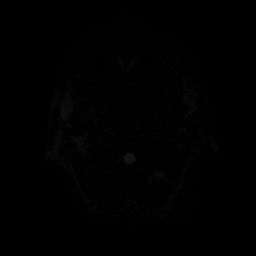
[im 8/64]
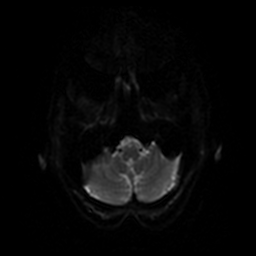
[im 16/64]
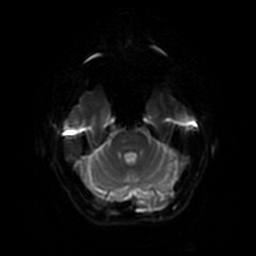
[im 24/64]
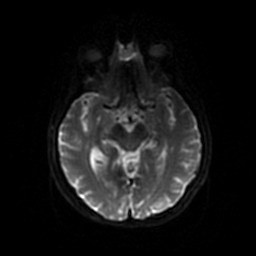
[im 32/64]
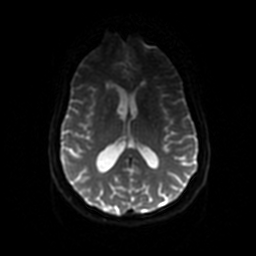
[im 40/64]
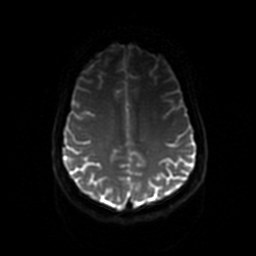
[im 48/64]
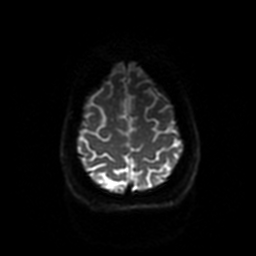
[im 56/64]
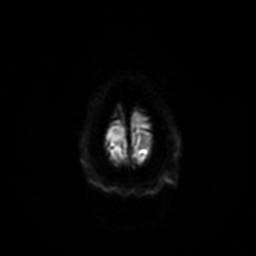
[im 64/64]
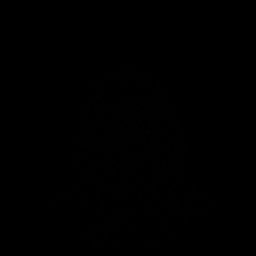

[Series 3: FLAIR · axial · 5.0mm · 0.94mm/px · z∈[-82,+76]mm · 5 of 33 slices shown]
[im 1/33]
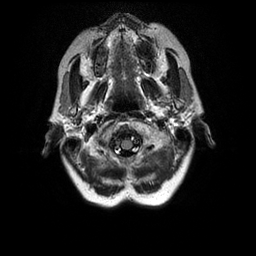
[im 9/33]
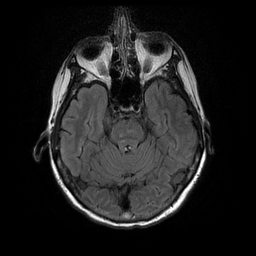
[im 17/33]
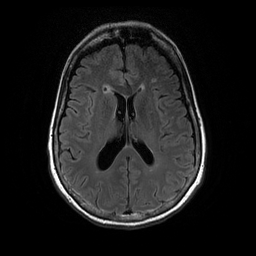
[im 25/33]
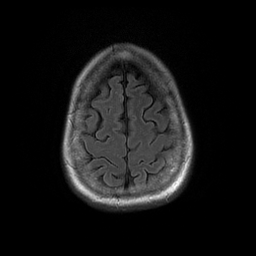
[im 33/33]
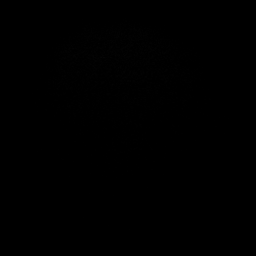

[Series 4: T2-star · axial · 5.0mm · 0.94mm/px · z∈[-82,+76]mm · 5 of 33 slices shown (1 of 2)]
[im 1/33]
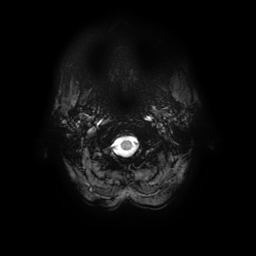
[im 9/33]
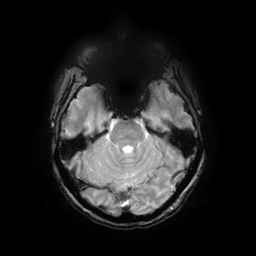
[im 17/33]
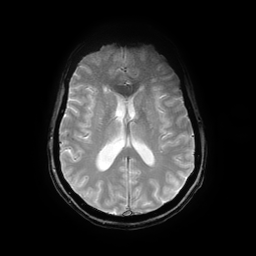
[im 25/33]
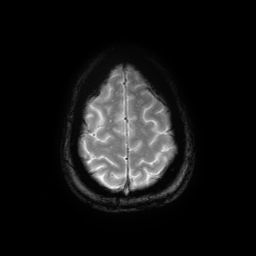
[im 33/33]
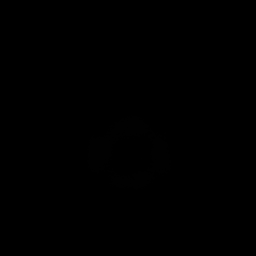

[Series 5: T1 · axial · 5.0mm · 0.94mm/px · z∈[-82,+76]mm · 5 of 33 slices shown (1 of 2)]
[im 1/33]
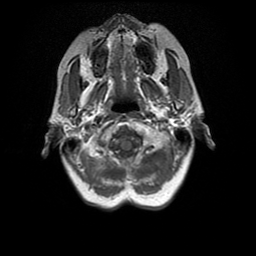
[im 9/33]
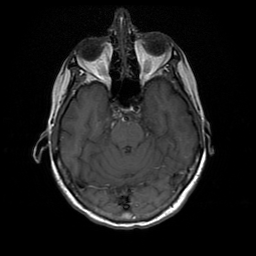
[im 17/33]
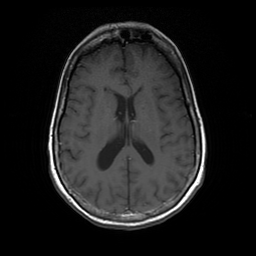
[im 25/33]
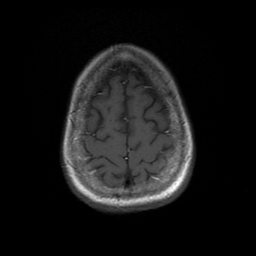
[im 33/33]
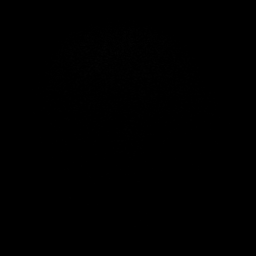

[Series 250: ADC · axial · 5.0mm · 0.94mm/px · z∈[-74,+76]mm · 5 of 31 slices shown]
[im 1/31]
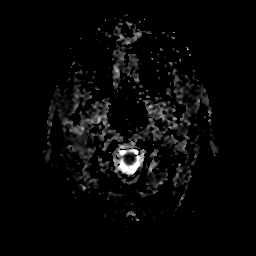
[im 8/31]
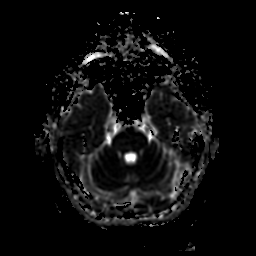
[im 16/31]
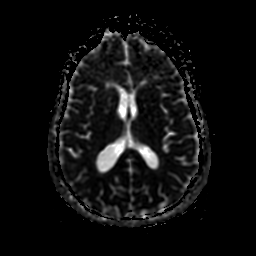
[im 23/31]
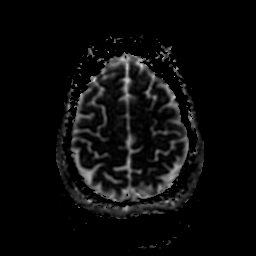
[im 31/31]
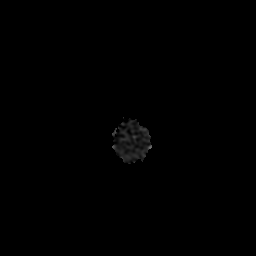

[Series 251: eadc(no-q):(date) (date) est · axial · 5.0mm · 0.94mm/px · z∈[-74,+76]mm · 5 of 31 slices shown]
[im 1/31]
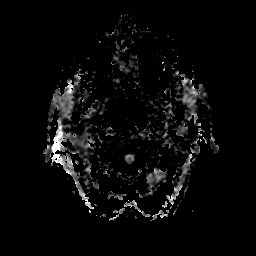
[im 8/31]
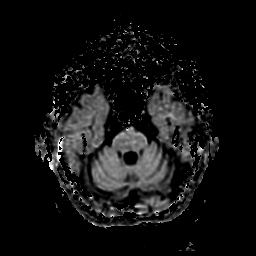
[im 16/31]
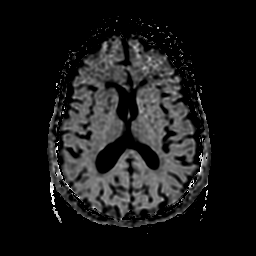
[im 23/31]
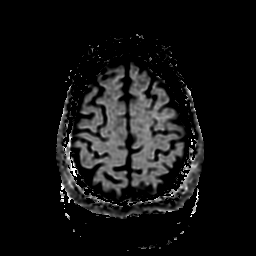
[im 31/31]
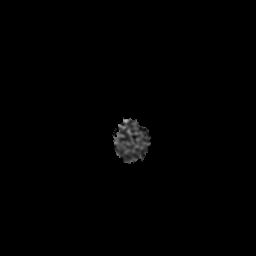

[T2-star · axial · 5.0mm · 0.94mm/px · z∈[-82,+76]mm · 5 of 33 slices shown (2 of 2)]
[im 1/33]
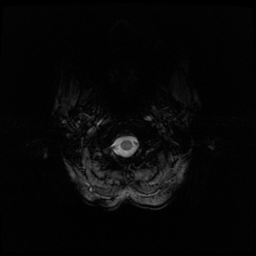
[im 9/33]
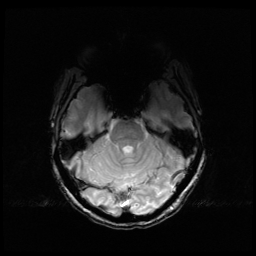
[im 17/33]
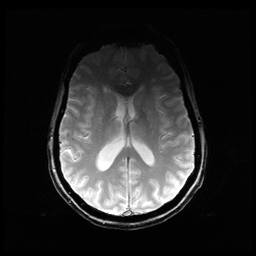
[im 25/33]
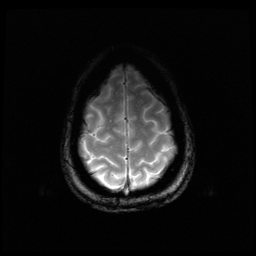
[im 33/33]
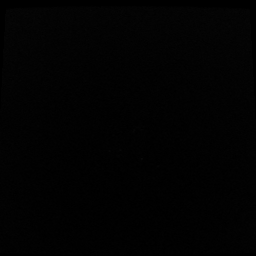

[T1 · axial · 5.0mm · 0.94mm/px · z∈[-82,+76]mm · 5 of 33 slices shown (2 of 2)]
[im 1/33]
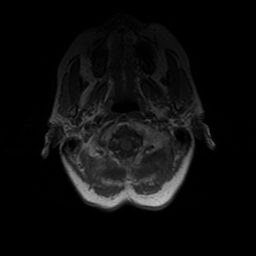
[im 9/33]
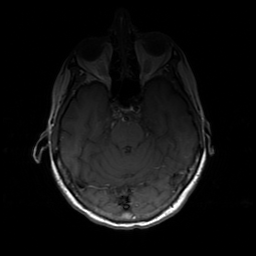
[im 17/33]
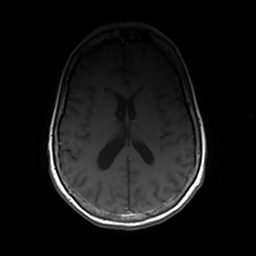
[im 25/33]
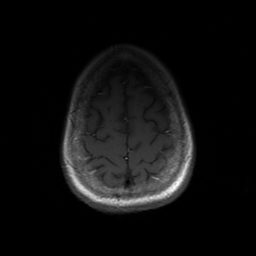
[im 33/33]
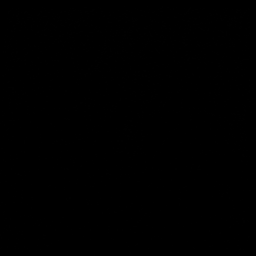

[48 of 48 positions shown; findings below may reference images not displayed]

FINDINGS: Brain: Punctate left parietal cortical focus of diffusion
hyperintensity is again identified. No new abnormal diffusion
signal. Patchy foci of T2 hyperintensity are again identified in the
supratentorial and pontine white matter likely reflecting stable
minor chronic microvascular ischemic changes. Ventricles are stable
in size. No extra-axial collection. No evidence of hemorrhage.

Vascular: Flow voids are preserved at the skull base.

Skull and upper cervical spine: Normal marrow signal.

Sinuses/Orbits: No acute finding.

Other: Left posterior scalp probable sebaceous cyst.
IMPRESSION: Persistent punctate left parietal acute or subacute cortical
infarct. No new findings.

## 2019-04-16 MED ORDER — STUDY - AXIOMATIC STUDY - ASPIRIN 100 MG (PI-SETHI)
100.0000 mg | ORAL_TABLET | Freq: Every day | ORAL | Status: DC
  Administered 2019-04-16 – 2019-04-17 (×2): 100 mg via ORAL
  Filled 2019-04-16 (×2): qty 100

## 2019-04-16 MED ORDER — STUDY - AXIOMATIC STUDY - CLOPIDOGREL 75MG (PI-SETHI)
75.0000 mg | ORAL_TABLET | ORAL | Status: DC
  Administered 2019-04-17: 75 mg via ORAL
  Filled 2019-04-16: qty 75

## 2019-04-16 MED ORDER — STUDY - AXIOMATIC STUDY - CLOPIDOGREL 75MG (PI-SETHI)
300.0000 mg | ORAL_TABLET | Freq: Once | ORAL | Status: AC
  Administered 2019-04-16: 300 mg via ORAL
  Filled 2019-04-16: qty 300

## 2019-04-16 MED ORDER — ASPIRIN 81 MG PO CHEW
81.0000 mg | CHEWABLE_TABLET | Freq: Every day | ORAL | Status: DC
  Administered 2019-04-16: 81 mg via ORAL
  Filled 2019-04-16: qty 1

## 2019-04-16 MED ORDER — STUDY - AXIOMATIC STUDY - BMS986177 OR PLACEBO (PI-SETHI)
2.0000 | ORAL_CAPSULE | Freq: Two times a day (BID) | ORAL | Status: DC
  Administered 2019-04-16 – 2019-04-17 (×2): 2 via ORAL
  Filled 2019-04-16 (×3): qty 2

## 2019-04-16 MED ORDER — CLOPIDOGREL BISULFATE 75 MG PO TABS
75.0000 mg | ORAL_TABLET | Freq: Every day | ORAL | Status: DC
  Administered 2019-04-16: 75 mg via ORAL
  Filled 2019-04-16: qty 1

## 2019-04-16 MED ORDER — ATORVASTATIN CALCIUM 40 MG PO TABS
40.0000 mg | ORAL_TABLET | Freq: Every day | ORAL | Status: DC
  Administered 2019-04-16: 40 mg via ORAL
  Filled 2019-04-16: qty 1

## 2019-04-16 NOTE — Plan of Care (Signed)
  Problem: Clinical Measurements: Goal: Will remain free from infection Outcome: Progressing   Problem: Clinical Measurements: Goal: Ability to maintain clinical measurements within normal limits will improve Outcome: Progressing   Problem: Health Behavior/Discharge Planning: Goal: Ability to manage health-related needs will improve Outcome: Progressing

## 2019-04-16 NOTE — Evaluation (Signed)
Speech Language Pathology Evaluation Patient Details Name: Jacqueline Bruce MRN: [id number] DOB: 02-27-1951 Today's Date: 04/16/2019 Time: [id number] SLP Time Calculation (min) (ACUTE ONLY): 22 min  Problem List:  Patient Active Problem List   Diagnosis Date Noted  . Acute CVA (cerebrovascular accident) ([place]) 04/16/2019  . Slurred speech 04/15/2019  . TIA due to embolism ([place]) 04/15/2019  . Bradycardia with 41-50 beats per minute 04/15/2019  . Osteopenia 04/15/2019  . Anxiety 04/15/2019   Past Medical History:  Past Medical History:  Diagnosis Date  . Allergy    Past Surgical History: History reviewed. No pertinent surgical history. HPI:  Jacqueline Bruce is a 68 y.o. female with medical history significant of anxiety for which she takes clonazepam, and osteopenia who presents with transient loss of clear speech for 20-30 minutes. MRI shwoing possible L parietal cortex stroke.   Assessment / Plan / Recommendation Clinical Impression  Pt was seen for a cognitive-linguistic evaluation in the setting of a possible L parietal CVA, and she presents with functional cognitive-linguistic abilities.  Pt was encountered awake/alert and she was agreeable to ST evaluation.  She reported that she lives at home with her husband and that she is independent with all IADLs (medications, finances, etc.) at baseline.  Pt completed the [facility] Cognitive Assessment [facility]) in addition to informal evaluation measures.   She scored 30/30 on the [facility] which is WNL.  Expressive and receptive language abilities were functional and no dysarthria was observed.  Pt reported a brief period of anomia prior to admission, but stated that it has since resolved.  No further skilled ST is warranted at this time.  Please re-consult if additional needs arise.     SLP Assessment  SLP Recommendation/Assessment: Patient does not need any further Speech Lanaguage Pathology Services SLP Visit Diagnosis: Cognitive communication  deficit (R41.841)    Follow Up Recommendations  None    Frequency and Duration           SLP Evaluation Cognition  Overall Cognitive Status: Within Functional Limits for tasks assessed Arousal/Alertness: Awake/alert Orientation Level: Oriented X4 Attention: Sustained;Alternating Sustained Attention: Appears intact Alternating Attention: Appears intact Memory: Appears intact Awareness: Appears intact Problem Solving: Appears intact Executive Function: Reasoning;Organizing Reasoning: Appears intact Organizing: Appears intact Safety/Judgment: Appears intact       Comprehension  Auditory Comprehension Overall Auditory Comprehension: Appears within functional limits for tasks assessed Yes/No Questions: Within Functional Limits Commands: Within Functional Limits Conversation: Complex Reading Comprehension Reading Status: Within funtional limits    Expression Expression Primary Mode of Expression: Verbal Verbal Expression Overall Verbal Expression: Appears within functional limits for tasks assessed Initiation: No impairment Level of Generative/Spontaneous Verbalization: Conversation Repetition: No impairment Naming: No impairment Pragmatics: No impairment Written Expression Dominant Hand: Right Written Expression: Not tested   Oral / Motor  Oral Motor/Sensory Function Overall Oral Motor/Sensory Function: Within functional limits Motor Speech Overall Motor Speech: Appears within functional limits for tasks assessed   GO                   Colin Mulders M.S., CCC-SLP Acute Rehabilitation Services Office: [phone]  [place] 04/16/2019, 10:38 AM

## 2019-04-16 NOTE — Progress Notes (Signed)
STROKE TEAM PROGRESS NOTE   INTERVAL HISTORY I personally reviewed history of presenting illness and with the patient in details.  Reviewed electronic medical records and imaging films in PACS.  She presented with 2030-minute of transient expressive aphasia which resolved by the time she reached the hospital.  MRI scan shows a tiny punctate left parietal MCA branch infarct which is likely embolic.  CT angiogram of the brain and neck did not reveal any high-grade stenosis but there is calcific changes in the aortic arch.  2D echo is unremarkable.  Overnight telemetry monitoring has been not yet revealed atrial fibrillation.   LDL cholesterol is elevated 118 and hemoglobin A1c is 5.5.  Vitals:   04/16/19 0101 04/16/19 0231 04/16/19 0431 04/16/19 0853  BP:      Pulse: 69   (!) 56  Resp: 20     Temp:  98.8 F (37.1 C) 98.9 F (37.2 C) 98.9 F (37.2 C)  TempSrc:  Oral Oral Oral  SpO2: 100%   100%  Weight:      Height:        CBC:  Recent Labs  Lab 04/15/19 1240 04/15/19 1252  WBC 6.7  --   NEUTROABS 4.0  --   HGB 14.0 14.6  HCT 42.9 43.0  MCV 92.9  --   PLT 258  --     Basic Metabolic Panel:  Recent Labs  Lab 04/15/19 1240 04/15/19 1252  NA 140 136  K 4.1 4.5  CL 107 109  CO2 25  --   GLUCOSE 98 96  BUN 22 29*  CREATININE 1.12* 1.00  CALCIUM 10.5*  --    Lipid Panel:     Component Value Date/Time   CHOL 212 (H) 04/16/2019 0223   TRIG 62 04/16/2019 0223   HDL 82 04/16/2019 0223   CHOLHDL 2.6 04/16/2019 0223   VLDL 12 04/16/2019 0223   LDLCALC 118 (H) 04/16/2019 0223   HgbA1c:  Lab Results  Component Value Date   HGBA1C 5.5 04/16/2019   Urine Drug Screen: No results found for: LABOPIA, COCAINSCRNUR, LABBENZ, AMPHETMU, THCU, LABBARB  Alcohol Level No results found for: ETH  IMAGING Ct Angio Head W Or Wo Contrast  Result Date: 04/15/2019 CLINICAL DATA:  68 year old female code stroke presentation, aphasia now resolved. EXAM: CT ANGIOGRAPHY HEAD AND NECK  TECHNIQUE: Multidetector CT imaging of the head and neck was performed using the standard protocol during bolus administration of intravenous contrast. Multiplanar CT image reconstructions and MIPs were obtained to evaluate the vascular anatomy. Carotid stenosis measurements (when applicable) are obtained utilizing NASCET criteria, using the distal internal carotid diameter as the denominator. CONTRAST:  61mL OMNIPAQUE IOHEXOL 350 MG/ML SOLN COMPARISON:  Plain head CT 1245 hours today. FINDINGS: CTA NECK Skeleton: Cervical spine degeneration. No acute osseous abnormality identified. Upper chest: Negative. Other neck: The glottis is closed. Upper limits of normal level 2 cervical lymph nodes, but otherwise negative. Aortic arch: Calcified aortic atherosclerosis. 3 vessel arch configuration. Right carotid system: No brachiocephalic artery or right CCA origin plaque or stenosis. Negative right carotid bifurcation. Negative cervical right ICA aside from tortuosity. Left carotid system: Negative left CCA origin. Negative left carotid bifurcation. Negative cervical left ICA. Vertebral arteries: Minor plaque in the proximal right subclavian artery without stenosis. Normal right vertebral artery origin. Patent right vertebral artery to the skull base without stenosis. Mild plaque in the proximal left subclavian artery without stenosis. Left vertebral artery origin mildly affected without stenosis (series 8, image 147). Fairly  codominant vertebral arteries. Tortuous left V2 segment. No left vertebral artery stenosis to the skull base. CTA HEAD Posterior circulation: Patent vertebrobasilar junction with no distal vertebral artery stenosis. The right AICA appears dominant with a normal origin. Normal left PICA origin. Patent basilar artery without stenosis. Mildly ectatic basilar tip, PCA and SCA origins without stenosis. Posterior communicating arteries are diminutive or absent. Tortuous P1 segments. Bilateral PCA branches  are within normal limits. Anterior circulation: Both ICA siphons are patent. Mildly ectatic intracranial ICAs. No stenosis on the left. Probable cavernous left ICA infundibulum on series 7, image 111, 1-2 millimeters. Normal left ophthalmic artery origin. On the right there is siphon ectasia with mild superimposed calcified plaque. No right siphon stenosis. Normal ophthalmic artery origin. Patent carotid termini, MCA and ACA origins. Mildly dominant left ACA A1. Anterior communicating artery and bilateral ACA branches are within normal limits. Mildly tortuous M1 segments. Left MCA M1 segment and bifurcation are patent without stenosis. Left MCA branches are within normal limits. Right MCA M1 segment and bifurcation are patent without stenosis. Right MCA branches are within normal limits. Venous sinuses: Early contrast timing. The superior sagittal sinus, dominant right transverse and sigmoid sinuses are patent. There is no enhancement of the left transverse, sigmoid, or IJ bulb but this is felt to be related to timing. Anatomic variants: None. Review of the MIP images confirms the above findings IMPRESSION: 1. Negative for large vessel occlusion. 2. 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 No significant stenosis. 3. 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 4. Lack of enhancement of the left transverse and sigmoid sinuses and left IJ bulb felt due to early contrast timing. 5. Mild calcified aortic atherosclerosis. Electronically Signed   By: Genevie Ann M.D.   On: 04/15/2019 13:18   Ct Angio Neck W Or Wo Contrast  Result Date: 04/15/2019 CLINICAL DATA:  68 year old female code stroke presentation, aphasia now resolved. EXAM: CT ANGIOGRAPHY HEAD AND NECK TECHNIQUE: Multidetector CT imaging of the head and neck was performed using the standard protocol during bolus administration of  intravenous contrast. Multiplanar CT image reconstructions and MIPs were obtained to evaluate the vascular anatomy. Carotid stenosis measurements (when applicable) are obtained utilizing NASCET criteria, using the distal internal carotid diameter as the denominator. CONTRAST:  37mL OMNIPAQUE IOHEXOL 350 MG/ML SOLN COMPARISON:  Plain head CT 1245 hours today. FINDINGS: CTA NECK Skeleton: Cervical spine degeneration. No acute osseous abnormality identified. Upper chest: Negative. Other neck: The glottis is closed. Upper limits of normal level 2 cervical lymph nodes, but otherwise negative. Aortic arch: Calcified aortic atherosclerosis. 3 vessel arch configuration. Right carotid system: No brachiocephalic artery or right CCA origin plaque or stenosis. Negative right carotid bifurcation. Negative cervical right ICA aside from tortuosity. Left carotid system: Negative left CCA origin. Negative left carotid bifurcation. Negative cervical left ICA. Vertebral arteries: Minor plaque in the proximal right subclavian artery without stenosis. Normal right vertebral artery origin. Patent right vertebral artery to the skull base without stenosis. Mild plaque in the proximal left subclavian artery without stenosis. Left vertebral artery origin mildly affected without stenosis (series 8, image 147). Fairly codominant vertebral arteries. Tortuous left V2 segment. No left vertebral artery stenosis to the skull base. CTA HEAD Posterior circulation: Patent vertebrobasilar junction with no distal vertebral artery stenosis. The right AICA appears dominant with a normal origin. Normal left PICA origin. Patent  basilar artery without stenosis. Mildly ectatic basilar tip, PCA and SCA origins without stenosis. Posterior communicating arteries are diminutive or absent. Tortuous P1 segments. Bilateral PCA branches are within normal limits. Anterior circulation: Both ICA siphons are patent. Mildly ectatic intracranial ICAs. No stenosis on the  left. Probable cavernous left ICA infundibulum on series 7, image 111, 1-2 millimeters. Normal left ophthalmic artery origin. On the right there is siphon ectasia with mild superimposed calcified plaque. No right siphon stenosis. Normal ophthalmic artery origin. Patent carotid termini, MCA and ACA origins. Mildly dominant left ACA A1. Anterior communicating artery and bilateral ACA branches are within normal limits. Mildly tortuous M1 segments. Left MCA M1 segment and bifurcation are patent without stenosis. Left MCA branches are within normal limits. Right MCA M1 segment and bifurcation are patent without stenosis. Right MCA branches are within normal limits. Venous sinuses: Early contrast timing. The superior sagittal sinus, dominant right transverse and sigmoid sinuses are patent. There is no enhancement of the left transverse, sigmoid, or IJ bulb but this is felt to be related to timing. Anatomic variants: None. Review of the MIP images confirms the above findings IMPRESSION: 1. Negative for large vessel occlusion. 2. 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 No significant stenosis. 3. 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 4. Lack of enhancement of the left transverse and sigmoid sinuses and left IJ bulb felt due to early contrast timing. 5. Mild calcified aortic atherosclerosis. Electronically Signed   By: Genevie Ann M.D.   On: 04/15/2019 13:18   Mr Brain Wo Contrast  Result Date: 04/15/2019 CLINICAL DATA:  Focal neuro deficit, greater than 6 hours, stroke suspected. Additional history provided: Acute onset speech difficulty. EXAM: MRI HEAD WITHOUT CONTRAST TECHNIQUE: Multiplanar, multiecho pulse sequences of the brain and surrounding structures were obtained without intravenous contrast. COMPARISON:  CT angiogram head/neck 04/15/2019, head CT 04/15/2019. FINDINGS: Brain: A  punctate focus of diffusion weighted hyperintensity is seen within the left parietal cortex on both the axial and coronal diffusion-weighted sequences (series 4, image 35) (series 5, image 7). This focus is too small to accurately characterize on the ADC map, but may reflect a punctate acute infarct. No evidence of intracranial mass. No midline shift or extra-axial fluid collection. No chronic intracranial blood products. Minimal scattered T2/FLAIR hyperintensity within the cerebral white matter is nonspecific, but consistent with chronic small vessel ischemic disease. Cerebral volume is normal for age. Vascular: Flow voids maintained within the proximal large arterial vessels. Skull and upper cervical spine: No focal marrow lesion. Sinuses/Orbits: Visualized orbits demonstrate no acute abnormality. Minimal ethmoid sinus mucosal thickening. No significant mastoid effusion. Other: Partially calcified lesion within the left parietal scalp, likely reflecting a sebaceous cyst. IMPRESSION: 1. Punctate acute infarct questioned within the left parietal cortex as described. 2. Otherwise, no evidence of acute intracranial abnormality. 3. Minimal chronic small vessel ischemic disease. Electronically Signed   By: Kellie Simmering DO   On: 04/15/2019 14:50   Ct Head Code Stroke Wo Contrast  Result Date: 04/15/2019 CLINICAL DATA:  Code stroke.  68 year old female.  Resolved aphasia. EXAM: CT HEAD WITHOUT CONTRAST TECHNIQUE: Contiguous axial images were obtained from the base of the skull through the vertex without intravenous contrast. COMPARISON:  None. FINDINGS: Brain: Cerebral volume is within normal limits for age. No midline shift, ventriculomegaly, mass effect, evidence of mass lesion, intracranial hemorrhage or evidence  of cortically based acute infarction. There is mild white matter hypodensity, including asymmetric left subinsular white matter involvement on series 3, image 18. No cytotoxic edema identified. Vascular:  Calcified atherosclerosis at the skull base. No suspicious intracranial vascular hyperdensity. Skull: Negative. Sinuses/Orbits: Visualized paranasal sinuses and mastoids are clear. Other: Partially calcified benign-appearing left vertex scalp probable sebaceous cyst. Otherwise Visualized orbits and scalp soft tissues are within normal limits. ASPECTS [facility] Stroke Program Early CT Score) Total score (0-10 with 10 being normal): 10. IMPRESSION: 1. 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 No acute cortically based infarct or intracranial hemorrhage identified. 2. ASPECTS 10. 3. Study discussed by telephone with Dr. Roland Rack on 04/15/2019 at 12:56 . Electronically Signed   By: Genevie Ann M.D.   On: 04/15/2019 12:57    PHYSICAL EXAM Pleasant frail elderly Caucasian lady not in distress. . Afebrile. Head is nontraumatic. Neck is supple without bruit.    Cardiac exam no murmur or gallop. Lungs are clear to auscultation. Distal pulses are well felt. Neurological Exam :  Neurological Exam ;  Awake  Alert oriented x 3. Normal speech and language.eye movements full without nystagmus.fundi were not visualized. Vision acuity and fields appear normal. Hearing is normal. Palatal movements are normal. Face symmetric. Tongue midline. Normal strength, tone, reflexes and coordination. Normal sensation. Gait deferred.  NIHSS 0.. Premorbid MRs 0  ASSESSMENT/PLAN Jacqueline Bruce is a 68 y.o. female with history of anxiety on clonazepam and osteopenia presenting with transient expressive aphasia.   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 Small vessel disease. ASPECTS 10.     CTA head & neck no LVO. Mild atherosclerosis. L siphon w/ 1-18mm outpouching in cavernous ? Infundibulum. Aortic calcified atherosclerosis.   MRI  puctate L parietal cortical infarct. Minimal small vessel disease.   2D Echo  EF 60-65%. No source of embolus   TEE to look for embolic source. I have arranged as an OP with [facility]. If positive for PFO (patent foramen ovale), check bilateral lower extremity venous dopplers to rule out DVT as possible source of stroke.   If TEE neg, place Loop recorder as an OP to look for atrial fibrillation as source of stroke. I contacted cardiology to arrange.   LDL 118  HgbA1c 5.5  Lovenox 40 mg sq daily for VTE prophylaxis  aspirin 81 mg daily prior to admission, now on No antithrombotic. Given mild stroke, recommend aspirin 81 mg and plavix 75 mg daily x 3 weeks, then PLAVIX alone. Orders adjusted. Patient interested in participating in the [employer] trial. [facility] will follow up for possible enrollment. Please contact them at [phone] for any questions.    Therapy recommendations:  No PT. No anticipated therapy needs anticipated.   Disposition:  Return home  Keep in hospital overnight to complete total 48h tele monitoring. If positive for AF by tomorrow, change to DOAC. If not, continue w/ planned TEE and loop as an OP.  May enroll in axiomatic. If so, continue study meds at d/c. Will need to be dispensed from main pharmacy.  Hyperlipidemia  Home meds:  Fish oil  LDL 118, goal < 70  Add statin  Continue statin at discharge  Other Stroke Risk Factors  Advanced age  Other Active Problems  Bradycardia  Osteopenia  Anxiety   Hospital day # 0  She presented with transient expressive aphasia due  to embolic left parietal MCA branch infarct.  Continue ongoing stroke work-up and dual antiplatelet therapy of aspirin and Plavix for 3 weeks followed by aspirin.  She will likely need TEE and prolonged cardiac monitoring but given the ongoing holiday this may not be possible till next week and will have to be arranged as an outpatient.  She may also consider possible participation in the BMS exam attic stroke prevention trial if  interested and will be given information to review and decide. Greater than 50% time during this 35-minute visit was spent on counseling and coordination of care about her embolic stroke and discussion about plan of care and answering questions.  Discussed with Dr. Eulogio Bear. Antony Contras, MD Medical Director [facility] St[facility] Pager: [phone] 04/16/2019 12:51 PM To contact Stroke Continuity provider, please refer to [email or web address] After hours, contact General Neurology

## 2019-04-16 NOTE — Progress Notes (Signed)
Occupational Therapy Evaluation Patient Details Name: Jacqueline Bruce MRN: [id number] DOB: January 18, 1951 Today's Date: 04/16/2019    History of Present Illness Jacqueline Bruce is a 68 y.o. female with medical history significant of anxiety for which she takes clonazepam, and osteopenia who presents with transient loss of clear speech for 20-30 minutes. MRI shwoing possible L parietal cortex stroke.   Clinical Impression   PTA, pt was living at home with her husband, and is independent with  with ADL/IADL and functional mobility. Pt currently functioning at baseline level. Pt reading upon arrival, discussed study she might become participate in. Pt demonstrates cognitive and visual skills WNL. She demonstrates LUE slightly weaker and slower coordination than RUE, but is [facility]. Patient evaluated by Occupational Therapy with no further acute OT needs identified. All education has been completed and the patient has no further questions. See below for any follow-up Occupational Therapy or equipment needs. OT to sign off. Thank you for referral.  .     Follow Up Recommendations  No OT follow up    Equipment Recommendations  None recommended by OT    Recommendations for Other Services       Precautions / Restrictions Precautions Precautions: None Restrictions Weight Bearing Restrictions: No      Mobility Bed Mobility Overal bed mobility: Independent                Transfers Overall transfer level: Independent Equipment used: None                  Balance Overall balance assessment: Independent                                         ADL either performed or assessed with clinical judgement   ADL Overall ADL's : At baseline                                             Vision Baseline Vision/History: Wears glasses Wears Glasses: At all times Patient Visual Report: No change from baseline Vision Assessment?: Yes Eye Alignment:  Within Functional Limits Ocular Range of Motion: Within Functional Limits Alignment/Gaze Preference: Within Defined Limits Tracking/Visual Pursuits: Able to track stimulus in all quads without difficulty Saccades: Within functional limits Convergence: Within functional limits Visual Fields: No apparent deficits Additional Comments: pt reading upon arrival,      Perception     Praxis      Pertinent Vitals/Pain Pain Assessment: No/denies pain     Hand Dominance Right   Extremity/Trunk Assessment Upper Extremity Assessment Upper Extremity Assessment: Overall WFL for tasks assessed;LUE deficits/detail LUE Deficits / Details: LUE grossly 4/5, RUE 5/5;slower rapid alternating movements with LUE;overall WFL LUE Sensation: WNL LUE Coordination: WNL   Lower Extremity Assessment Lower Extremity Assessment: Overall WFL for tasks assessed   Cervical / Trunk Assessment Cervical / Trunk Assessment: Normal   Communication Communication Communication: No difficulties   Cognition Arousal/Alertness: Awake/alert Behavior During Therapy: WFL for tasks assessed/performed Overall Cognitive Status: Within Functional Limits for tasks assessed                                     General Comments  Exercises     Shoulder Instructions      Home Living Family/patient expects to be discharged to:: Private residence Living Arrangements: Spouse/significant other(husband) Available Help at Discharge: Family Type of Home: House Home Access: Stairs to enter [employer] of Steps: 2   Home Layout: Two level;Able to live on main level with bedroom/bathroom               Home Equipment: Crutches      Lives With: Spouse    Prior Functioning/Environment Level of Independence: Independent        Comments: Retired; used to work for [employer]        OT Problem List: Impaired balance (sitting and/or standing);Decreased safety  awareness;Decreased knowledge of precautions      OT Treatment/Interventions:      OT Goals(Current goals can be found in the care plan section) Acute Rehab OT Goals Patient Stated Goal: "go home today." OT Goal Formulation: With patient Time For Goal Achievement: 04/30/19 Potential to Achieve Goals: Good  OT Frequency:     Barriers to D/C:            Co-evaluation              AM-PAC OT "6 Clicks" Daily Activity     Outcome Measure Help from another person eating meals?: None Help from another person taking care of personal grooming?: None Help from another person toileting, which includes using toliet, bedpan, or urinal?: None Help from another person bathing (including washing, rinsing, drying)?: None Help from another person to put on and taking off regular upper body clothing?: None Help from another person to put on and taking off regular lower body clothing?: None 6 Click Score: 24   End of Session Nurse Communication: Mobility status  Activity Tolerance: Patient tolerated treatment well Patient left: in bed;with call bell/phone within reach;with bed alarm set;Other (comment)(with MD present)  OT Visit Diagnosis: Other abnormalities of gait and mobility (R26.89)                Time: [id number] OT Time Calculation (min): 9 min Charges:  OT General Charges $OT Visit: 1 Visit OT Evaluation $OT Eval Low Complexity: [place] OTR/L Acute Rehabilitation Services Office: [place] 04/16/2019, 1:00 PM

## 2019-04-16 NOTE — Evaluation (Signed)
Physical Therapy Evaluation Patient Details Name: Jacqueline Bruce MRN: [id number] DOB: [birth year] Today's Date: 04/16/2019   History of Present Illness  Jacqueline Bruce is a 68 y.o. female with medical history significant of anxiety for which she takes clonazepam, and osteopenia who presents with transient loss of clear speech for 20-30 minutes. MRI shwoing possible L parietal cortex stroke.  Clinical Impression  Patient evaluated by Physical Therapy with no further acute PT needs identified. Prior to admission, pt lives with her husband and is active; enjoys going on 1-2 mile walks with her dog. On PT evaluation, pt ambulating hallways and negotiating steps independently. Scoring 23/24 on Dynamic Gait Index, indicating she is not at high risk for falls. HR 49 at rest, 102 bpm peak during mobility. All education has been completed and the patient has no further questions. No follow-up Physical Therapy or equipment needs. PT is signing off. Thank you for this referral.     Follow Up Recommendations No PT follow up    Equipment Recommendations  None recommended by PT    Recommendations for Other Services       Precautions / Restrictions Precautions Precautions: None Restrictions Weight Bearing Restrictions: No      Mobility  Bed Mobility Overal bed mobility: Independent                Transfers Overall transfer level: Independent Equipment used: None                Ambulation/Gait Ambulation/Gait assistance: Independent Gait Distance (Feet): 400 Feet Assistive device: None Gait Pattern/deviations: WFL(Within Functional Limits)     General Gait Details: No apparent gait/balance deficits  Stairs            Wheelchair Mobility    Modified Rankin (Stroke Patients Only) Modified Rankin (Stroke Patients Only) Pre-Morbid Rankin Score: No symptoms Modified Rankin: No symptoms     Balance Overall balance assessment: Independent                                Standardized Balance Assessment Standardized Balance Assessment : Dynamic Gait Index   Dynamic Gait Index Level Surface: Normal Change in Gait Speed: Normal Gait with Horizontal Head Turns: Normal Gait with Vertical Head Turns: Normal Gait and Pivot Turn: Mild Impairment Step Over Obstacle: Normal Step Around Obstacles: Normal Steps: Normal Total Score: 23       Pertinent Vitals/Pain Pain Assessment: No/denies pain    Home Living Family/patient expects to be discharged to:: Private residence Living Arrangements: Spouse/significant other(husband) Available Help at Discharge: Family Type of Home: House Home Access: Stairs to enter   Technical brewer of Steps: 2 Home Layout: Two level;Able to live on main level with bedroom/bathroom Home Equipment: Crutches      Prior Function Level of Independence: Independent         Comments: Retired; used to work for [employer]: Right    Extremity/Trunk Assessment   Upper Extremity Assessment Upper Extremity Assessment: Overall WFL for tasks assessed    Lower Extremity Assessment Lower Extremity Assessment: Overall WFL for tasks assessed    Cervical / Trunk Assessment Cervical / Trunk Assessment: Normal  Communication   Communication: No difficulties  Cognition Arousal/Alertness: Awake/alert Behavior During Therapy: WFL for tasks assessed/performed Overall Cognitive Status: Within Functional Limits for tasks assessed  General Comments      Exercises     Assessment/Plan    PT Assessment Patent does not need any further PT services  PT Problem List         PT Treatment Interventions      PT Goals (Current goals can be found in the Care Plan section)  Acute Rehab PT Goals Patient Stated Goal: "go home today." PT Goal Formulation: All assessment and education complete, DC  therapy    Frequency     Barriers to discharge        Co-evaluation               AM-PAC PT "6 Clicks" Mobility  Outcome Measure Help needed turning from your back to your side while in a flat bed without using bedrails?: None Help needed moving from lying on your back to sitting on the side of a flat bed without using bedrails?: None Help needed moving to and from a bed to a chair (including a wheelchair)?: None Help needed standing up from a chair using your arms (e.g., wheelchair or bedside chair)?: None Help needed to walk in hospital room?: None Help needed climbing 3-5 steps with a railing? : None 6 Click Score: 24    End of Session   Activity Tolerance: Patient tolerated treatment well Patient left: in bed;with call bell/phone within reach Nurse Communication: Mobility status PT Visit Diagnosis: Other symptoms and signs involving the nervous system (R29.898)    [id number] PT Time Calculation (min) (ACUTE ONLY): 21 min   Charges:   PT Evaluation $PT Eval Low Complexity: 1 Low          Ellamae Sia, PT, DPT Acute Rehabilitation Services Pager [phone] Office [phone]   Willy Eddy 04/16/2019, 8:42 AM

## 2019-04-16 NOTE — Progress Notes (Signed)
[facility] PROGRESS NOTE  Jacqueline Bruce [id number] DOB: May 31, 1950 DOA: 04/15/2019 PCP: Mayra Neer, MD  Assessment/Plan:  1. 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 Echo with EF 60%, no source embolus, LDL 118, HgA1c 5.5. evaluated by neuro who recommend plavix and asa for 3 weeks then asa alone, statin and 24 more hours tele monitoring as well as TEE. TEE arranged by neuro for OP and if +for PFO will get OP LE doppler. If TEE neg, place loop recorder as OP.   .  3.  Bradycardia with 41 to 50 bpm:  EKG with SB range 44-70. TSH 2.1. no rate altering meds. Echo as noted above. Monitor  4.  Osteopenia: Patient takes calcium and multiple other supplements to assist with her osteopenia.  She is also on vitamin D.  .  5.  Anxiety: This is a chronic problem she had been on Xanax but her doctor was concerned about her taking that regularly and has switched her to Klonopin she believes.    Code Status: full Family Communication: patient Disposition Plan: home likely tomorrow   Consultants:  sedi neuro  Procedures:    Antibiotics:    HPI/Subjective: Sitting on side of bed. Symptoms resolved. Requesting discharge this afternoon if possible  Objective: Vitals:   04/16/19 0431 04/16/19 0853  BP:    Pulse:  (!) 56  Resp:    Temp: 98.9 F (37.2 C) 98.9 F (37.2 C)  SpO2:  100%    Intake/Output Summary (Last 24 hours) at 04/16/2019 1035 Last data filed at 04/16/2019 0400 Gross per 24 hour  Intake 680.06 ml  Output -  Net 680.06 ml   Filed Weights   04/15/19 1305  Weight: 70.3 kg    Exam:   General:  Awake alert no acute distress  Cardiovascular: RRR no mgr no LE edema  Respiratory: normal effort BS clear bilaterally no wheeze  Abdomen: soft non-distended +BS no guarding or rebounding  Musculoskeletal: joints without swelling/erythema  Neuro: alert  oriented x3. Speech clear facial symmetry bilateral grip 5/5 LE edema strength 5/5 bilaterally    Data Reviewed: Basic Metabolic Panel: Recent Labs  Lab 04/15/19 1240 04/15/19 1252  NA 140 136  K 4.1 4.5  CL 107 109  CO2 25  --   GLUCOSE 98 96  BUN 22 29*  CREATININE 1.12* 1.00  CALCIUM 10.5*  --    Liver Function Tests: Recent Labs  Lab 04/15/19 1240  AST 25  ALT 22  ALKPHOS 58  BILITOT 0.6  PROT 7.3  ALBUMIN 4.2   No results for input(s): LIPASE, AMYLASE in the last 168 hours. No results for input(s): AMMONIA in the last 168 hours. CBC: Recent Labs  Lab 04/15/19 1240 04/15/19 1252  WBC 6.7  --   NEUTROABS 4.0  --   HGB 14.0 14.6  HCT 42.9 43.0  MCV 92.9  --   PLT 258  --    Cardiac Enzymes: No results for input(s): CKTOTAL, CKMB, CKMBINDEX, TROPONINI in the last 168 hours. BNP (last 3 results) No results for input(s): BNP in the last 8760 hours.  ProBNP (last 3 results) No results for input(s): PROBNP in the last 8760 hours.  CBG: Recent Labs  Lab 04/15/19 1240  GLUCAP 79    Recent Results (from the past 240 hour(s))  SARS CORONAVIRUS 2 (TAT 6-24 HRS) Nasopharyngeal Nasopharyngeal Swab     Status: None   Collection  Time: 04/15/19  1:37 PM   Specimen: Nasopharyngeal Swab  Result Value Ref Range Status   SARS Coronavirus 2 NEGATIVE NEGATIVE Final    Comment: (NOTE) SARS-CoV-2 target nucleic acids are NOT DETECTED. The SARS-CoV-2 RNA is generally detectable in upper and lower respiratory specimens during the acute phase of infection. Negative results do not preclude SARS-CoV-2 infection, do not rule out co-infections with other pathogens, and should not be used as the sole basis for treatment or other patient management decisions. Negative results must be combined with clinical observations, patient history, and epidemiological information. The expected result is Negative. Fact Sheet for Patients: SugarRoll[employer]be Fact  Sheet for Healthcare Providers: https://www.woods-mathews.com/ This test is not yet approved or cleared by the [place] FDA and  has been authorized for detection and/or diagnosis of SARS-CoV-2 by FDA under an Emergency Use Authorization (EUA). This EUA will remain  in effect (meaning this test can be used) for the duration of the COVID-19 declaration under Section 56 4(b)(1) of the Act, 21 U.S.C. section 360bbb-3(b)(1), unless the authorization is terminated or revoked sooner. Performed at [facility], [place] [address], [place], [place] [address]      Studies: Ct Angio Head W Or Wo Contrast  Result Date: 04/15/2019 CLINICAL DATA:  68 year old female code stroke presentation, aphasia now resolved. EXAM: CT ANGIOGRAPHY HEAD AND NECK TECHNIQUE: Multidetector CT imaging of the head and neck was performed using the standard protocol during bolus administration of intravenous contrast. Multiplanar CT image reconstructions and MIPs were obtained to evaluate the vascular anatomy. Carotid stenosis measurements (when applicable) are obtained utilizing NASCET criteria, using the distal internal carotid diameter as the denominator. CONTRAST:  78mL OMNIPAQUE IOHEXOL 350 MG/ML SOLN COMPARISON:  Plain head CT 1245 hours today. FINDINGS: CTA NECK Skeleton: Cervical spine degeneration. No acute osseous abnormality identified. Upper chest: Negative. Other neck: The glottis is closed. Upper limits of normal level 2 cervical lymph nodes, but otherwise negative. Aortic arch: Calcified aortic atherosclerosis. 3 vessel arch configuration. Right carotid system: No brachiocephalic artery or right CCA origin plaque or stenosis. Negative right carotid bifurcation. Negative cervical right ICA aside from tortuosity. Left carotid system: Negative left CCA origin. Negative left carotid bifurcation. Negative cervical left ICA. Vertebral arteries: Minor plaque in the proximal right subclavian artery without  stenosis. Normal right vertebral artery origin. Patent right vertebral artery to the skull base without stenosis. Mild plaque in the proximal left subclavian artery without stenosis. Left vertebral artery origin mildly affected without stenosis (series 8, image 147). Fairly codominant vertebral arteries. Tortuous left V2 segment. No left vertebral artery stenosis to the skull base. CTA HEAD Posterior circulation: Patent vertebrobasilar junction with no distal vertebral artery stenosis. The right AICA appears dominant with a normal origin. Normal left PICA origin. Patent basilar artery without stenosis. Mildly ectatic basilar tip, PCA and SCA origins without stenosis. Posterior communicating arteries are diminutive or absent. Tortuous P1 segments. Bilateral PCA branches are within normal limits. Anterior circulation: Both ICA siphons are patent. Mildly ectatic intracranial ICAs. No stenosis on the left. Probable cavernous left ICA infundibulum on series 7, image 111, 1-2 millimeters. Normal left ophthalmic artery origin. On the right there is siphon ectasia with mild superimposed calcified plaque. No right siphon stenosis. Normal ophthalmic artery origin. Patent carotid termini, MCA and ACA origins. Mildly dominant left ACA A1. Anterior communicating artery and bilateral ACA branches are within normal limits. Mildly tortuous M1 segments. Left MCA M1 segment and bifurcation are patent without stenosis. Left MCA  branches are within normal limits. Right MCA M1 segment and bifurcation are patent without stenosis. Right MCA branches are within normal limits. Venous sinuses: Early contrast timing. The superior sagittal sinus, dominant right transverse and sigmoid sinuses are patent. There is no enhancement of the left transverse, sigmoid, or IJ bulb but this is felt to be related to timing. Anatomic variants: None. Review of the MIP images confirms the above findings IMPRESSION: 1. Negative for large vessel occlusion. 2.  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 No significant stenosis. 3. 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 4. Lack of enhancement of the left transverse and sigmoid sinuses and left IJ bulb felt due to early contrast timing. 5. Mild calcified aortic atherosclerosis. Electronically Signed   By: Genevie Ann M.D.   On: 04/15/2019 13:18   Ct Angio Neck W Or Wo Contrast  Result Date: 04/15/2019 CLINICAL DATA:  68 year old female code stroke presentation, aphasia now resolved. EXAM: CT ANGIOGRAPHY HEAD AND NECK TECHNIQUE: Multidetector CT imaging of the head and neck was performed using the standard protocol during bolus administration of intravenous contrast. Multiplanar CT image reconstructions and MIPs were obtained to evaluate the vascular anatomy. Carotid stenosis measurements (when applicable) are obtained utilizing NASCET criteria, using the distal internal carotid diameter as the denominator. CONTRAST:  85mL OMNIPAQUE IOHEXOL 350 MG/ML SOLN COMPARISON:  Plain head CT 1245 hours today. FINDINGS: CTA NECK Skeleton: Cervical spine degeneration. No acute osseous abnormality identified. Upper chest: Negative. Other neck: The glottis is closed. Upper limits of normal level 2 cervical lymph nodes, but otherwise negative. Aortic arch: Calcified aortic atherosclerosis. 3 vessel arch configuration. Right carotid system: No brachiocephalic artery or right CCA origin plaque or stenosis. Negative right carotid bifurcation. Negative cervical right ICA aside from tortuosity. Left carotid system: Negative left CCA origin. Negative left carotid bifurcation. Negative cervical left ICA. Vertebral arteries: Minor plaque in the proximal right subclavian artery without stenosis. Normal right vertebral artery origin. Patent right vertebral artery to the skull base without stenosis. Mild plaque in the  proximal left subclavian artery without stenosis. Left vertebral artery origin mildly affected without stenosis (series 8, image 147). Fairly codominant vertebral arteries. Tortuous left V2 segment. No left vertebral artery stenosis to the skull base. CTA HEAD Posterior circulation: Patent vertebrobasilar junction with no distal vertebral artery stenosis. The right AICA appears dominant with a normal origin. Normal left PICA origin. Patent basilar artery without stenosis. Mildly ectatic basilar tip, PCA and SCA origins without stenosis. Posterior communicating arteries are diminutive or absent. Tortuous P1 segments. Bilateral PCA branches are within normal limits. Anterior circulation: Both ICA siphons are patent. Mildly ectatic intracranial ICAs. No stenosis on the left. Probable cavernous left ICA infundibulum on series 7, image 111, 1-2 millimeters. Normal left ophthalmic artery origin. On the right there is siphon ectasia with mild superimposed calcified plaque. No right siphon stenosis. Normal ophthalmic artery origin. Patent carotid termini, MCA and ACA origins. Mildly dominant left ACA A1. Anterior communicating artery and bilateral ACA branches are within normal limits. Mildly tortuous M1 segments. Left MCA M1 segment and bifurcation are patent without stenosis. Left MCA branches are within normal limits. Right MCA M1 segment and bifurcation are patent without stenosis. Right MCA branches are within normal limits. Venous sinuses: Early contrast timing. The superior sagittal sinus, dominant right transverse and sigmoid sinuses are patent. There is no enhancement  of the left transverse, sigmoid, or IJ bulb but this is felt to be related to timing. Anatomic variants: None. Review of the MIP images confirms the above findings IMPRESSION: 1. Negative for large vessel occlusion. 2. 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 No significant stenosis. 3. Ectatic left ICA siphon with 1-2  mm medial out-pouching in the cavernous segment felt to be a small infundibulum (series 7, image 111). 4. Lack of enhancement of the left transverse and sigmoid sinuses and left IJ bulb felt due to early contrast timing. 5. Mild calcified aortic atherosclerosis. Electronically Signed   By: Genevie Ann M.D.   On: 04/15/2019 13:18   Mr Brain Wo Contrast  Result Date: 04/15/2019 CLINICAL DATA:  Focal neuro deficit, greater than 6 hours, stroke suspected. Additional history provided: Acute onset speech difficulty. EXAM: MRI HEAD WITHOUT CONTRAST TECHNIQUE: Multiplanar, multiecho pulse sequences of the brain and surrounding structures were obtained without intravenous contrast. COMPARISON:  CT angiogram head/neck 04/15/2019, head CT 04/15/2019. FINDINGS: Brain: A punctate focus of diffusion weighted hyperintensity is seen within the left parietal cortex on both the axial and coronal diffusion-weighted sequences (series 4, image 35) (series 5, image 7). This focus is too small to accurately characterize on the ADC map, but may reflect a punctate acute infarct. No evidence of intracranial mass. No midline shift or extra-axial fluid collection. No chronic intracranial blood products. Minimal scattered T2/FLAIR hyperintensity within the cerebral white matter is nonspecific, but consistent with chronic small vessel ischemic disease. Cerebral volume is normal for age. Vascular: Flow voids maintained within the proximal large arterial vessels. Skull and upper cervical spine: No focal marrow lesion. Sinuses/Orbits: Visualized orbits demonstrate no acute abnormality. Minimal ethmoid sinus mucosal thickening. No significant mastoid effusion. Other: Partially calcified lesion within the left parietal scalp, likely reflecting a sebaceous cyst. IMPRESSION: 1. Punctate acute infarct questioned within the left parietal cortex as described. 2. Otherwise, no evidence of acute intracranial abnormality. 3. Minimal chronic small vessel  ischemic disease. Electronically Signed   By: Kellie Simmering DO   On: 04/15/2019 14:50   Ct Head Code Stroke Wo Contrast  Result Date: 04/15/2019 CLINICAL DATA:  Code stroke.  68 year old female.  Resolved aphasia. EXAM: CT HEAD WITHOUT CONTRAST TECHNIQUE: Contiguous axial images were obtained from the base of the skull through the vertex without intravenous contrast. COMPARISON:  None. FINDINGS: Brain: Cerebral volume is within normal limits for age. No midline shift, ventriculomegaly, mass effect, evidence of mass lesion, intracranial hemorrhage or evidence of cortically based acute infarction. There is mild white matter hypodensity, including asymmetric left subinsular white matter involvement on series 3, image 18. No cytotoxic edema identified. Vascular: Calcified atherosclerosis at the skull base. No suspicious intracranial vascular hyperdensity. Skull: Negative. Sinuses/Orbits: Visualized paranasal sinuses and mastoids are clear. Other: Partially calcified benign-appearing left vertex scalp probable sebaceous cyst. Otherwise Visualized orbits and scalp soft tissues are within normal limits. ASPECTS [facility] Stroke Program Early CT Score) Total score (0-10 with 10 being normal): 10. IMPRESSION: 1. 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 No acute cortically based infarct or intracranial hemorrhage identified. 2. ASPECTS 10. 3. Study discussed by telephone with Dr. Roland Rack on 04/15/2019 at 12:56 . Electronically Signed   By: Genevie Ann M.D.   On: 04/15/2019 12:57    Scheduled Meds: . aspirin  81 mg Oral Daily  . atorvastatin  40 mg Oral q1800  . clopidogrel  75 mg Oral Daily  . enoxaparin (LOVENOX) injection  40 mg Subcutaneous Q24H   Continuous Infusions:  Principal Problem:   TIA due to embolism (HCC) Active Problems:   Bradycardia with 41-50 beats per minute   Anxiety   Osteopenia    Time spent: 15 minutes    Buchanan NP [facility]  If 7PM-7AM, please contact  night-coverage at [email or web address], password [facility] 04/16/2019, 10:35 AM  LOS: 0 days

## 2019-04-17 MED ORDER — STUDY - INVESTIGATIONAL MEDICATION: 99 refills | Status: DC

## 2019-04-17 MED ORDER — MAGNESIUM SULFATE 2 GM/50ML IV SOLN: 2.0000 g | Freq: Once | INTRAVENOUS | Status: DC

## 2019-04-17 MED ORDER — ATORVASTATIN CALCIUM 40 MG PO TABS: 40.0000 mg | ORAL_TABLET | Freq: Every day | ORAL | 0 refills | Status: DC

## 2019-04-17 NOTE — Progress Notes (Signed)
STROKE TEAM PROGRESS NOTE   INTERVAL HISTORY Patient is doing well.  She has no complaints.  She was randomized to participate in the BMS  Axiomatic stroke trial..  She is ready to go home.  Repeat MRI scan of the brain as per the stroke trial protocol confirmed tiny punctate left parietal infarct which is unchanged.  Vitals:   04/16/19 1945 04/16/19 2307 04/17/19 0456 04/17/19 0740  BP: 103/67 (!) 103/58 106/64 101/69  Pulse: (!) 54 60 (!) 51 (!) 49  Resp: 18 18 18 16   Temp: 98.1 F (36.7 C) 98.3 F (36.8 C) 98.3 F (36.8 C) 98.3 F (36.8 C)  TempSrc: Oral Oral Oral Oral  SpO2: 99% 99% 100% 99%  Weight:      Height:        CBC:  Recent Labs  Lab 04/15/19 1240 04/15/19 1252  WBC 6.7  --   NEUTROABS 4.0  --   HGB 14.0 14.6  HCT 42.9 43.0  MCV 92.9  --   PLT 258  --     Basic Metabolic Panel:  Recent Labs  Lab 04/15/19 1240 04/15/19 1252 04/16/19 1304  NA 140 136  --   K 4.1 4.5  --   CL 107 109  --   CO2 25  --   --   GLUCOSE 98 96  --   BUN 22 29*  --   CREATININE 1.12* 1.00  --   CALCIUM 10.5*  --   --   MG  --   --  2.3  PHOS  --   --  1.7*   Lipid Panel:     Component Value Date/Time   CHOL 212 (H) 04/16/2019 0223   TRIG 62 04/16/2019 0223   HDL 82 04/16/2019 0223   CHOLHDL 2.6 04/16/2019 0223   VLDL 12 04/16/2019 0223   LDLCALC 118 (H) 04/16/2019 0223   HgbA1c:  Lab Results  Component Value Date   HGBA1C 5.5 04/16/2019   Urine Drug Screen: No results found for: LABOPIA, COCAINSCRNUR, LABBENZ, AMPHETMU, THCU, LABBARB  Alcohol Level No results found for: ETH  IMAGING Ct Angio Head W Or Wo Contrast  Result Date: 04/15/2019 CLINICAL DATA:  68 year old female code stroke presentation, aphasia now resolved. EXAM: CT ANGIOGRAPHY HEAD AND NECK TECHNIQUE: Multidetector CT imaging of the head and neck was performed using the standard protocol during bolus administration of intravenous contrast. Multiplanar CT image reconstructions and MIPs were  obtained to evaluate the vascular anatomy. Carotid stenosis measurements (when applicable) are obtained utilizing NASCET criteria, using the distal internal carotid diameter as the denominator. CONTRAST:  71mL OMNIPAQUE IOHEXOL 350 MG/ML SOLN COMPARISON:  Plain head CT 1245 hours today. FINDINGS: CTA NECK Skeleton: Cervical spine degeneration. No acute osseous abnormality identified. Upper chest: Negative. Other neck: The glottis is closed. Upper limits of normal level 2 cervical lymph nodes, but otherwise negative. Aortic arch: Calcified aortic atherosclerosis. 3 vessel arch configuration. Right carotid system: No brachiocephalic artery or right CCA origin plaque or stenosis. Negative right carotid bifurcation. Negative cervical right ICA aside from tortuosity. Left carotid system: Negative left CCA origin. Negative left carotid bifurcation. Negative cervical left ICA. Vertebral arteries: Minor plaque in the proximal right subclavian artery without stenosis. Normal right vertebral artery origin. Patent right vertebral artery to the skull base without stenosis. Mild plaque in the proximal left subclavian artery without stenosis. Left vertebral artery origin mildly affected without stenosis (series 8, image 147). Fairly codominant vertebral arteries. Tortuous left V2 segment.  No left vertebral artery stenosis to the skull base. CTA HEAD Posterior circulation: Patent vertebrobasilar junction with no distal vertebral artery stenosis. The right AICA appears dominant with a normal origin. Normal left PICA origin. Patent basilar artery without stenosis. Mildly ectatic basilar tip, PCA and SCA origins without stenosis. Posterior communicating arteries are diminutive or absent. Tortuous P1 segments. Bilateral PCA branches are within normal limits. Anterior circulation: Both ICA siphons are patent. Mildly ectatic intracranial ICAs. No stenosis on the left. Probable cavernous left ICA infundibulum on series 7, image 111, 1-2  millimeters. Normal left ophthalmic artery origin. On the right there is siphon ectasia with mild superimposed calcified plaque. No right siphon stenosis. Normal ophthalmic artery origin. Patent carotid termini, MCA and ACA origins. Mildly dominant left ACA A1. Anterior communicating artery and bilateral ACA branches are within normal limits. Mildly tortuous M1 segments. Left MCA M1 segment and bifurcation are patent without stenosis. Left MCA branches are within normal limits. Right MCA M1 segment and bifurcation are patent without stenosis. Right MCA branches are within normal limits. Venous sinuses: Early contrast timing. The superior sagittal sinus, dominant right transverse and sigmoid sinuses are patent. There is no enhancement of the left transverse, sigmoid, or IJ bulb but this is felt to be related to timing. Anatomic variants: None. Review of the MIP images confirms the above findings IMPRESSION: 1. Negative for large vessel occlusion. 2. 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 No significant stenosis. 3. 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 4. Lack of enhancement of the left transverse and sigmoid sinuses and left IJ bulb felt due to early contrast timing. 5. Mild calcified aortic atherosclerosis. Electronically Signed   By: Genevie Ann M.D.   On: 04/15/2019 13:18   Ct Angio Neck W Or Wo Contrast  Result Date: 04/15/2019 CLINICAL DATA:  68 year old female code stroke presentation, aphasia now resolved. EXAM: CT ANGIOGRAPHY HEAD AND NECK TECHNIQUE: Multidetector CT imaging of the head and neck was performed using the standard protocol during bolus administration of intravenous contrast. Multiplanar CT image reconstructions and MIPs were obtained to evaluate the vascular anatomy. Carotid stenosis measurements (when applicable) are obtained utilizing NASCET criteria, using the  distal internal carotid diameter as the denominator. CONTRAST:  65mL OMNIPAQUE IOHEXOL 350 MG/ML SOLN COMPARISON:  Plain head CT 1245 hours today. FINDINGS: CTA NECK Skeleton: Cervical spine degeneration. No acute osseous abnormality identified. Upper chest: Negative. Other neck: The glottis is closed. Upper limits of normal level 2 cervical lymph nodes, but otherwise negative. Aortic arch: Calcified aortic atherosclerosis. 3 vessel arch configuration. Right carotid system: No brachiocephalic artery or right CCA origin plaque or stenosis. Negative right carotid bifurcation. Negative cervical right ICA aside from tortuosity. Left carotid system: Negative left CCA origin. Negative left carotid bifurcation. Negative cervical left ICA. Vertebral arteries: Minor plaque in the proximal right subclavian artery without stenosis. Normal right vertebral artery origin. Patent right vertebral artery to the skull base without stenosis. Mild plaque in the proximal left subclavian artery without stenosis. Left vertebral artery origin mildly affected without stenosis (series 8, image 147). Fairly codominant vertebral arteries. Tortuous left V2 segment. No left vertebral artery stenosis to the skull base. CTA HEAD Posterior circulation: Patent vertebrobasilar junction with no distal vertebral artery stenosis. The right AICA appears dominant with a normal origin. Normal left PICA origin. Patent basilar artery without stenosis. Mildly ectatic basilar  tip, PCA and SCA origins without stenosis. Posterior communicating arteries are diminutive or absent. Tortuous P1 segments. Bilateral PCA branches are within normal limits. Anterior circulation: Both ICA siphons are patent. Mildly ectatic intracranial ICAs. No stenosis on the left. Probable cavernous left ICA infundibulum on series 7, image 111, 1-2 millimeters. Normal left ophthalmic artery origin. On the right there is siphon ectasia with mild superimposed calcified plaque. No right  siphon stenosis. Normal ophthalmic artery origin. Patent carotid termini, MCA and ACA origins. Mildly dominant left ACA A1. Anterior communicating artery and bilateral ACA branches are within normal limits. Mildly tortuous M1 segments. Left MCA M1 segment and bifurcation are patent without stenosis. Left MCA branches are within normal limits. Right MCA M1 segment and bifurcation are patent without stenosis. Right MCA branches are within normal limits. Venous sinuses: Early contrast timing. The superior sagittal sinus, dominant right transverse and sigmoid sinuses are patent. There is no enhancement of the left transverse, sigmoid, or IJ bulb but this is felt to be related to timing. Anatomic variants: None. Review of the MIP images confirms the above findings IMPRESSION: 1. Negative for large vessel occlusion. 2. 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 No significant stenosis. 3. 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 4. Lack of enhancement of the left transverse and sigmoid sinuses and left IJ bulb felt due to early contrast timing. 5. Mild calcified aortic atherosclerosis. Electronically Signed   By: Genevie Ann M.D.   On: 04/15/2019 13:18   Mr Brain Wo Contrast  Result Date: 04/16/2019 CLINICAL DATA:  Transient expressive aphasia now resolved, punctate infarct on prior study EXAM: MRI HEAD WITHOUT CONTRAST TECHNIQUE: Multiplanar, multiecho pulse sequences of the brain and surrounding structures were obtained without intravenous contrast. COMPARISON:  April 15, 2019 FINDINGS: Brain: Punctate left parietal cortical focus of diffusion hyperintensity is again identified. No new abnormal diffusion signal. Patchy foci of T2 hyperintensity are again identified in the supratentorial and pontine white matter likely reflecting stable minor chronic microvascular ischemic changes. Ventricles  are stable in size. No extra-axial collection. No evidence of hemorrhage. Vascular: Flow voids are preserved at the skull base. Skull and upper cervical spine: Normal marrow signal. Sinuses/Orbits: No acute finding. Other: Left posterior scalp probable sebaceous cyst. IMPRESSION: Persistent punctate left parietal acute or subacute cortical infarct. No new findings. Electronically Signed   By: Macy Mis M.D.   On: 04/16/2019 15:21   Mr Brain Wo Contrast  Result Date: 04/15/2019 CLINICAL DATA:  Focal neuro deficit, greater than 6 hours, stroke suspected. Additional history provided: Acute onset speech difficulty. EXAM: MRI HEAD WITHOUT CONTRAST TECHNIQUE: Multiplanar, multiecho pulse sequences of the brain and surrounding structures were obtained without intravenous contrast. COMPARISON:  CT angiogram head/neck 04/15/2019, head CT 04/15/2019. FINDINGS: Brain: A punctate focus of diffusion weighted hyperintensity is seen within the left parietal cortex on both the axial and coronal diffusion-weighted sequences (series 4, image 35) (series 5, image 7). This focus is too small to accurately characterize on the ADC map, but may reflect a punctate acute infarct. No evidence of intracranial mass. No midline shift or extra-axial fluid collection. No chronic intracranial blood products. Minimal scattered T2/FLAIR hyperintensity within the cerebral white matter is nonspecific, but consistent with chronic small vessel ischemic disease. Cerebral volume is normal for age. Vascular: Flow voids maintained within the proximal large arterial vessels. Skull and upper cervical  spine: No focal marrow lesion. Sinuses/Orbits: Visualized orbits demonstrate no acute abnormality. Minimal ethmoid sinus mucosal thickening. No significant mastoid effusion. Other: Partially calcified lesion within the left parietal scalp, likely reflecting a sebaceous cyst. IMPRESSION: 1. Punctate acute infarct questioned within the left parietal  cortex as described. 2. Otherwise, no evidence of acute intracranial abnormality. 3. Minimal chronic small vessel ischemic disease. Electronically Signed   By: Kellie Simmering DO   On: 04/15/2019 14:50   Ct Head Code Stroke Wo Contrast  Result Date: 04/15/2019 CLINICAL DATA:  Code stroke.  68 year old female.  Resolved aphasia. EXAM: CT HEAD WITHOUT CONTRAST TECHNIQUE: Contiguous axial images were obtained from the base of the skull through the vertex without intravenous contrast. COMPARISON:  None. FINDINGS: Brain: Cerebral volume is within normal limits for age. No midline shift, ventriculomegaly, mass effect, evidence of mass lesion, intracranial hemorrhage or evidence of cortically based acute infarction. There is mild white matter hypodensity, including asymmetric left subinsular white matter involvement on series 3, image 18. No cytotoxic edema identified. Vascular: Calcified atherosclerosis at the skull base. No suspicious intracranial vascular hyperdensity. Skull: Negative. Sinuses/Orbits: Visualized paranasal sinuses and mastoids are clear. Other: Partially calcified benign-appearing left vertex scalp probable sebaceous cyst. Otherwise Visualized orbits and scalp soft tissues are within normal limits. ASPECTS [facility] Stroke Program Early CT Score) Total score (0-10 with 10 being normal): 10. IMPRESSION: 1. 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 No acute cortically based infarct or intracranial hemorrhage identified. 2. ASPECTS 10. 3. Study discussed by telephone with Dr. Roland Rack on 04/15/2019 at 12:56 . Electronically Signed   By: Genevie Ann M.D.   On: 04/15/2019 12:57    PHYSICAL EXAM Pleasant frail elderly Caucasian lady not in distress. . Afebrile. Head is nontraumatic. Neck is supple without bruit.    Cardiac exam no murmur or gallop. Lungs are clear to auscultation. Distal pulses are well felt. Neurological Exam :  Neurological Exam ;  Awake  Alert oriented x 3. Normal speech and  language.eye movements full without nystagmus.fundi were not visualized. Vision acuity and fields appear normal. Hearing is normal. Palatal movements are normal. Face symmetric. Tongue midline. Normal strength, tone, reflexes and coordination. Normal sensation. Gait deferred.    ASSESSMENT/PLAN Ms. CAYCIE ESPAILLAT is a 68 y.o. female with history of anxiety on clonazepam and osteopenia presenting with transient expressive aphasia.   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 Small vessel disease. ASPECTS 10.     CTA head & neck no LVO. Mild atherosclerosis. L siphon w/ 1-49mm outpouching in cavernous ? Infundibulum. Aortic calcified atherosclerosis.   MRI  puctate L parietal cortical infarct. Minimal small vessel disease.   2D Echo EF 60-65%. No source of embolus   TEE to look for embolic source. I have arranged as an OP with [facility]. If positive for PFO (patent foramen ovale), check bilateral lower extremity venous dopplers to rule out DVT as possible source of stroke.   If TEE neg, place Loop recorder as an OP to look for atrial fibrillation as source of stroke. I contacted cardiology to arrange.   LDL 118  HgbA1c 5.5  Lovenox 40 mg sq daily for VTE prophylaxis  aspirin 81 mg daily prior to admission, now on No antithrombotic. Given mild stroke, recommend aspirin 81 mg and plavix 75 mg daily x 3 weeks, then PLAVIX alone. Orders adjusted. Patient interested in participating  in the Axiomatic trial. [facility] will follow up for possible enrollment. Please contact them at [phone] for any questions.    Therapy recommendations:  No PT. No anticipated therapy needs anticipated.   Disposition:  Return home  Keep in hospital overnight to complete total 48h tele monitoring. If positive for AF by tomorrow, change to DOAC. If not, continue w/  planned TEE and loop as an OP.  enrolled in axiomatic. BMS stroke trial   continue study meds at d/c. Will need to be dispensed from main pharmacy.  Hyperlipidemia  Home meds:  Fish oil  LDL 118, goal < 70  Add statin  Continue statin at discharge  Other Stroke Risk Factors  Advanced age  Other Active Problems  Bradycardia  Osteopenia  Anxiety   Hospital day # 1  She presented with transient expressive aphasia due to embolic left parietal MCA branch infarct.   She is participating in the [place] stroke prevention trial.  Outpatient TEE and loop recorder arranged for December 4.  Patient to be discharged home today.  Follow-up as an outpatient in the stroke research clinic as per the study protocol.  Discussed with patient and Dr. Eliseo Squires and answered questions. Greater than 50% time during this 25-minute visit was spent on counseling and coordination of care about her embolic stroke and discussion about plan of care and answering questions.  Discussed with Dr. Eulogio Bear. Antony Contras, MD Medical Director [facility] Stroke Center Pager: [phone] 04/17/2019 10:47 AM To contact Stroke Continuity provider, please refer to [email or web address] After hours, contact General Neurology

## 2019-04-17 NOTE — H&P (View-Only) (Signed)
STROKE TEAM PROGRESS NOTE   INTERVAL HISTORY Patient is doing well.  She has no complaints.  She was randomized to participate in the [facility]MS  Axiomatic stroke trial..  She is ready to go home.  Repeat MRI scan of the brain as per the stroke trial protocol confirmed tiny punctate left parietal infarct which is unchanged.  Vitals:   04/16/19 1945 04/16/19 2307 04/17/19 0456 04/17/19 0740  BP: 103/67 (!) 103/58 106/64 101/69  Pulse: (!) 54 60 (!) 51 (!) 49  Resp: 18 18 18 16   Temp: 98.1 F (36.7 C) 98.3 F (36.8 C) 98.3 F (36.8 C) 98.3 F (36.8 C)  TempSrc: Oral Oral Oral Oral  SpO2: 99% 99% 100% 99%  Weight:      Height:        CBC:  Recent Labs  Lab 04/15/19 1240 04/15/19 1252  WBC 6.7  --   NEUTROABS 4.0  --   HGB 14.0 14.6  HCT 42.9 43.0  MCV 92.9  --   PLT 258  --     Basic Metabolic Panel:  Recent Labs  Lab 04/15/19 1240 04/15/19 1252 04/16/19 1304  NA 140 136  --   K 4.1 4.5  --   CL 107 109  --   CO2 25  --   --   GLUCOSE 98 96  --   BUN 22 29*  --   CREATININE 1.12* 1.00  --   CALCIUM 10.5*  --   --   MG  --   --  2.3  PHOS  --   --  1.7*   Lipid Panel:     Component Value Date/Time   CHOL 212 (H) 04/16/2019 0223   TRIG 62 04/16/2019 0223   HDL 82 04/16/2019 0223   CHOLHDL 2.6 04/16/2019 0223   VLDL 12 04/16/2019 0223   LDLCALC 118 (H) 04/16/2019 0223   HgbA1c:  Lab Results  Component Value Date   HGBA1C 5.5 04/16/2019   Urine Drug Screen: No results found for: LABOPIA, COCAINSCRNUR, LABBENZ, AMPHETMU, THCU, LABBARB  Alcohol Level No results found for: ETH  IMAGING Ct Angio Head W Or Wo Contrast  Result Date: 04/15/2019 CLINICAL DATA:  68 year old female code stroke presentation, aphasia now resolved. EXAM: CT ANGIOGRAPHY HEAD AND NECK TECHNIQUE: Multidetector CT imaging of the head and neck was performed using the standard protocol during bolus administration of intravenous contrast. Multiplanar CT image reconstructions and MIPs were  obtained to evaluate the vascular anatomy. Carotid stenosis measurements (when applicable) are obtained utilizing NASCET criteria, using the distal internal carotid diameter as the denominator. CONTRAST:  24mL OMNIPAQUE IOHEXOL 350 MG/ML SOLN COMPARISON:  Plain head CT 1245 hours today. FINDINGS: CTA NECK Skeleton: Cervical spine degeneration. No acute osseous abnormality identified. Upper chest: Negative. Other neck: The glottis is closed. Upper limits of normal level 2 cervical lymph nodes, but otherwise negative. Aortic arch: Calcified aortic atherosclerosis. 3 vessel arch configuration. Right carotid system: No brachiocephalic artery or right CCA origin plaque or stenosis. Negative right carotid bifurcation. Negative cervical right ICA aside from tortuosity. Left carotid system: Negative left CCA origin. Negative left carotid bifurcation. Negative cervical left ICA. Vertebral arteries: Minor plaque in the proximal right subclavian artery without stenosis. Normal right vertebral artery origin. Patent right vertebral artery to the skull base without stenosis. Mild plaque in the proximal left subclavian artery without stenosis. Left vertebral artery origin mildly affected without stenosis (series 8, image 147). Fairly codominant vertebral arteries. Tortuous left V2 segment.  No left vertebral artery stenosis to the skull base. CTA HEAD Posterior circulation: Patent vertebrobasilar junction with no distal vertebral artery stenosis. The right AICA appears dominant with a normal origin. Normal left PICA origin. Patent basilar artery without stenosis. Mildly ectatic basilar tip, PCA and SCA origins without stenosis. Posterior communicating arteries are diminutive or absent. Tortuous P1 segments. Bilateral PCA branches are within normal limits. Anterior circulation: Both ICA siphons are patent. Mildly ectatic intracranial ICAs. No stenosis on the left. Probable cavernous left ICA infundibulum on series 7, image 111, 1-2  millimeters. Normal left ophthalmic artery origin. On the right there is siphon ectasia with mild superimposed calcified plaque. No right siphon stenosis. Normal ophthalmic artery origin. Patent carotid termini, MCA and ACA origins. Mildly dominant left ACA A1. Anterior communicating artery and bilateral ACA branches are within normal limits. Mildly tortuous M1 segments. Left MCA M1 segment and bifurcation are patent without stenosis. Left MCA branches are within normal limits. Right MCA M1 segment and bifurcation are patent without stenosis. Right MCA branches are within normal limits. Venous sinuses: Early contrast timing. The superior sagittal sinus, dominant right transverse and sigmoid sinuses are patent. There is no enhancement of the left transverse, sigmoid, or IJ bulb but this is felt to be related to timing. Anatomic variants: None. Review of the MIP images confirms the above findings IMPRESSION: 1. Negative for large vessel occlusion. 2. 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 No significant stenosis. 3. 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 4. Lack of enhancement of the left transverse and sigmoid sinuses and left IJ bulb felt due to early contrast timing. 5. Mild calcified aortic atherosclerosis. Electronically Signed   By: Genevie Ann M.D.   On: 04/15/2019 13:18   Ct Angio Neck W Or Wo Contrast  Result Date: 04/15/2019 CLINICAL DATA:  68 year old female code stroke presentation, aphasia now resolved. EXAM: CT ANGIOGRAPHY HEAD AND NECK TECHNIQUE: Multidetector CT imaging of the head and neck was performed using the standard protocol during bolus administration of intravenous contrast. Multiplanar CT image reconstructions and MIPs were obtained to evaluate the vascular anatomy. Carotid stenosis measurements (when applicable) are obtained utilizing NASCET criteria, using the  distal internal carotid diameter as the denominator. CONTRAST:  64mL OMNIPAQUE IOHEXOL 350 MG/ML SOLN COMPARISON:  Plain head CT 1245 hours today. FINDINGS: CTA NECK Skeleton: Cervical spine degeneration. No acute osseous abnormality identified. Upper chest: Negative. Other neck: The glottis is closed. Upper limits of normal level 2 cervical lymph nodes, but otherwise negative. Aortic arch: Calcified aortic atherosclerosis. 3 vessel arch configuration. Right carotid system: No brachiocephalic artery or right CCA origin plaque or stenosis. Negative right carotid bifurcation. Negative cervical right ICA aside from tortuosity. Left carotid system: Negative left CCA origin. Negative left carotid bifurcation. Negative cervical left ICA. Vertebral arteries: Minor plaque in the proximal right subclavian artery without stenosis. Normal right vertebral artery origin. Patent right vertebral artery to the skull base without stenosis. Mild plaque in the proximal left subclavian artery without stenosis. Left vertebral artery origin mildly affected without stenosis (series 8, image 147). Fairly codominant vertebral arteries. Tortuous left V2 segment. No left vertebral artery stenosis to the skull base. CTA HEAD Posterior circulation: Patent vertebrobasilar junction with no distal vertebral artery stenosis. The right AICA appears dominant with a normal origin. Normal left PICA origin. Patent basilar artery without stenosis. Mildly ectatic basilar  tip, PCA and SCA origins without stenosis. Posterior communicating arteries are diminutive or absent. Tortuous P1 segments. Bilateral PCA branches are within normal limits. Anterior circulation: Both ICA siphons are patent. Mildly ectatic intracranial ICAs. No stenosis on the left. Probable cavernous left ICA infundibulum on series 7, image 111, 1-2 millimeters. Normal left ophthalmic artery origin. On the right there is siphon ectasia with mild superimposed calcified plaque. No right  siphon stenosis. Normal ophthalmic artery origin. Patent carotid termini, MCA and ACA origins. Mildly dominant left ACA A1. Anterior communicating artery and bilateral ACA branches are within normal limits. Mildly tortuous M1 segments. Left MCA M1 segment and bifurcation are patent without stenosis. Left MCA branches are within normal limits. Right MCA M1 segment and bifurcation are patent without stenosis. Right MCA branches are within normal limits. Venous sinuses: Early contrast timing. The superior sagittal sinus, dominant right transverse and sigmoid sinuses are patent. There is no enhancement of the left transverse, sigmoid, or IJ bulb but this is felt to be related to timing. Anatomic variants: None. Review of the MIP images confirms the above findings IMPRESSION: 1. Negative for large vessel occlusion. 2. 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 No significant stenosis. 3. 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 4. Lack of enhancement of the left transverse and sigmoid sinuses and left IJ bulb felt due to early contrast timing. 5. Mild calcified aortic atherosclerosis. Electronically Signed   By: Genevie Ann M.D.   On: 04/15/2019 13:18   Mr Brain Wo Contrast  Result Date: 04/16/2019 CLINICAL DATA:  Transient expressive aphasia now resolved, punctate infarct on prior study EXAM: MRI HEAD WITHOUT CONTRAST TECHNIQUE: Multiplanar, multiecho pulse sequences of the brain and surrounding structures were obtained without intravenous contrast. COMPARISON:  April 15, 2019 FINDINGS: Brain: Punctate left parietal cortical focus of diffusion hyperintensity is again identified. No new abnormal diffusion signal. Patchy foci of T2 hyperintensity are again identified in the supratentorial and pontine white matter likely reflecting stable minor chronic microvascular ischemic changes. Ventricles  are stable in size. No extra-axial collection. No evidence of hemorrhage. Vascular: Flow voids are preserved at the skull base. Skull and upper cervical spine: Normal marrow signal. Sinuses/Orbits: No acute finding. Other: Left posterior scalp probable sebaceous cyst. IMPRESSION: Persistent punctate left parietal acute or subacute cortical infarct. No new findings. Electronically Signed   By: Macy Mis M.D.   On: 04/16/2019 15:21   Mr Brain Wo Contrast  Result Date: 04/15/2019 CLINICAL DATA:  Focal neuro deficit, greater than 6 hours, stroke suspected. Additional history provided: Acute onset speech difficulty. EXAM: MRI HEAD WITHOUT CONTRAST TECHNIQUE: Multiplanar, multiecho pulse sequences of the brain and surrounding structures were obtained without intravenous contrast. COMPARISON:  CT angiogram head/neck 04/15/2019, head CT 04/15/2019. FINDINGS: Brain: A punctate focus of diffusion weighted hyperintensity is seen within the left parietal cortex on both the axial and coronal diffusion-weighted sequences (series 4, image 35) (series 5, image 7). This focus is too small to accurately characterize on the ADC map, but may reflect a punctate acute infarct. No evidence of intracranial mass. No midline shift or extra-axial fluid collection. No chronic intracranial blood products. Minimal scattered T2/FLAIR hyperintensity within the cerebral white matter is nonspecific, but consistent with chronic small vessel ischemic disease. Cerebral volume is normal for age. Vascular: Flow voids maintained within the proximal large arterial vessels. Skull and upper cervical  spine: No focal marrow lesion. Sinuses/Orbits: Visualized orbits demonstrate no acute abnormality. Minimal ethmoid sinus mucosal thickening. No significant mastoid effusion. Other: Partially calcified lesion within the left parietal scalp, likely reflecting a sebaceous cyst. IMPRESSION: 1. Punctate acute infarct questioned within the left parietal  cortex as described. 2. Otherwise, no evidence of acute intracranial abnormality. 3. Minimal chronic small vessel ischemic disease. Electronically Signed   By: Kellie Simmering DO   On: 04/15/2019 14:50   Ct Head Code Stroke Wo Contrast  Result Date: 04/15/2019 CLINICAL DATA:  Code stroke.  68 year old female.  Resolved aphasia. EXAM: CT HEAD WITHOUT CONTRAST TECHNIQUE: Contiguous axial images were obtained from the base of the skull through the vertex without intravenous contrast. COMPARISON:  None. FINDINGS: Brain: Cerebral volume is within normal limits for age. No midline shift, ventriculomegaly, mass effect, evidence of mass lesion, intracranial hemorrhage or evidence of cortically based acute infarction. There is mild white matter hypodensity, including asymmetric left subinsular white matter involvement on series 3, image 18. No cytotoxic edema identified. Vascular: Calcified atherosclerosis at the skull base. No suspicious intracranial vascular hyperdensity. Skull: Negative. Sinuses/Orbits: Visualized paranasal sinuses and mastoids are clear. Other: Partially calcified benign-appearing left vertex scalp probable sebaceous cyst. Otherwise Visualized orbits and scalp soft tissues are within normal limits. ASPECTS [facility] Stroke Program Early CT Score) Total score (0-10 with 10 being normal): 10. IMPRESSION: 1. 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 No acute cortically based infarct or intracranial hemorrhage identified. 2. ASPECTS 10. 3. Study discussed by telephone with Dr. Roland Rack on 04/15/2019 at 12:56 . Electronically Signed   By: Genevie Ann M.D.   On: 04/15/2019 12:57    PHYSICAL EXAM Pleasant frail elderly Caucasian lady not in distress. . Afebrile. Head is nontraumatic. Neck is supple without bruit.    Cardiac exam no murmur or gallop. Lungs are clear to auscultation. Distal pulses are well felt. Neurological Exam :  Neurological Exam ;  Awake  Alert oriented x 3. Normal speech and  language.eye movements full without nystagmus.fundi were not visualized. Vision acuity and fields appear normal. Hearing is normal. Palatal movements are normal. Face symmetric. Tongue midline. Normal strength, tone, reflexes and coordination. Normal sensation. Gait deferred.    ASSESSMENT/PLAN [name] is a 68 y.o. female with history of anxiety on clonazepam and osteopenia presenting with transient expressive aphasia.   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 Small vessel disease. ASPECTS 10.     CTA head & neck no LVO. Mild atherosclerosis. L siphon w/ 1-32mm outpouching in cavernous ? Infundibulum. Aortic calcified atherosclerosis.   MRI  puctate L parietal cortical infarct. Minimal small vessel disease.   2D Echo EF 60-65%. No source of embolus   TEE to look for embolic source. I have arranged as an OP with [facility]. If positive for PFO (patent foramen ovale), check bilateral lower extremity venous dopplers to rule out DVT as possible source of stroke.   If TEE neg, place Loop recorder as an OP to look for atrial fibrillation as source of stroke. I contacted cardiology to arrange.   LDL 118  HgbA1c 5.5  Lovenox 40 mg sq daily for VTE prophylaxis  aspirin 81 mg daily prior to admission, now on No antithrombotic. Given mild stroke, recommend aspirin 81 mg and plavix 75 mg daily x 3 weeks, then PLAVIX alone. Orders adjusted. Patient interested in participating  in the Axiomatic trial. [facility] will follow up for possible enrollment. Please contact them at [phone] for any questions.    Therapy recommendations:  No PT. No anticipated therapy needs anticipated.   Disposition:  Return home  Keep in hospital overnight to complete total 48h tele monitoring. If positive for AF by tomorrow, change to DOAC. If not, continue w/  planned TEE and loop as an OP.  enrolled in axiomatic. BMS stroke trial   continue study meds at d/c. Will need to be dispensed from main pharmacy.  Hyperlipidemia  Home meds:  Fish oil  LDL 118, goal < 70  Add statin  Continue statin at discharge  Other Stroke Risk Factors  Advanced age  Other Active Problems  Bradycardia  Osteopenia  Anxiety   Hospital day # 1  She presented with transient expressive aphasia due to embolic left parietal MCA branch infarct.   She is participating in the [place] stroke prevention trial.  Outpatient TEE and loop recorder arranged for December 4.  Patient to be discharged home today.  Follow-up as an outpatient in the stroke research clinic as per the study protocol.  Discussed with patient and Dr. Eliseo Squires and answered questions. Greater than 50% time during this 25-minute visit was spent on counseling and coordination of care about her embolic stroke and discussion about plan of care and answering questions.  Discussed with Dr. Eulogio Bear. Antony Contras, MD Medical Director [facility] Pager: [phone] 04/17/2019 10:47 AM To contact Stroke Continuity provider, please refer to [email or web address] After hours, contact General Neurology

## 2019-04-17 NOTE — Discharge Summary (Signed)
Physician Discharge Summary  Jacqueline Bruce [id number] DOB: [birth year] DOA: 04/15/2019  PCP: Mayra Neer, MD  Admit date: 04/15/2019 Discharge date: 04/17/2019  Admitted From: home Discharge disposition: home   Recommendations for Outpatient Follow-Up:   1. Outpatient TEE/loop 2. Enrolled in BMS stroke study 3. Statin started   Discharge Diagnosis:   Principal Problem:   TIA due to embolism Jacqueline Bruce) Active Problems:   Bradycardia with 41-50 beats per minute   Osteopenia   Anxiety    Discharge Condition: Improved.  Diet recommendation: Low sodium, heart healthy  Wound care: None.  Code status: Full.   History of Present Illness:   Jacqueline Bruce is a 68 y.o. female with medical history significant of anxiety for which she takes clonazepam, and osteopenia who presents to the emergency department via [facility].  She was at [place] and had acute onset of not being able to talk.  She knew what she wanted to stay but was unable to get her words out.  Morning where she walked the dog and ate breakfast then went out to [employer].  She had a 20 to 30-minute inability get words out.  On route her symptoms have improved and she has now resolved completely.  During prank transport the patient rapidly improved.  At the bridge she did have 1 or 2 episodes of word finding difficulty.  By the end of her CT scan her symptoms have completely resolved.  She has no other complaints and states she has never had this type of event before.  She takes aspirin once in a while not regularly.  He has no history of hypertension, hypercholesterolemia, diabetes, coronary artery disease, family history of early stroke (father died in his late 79s of a stroke), she is a non-smoker, she is not obese.  She denies any pain, headache, blurry vision or double vision.  She has no associated symptoms.   Bruce Course by Problem:   Strok- embolic: L parietal cortical infarct  embolic secondary to unknown source but concerning for atrial fibrillation per neuro note. Echo with EF 60%, no source embolus - LDL 118: statin started -HgA1c 5.5. -patient enrolled in BMS stroke study  - TEE arranged by neuro for OP and if +for PFO will get OP LE doppler. If TEE neg, place loop recorder as OP.  .   Bradycardia with 41 to 50 bpm:  EKG with SB range 44-70. TSH 2.1. no rate altering meds. Echo as noted above. Monitor  Osteopenia: Patient takes calcium and multiple other supplements to assist with her osteopenia. She is also on vitamin D. .      Medical Consultants:    Neurology  Discharge Exam:   Vitals:   04/17/19 0456 04/17/19 0740  BP: 106/64 101/69  Pulse: (!) 51 (!) 49  Resp: 18 16  Temp: 98.3 F (36.8 C) 98.3 F (36.8 C)  SpO2: 100% 99%   Vitals:   04/16/19 1945 04/16/19 2307 04/17/19 0456 04/17/19 0740  BP: 103/67 (!) 103/58 106/64 101/69  Pulse: (!) 54 60 (!) 51 (!) 49  Resp: 18 18 18 16   Temp: 98.1 F (36.7 C) 98.3 F (36.8 C) 98.3 F (36.8 C) 98.3 F (36.8 C)  TempSrc: Oral Oral Oral Oral  SpO2: 99% 99% 100% 99%  Weight:      Height:        General exam: Appears calm and comfortable.   The results of significant diagnostics from this hospitalization (  including imaging, microbiology, ancillary and laboratory) are listed below for reference.     Procedures and Diagnostic Studies:   Ct Angio Head W Or Wo Contrast  Result Date: 04/15/2019 CLINICAL DATA:  68 year old female code stroke presentation, aphasia now resolved. EXAM: CT ANGIOGRAPHY HEAD AND NECK TECHNIQUE: Multidetector CT imaging of the head and neck was performed using the standard protocol during bolus administration of intravenous contrast. Multiplanar CT image reconstructions and MIPs were obtained to evaluate the vascular anatomy. Carotid stenosis measurements (when applicable) are obtained utilizing NASCET criteria, using the distal internal carotid diameter as the  denominator. CONTRAST:  91mL OMNIPAQUE IOHEXOL 350 MG/ML SOLN COMPARISON:  Plain head CT 1245 hours today. FINDINGS: CTA NECK Skeleton: Cervical spine degeneration. No acute osseous abnormality identified. Upper chest: Negative. Other neck: The glottis is closed. Upper limits of normal level 2 cervical lymph nodes, but otherwise negative. Aortic arch: Calcified aortic atherosclerosis. 3 vessel arch configuration. Right carotid system: No brachiocephalic artery or right CCA origin plaque or stenosis. Negative right carotid bifurcation. Negative cervical right ICA aside from tortuosity. Left carotid system: Negative left CCA origin. Negative left carotid bifurcation. Negative cervical left ICA. Vertebral arteries: Minor plaque in the proximal right subclavian artery without stenosis. Normal right vertebral artery origin. Patent right vertebral artery to the skull base without stenosis. Mild plaque in the proximal left subclavian artery without stenosis. Left vertebral artery origin mildly affected without stenosis (series 8, image 147). Fairly codominant vertebral arteries. Tortuous left V2 segment. No left vertebral artery stenosis to the skull base. CTA HEAD Posterior circulation: Patent vertebrobasilar junction with no distal vertebral artery stenosis. The right AICA appears dominant with a normal origin. Normal left PICA origin. Patent basilar artery without stenosis. Mildly ectatic basilar tip, PCA and SCA origins without stenosis. Posterior communicating arteries are diminutive or absent. Tortuous P1 segments. Bilateral PCA branches are within normal limits. Anterior circulation: Both ICA siphons are patent. Mildly ectatic intracranial ICAs. No stenosis on the left. Probable cavernous left ICA infundibulum on series 7, image 111, 1-2 millimeters. Normal left ophthalmic artery origin. On the right there is siphon ectasia with mild superimposed calcified plaque. No right siphon stenosis. Normal ophthalmic artery  origin. Patent carotid termini, MCA and ACA origins. Mildly dominant left ACA A1. Anterior communicating artery and bilateral ACA branches are within normal limits. Mildly tortuous M1 segments. Left MCA M1 segment and bifurcation are patent without stenosis. Left MCA branches are within normal limits. Right MCA M1 segment and bifurcation are patent without stenosis. Right MCA branches are within normal limits. Venous sinuses: Early contrast timing. The superior sagittal sinus, dominant right transverse and sigmoid sinuses are patent. There is no enhancement of the left transverse, sigmoid, or IJ bulb but this is felt to be related to timing. Anatomic variants: None. Review of the MIP images confirms the above findings IMPRESSION: 1. Negative for large vessel occlusion. 2. 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 No significant stenosis. 3. 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 4. Lack of enhancement of the left transverse and sigmoid sinuses and left IJ bulb felt due to early contrast timing. 5. Mild calcified aortic atherosclerosis. Electronically Signed   By: Genevie Ann M.D.   On: 04/15/2019 13:18   Ct Angio Neck W Or Wo Contrast  Result Date: 04/15/2019 CLINICAL DATA:  68 year old female code stroke presentation, aphasia now resolved.  EXAM: CT ANGIOGRAPHY HEAD AND NECK TECHNIQUE: Multidetector CT imaging of the head and neck was performed using the standard protocol during bolus administration of intravenous contrast. Multiplanar CT image reconstructions and MIPs were obtained to evaluate the vascular anatomy. Carotid stenosis measurements (when applicable) are obtained utilizing NASCET criteria, using the distal internal carotid diameter as the denominator. CONTRAST:  46mL OMNIPAQUE IOHEXOL 350 MG/ML SOLN COMPARISON:  Plain head CT 1245 hours today. FINDINGS: CTA NECK Skeleton:  Cervical spine degeneration. No acute osseous abnormality identified. Upper chest: Negative. Other neck: The glottis is closed. Upper limits of normal level 2 cervical lymph nodes, but otherwise negative. Aortic arch: Calcified aortic atherosclerosis. 3 vessel arch configuration. Right carotid system: No brachiocephalic artery or right CCA origin plaque or stenosis. Negative right carotid bifurcation. Negative cervical right ICA aside from tortuosity. Left carotid system: Negative left CCA origin. Negative left carotid bifurcation. Negative cervical left ICA. Vertebral arteries: Minor plaque in the proximal right subclavian artery without stenosis. Normal right vertebral artery origin. Patent right vertebral artery to the skull base without stenosis. Mild plaque in the proximal left subclavian artery without stenosis. Left vertebral artery origin mildly affected without stenosis (series 8, image 147). Fairly codominant vertebral arteries. Tortuous left V2 segment. No left vertebral artery stenosis to the skull base. CTA HEAD Posterior circulation: Patent vertebrobasilar junction with no distal vertebral artery stenosis. The right AICA appears dominant with a normal origin. Normal left PICA origin. Patent basilar artery without stenosis. Mildly ectatic basilar tip, PCA and SCA origins without stenosis. Posterior communicating arteries are diminutive or absent. Tortuous P1 segments. Bilateral PCA branches are within normal limits. Anterior circulation: Both ICA siphons are patent. Mildly ectatic intracranial ICAs. No stenosis on the left. Probable cavernous left ICA infundibulum on series 7, image 111, 1-2 millimeters. Normal left ophthalmic artery origin. On the right there is siphon ectasia with mild superimposed calcified plaque. No right siphon stenosis. Normal ophthalmic artery origin. Patent carotid termini, MCA and ACA origins. Mildly dominant left ACA A1. Anterior communicating artery and bilateral ACA  branches are within normal limits. Mildly tortuous M1 segments. Left MCA M1 segment and bifurcation are patent without stenosis. Left MCA branches are within normal limits. Right MCA M1 segment and bifurcation are patent without stenosis. Right MCA branches are within normal limits. Venous sinuses: Early contrast timing. The superior sagittal sinus, dominant right transverse and sigmoid sinuses are patent. There is no enhancement of the left transverse, sigmoid, or IJ bulb but this is felt to be related to timing. Anatomic variants: None. Review of the MIP images confirms the above findings IMPRESSION: 1. Negative for large vessel occlusion. 2. 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 No significant stenosis. 3. 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 4. Lack of enhancement of the left transverse and sigmoid sinuses and left IJ bulb felt due to early contrast timing. 5. Mild calcified aortic atherosclerosis. Electronically Signed   By: Genevie Ann M.D.   On: 04/15/2019 13:18   Mr Brain Wo Contrast  Result Date: 04/16/2019 CLINICAL DATA:  Transient expressive aphasia now resolved, punctate infarct on prior study EXAM: MRI HEAD WITHOUT CONTRAST TECHNIQUE: Multiplanar, multiecho pulse sequences of the brain and surrounding structures were obtained without intravenous contrast. COMPARISON:  April 15, 2019 FINDINGS: Brain: Punctate left parietal cortical focus of diffusion hyperintensity is again identified. No new abnormal diffusion signal.  Patchy foci of T2 hyperintensity are again identified in the supratentorial and pontine white matter likely reflecting stable minor chronic microvascular ischemic changes. Ventricles are stable in size. No extra-axial collection. No evidence of hemorrhage. Vascular: Flow voids are preserved at the skull base. Skull and upper cervical spine: Normal marrow  signal. Sinuses/Orbits: No acute finding. Other: Left posterior scalp probable sebaceous cyst. IMPRESSION: Persistent punctate left parietal acute or subacute cortical infarct. No new findings. Electronically Signed   By: Macy Mis M.D.   On: 04/16/2019 15:21   Mr Brain Wo Contrast  Result Date: 04/15/2019 CLINICAL DATA:  Focal neuro deficit, greater than 6 hours, stroke suspected. Additional history provided: Acute onset speech difficulty. EXAM: MRI HEAD WITHOUT CONTRAST TECHNIQUE: Multiplanar, multiecho pulse sequences of the brain and surrounding structures were obtained without intravenous contrast. COMPARISON:  CT angiogram head/neck 04/15/2019, head CT 04/15/2019. FINDINGS: Brain: A punctate focus of diffusion weighted hyperintensity is seen within the left parietal cortex on both the axial and coronal diffusion-weighted sequences (series 4, image 35) (series 5, image 7). This focus is too small to accurately characterize on the ADC map, but may reflect a punctate acute infarct. No evidence of intracranial mass. No midline shift or extra-axial fluid collection. No chronic intracranial blood products. Minimal scattered T2/FLAIR hyperintensity within the cerebral white matter is nonspecific, but consistent with chronic small vessel ischemic disease. Cerebral volume is normal for age. Vascular: Flow voids maintained within the proximal large arterial vessels. Skull and upper cervical spine: No focal marrow lesion. Sinuses/Orbits: Visualized orbits demonstrate no acute abnormality. Minimal ethmoid sinus mucosal thickening. No significant mastoid effusion. Other: Partially calcified lesion within the left parietal scalp, likely reflecting a sebaceous cyst. IMPRESSION: 1. Punctate acute infarct questioned within the left parietal cortex as described. 2. Otherwise, no evidence of acute intracranial abnormality. 3. Minimal chronic small vessel ischemic disease. Electronically Signed   By: Kellie Simmering DO    On: 04/15/2019 14:50   Ct Head Code Stroke Wo Contrast  Result Date: 04/15/2019 CLINICAL DATA:  Code stroke.  68 year old female.  Resolved aphasia. EXAM: CT HEAD WITHOUT CONTRAST TECHNIQUE: Contiguous axial images were obtained from the base of the skull through the vertex without intravenous contrast. COMPARISON:  None. FINDINGS: Brain: Cerebral volume is within normal limits for age. No midline shift, ventriculomegaly, mass effect, evidence of mass lesion, intracranial hemorrhage or evidence of cortically based acute infarction. There is mild white matter hypodensity, including asymmetric left subinsular white matter involvement on series 3, image 18. No cytotoxic edema identified. Vascular: Calcified atherosclerosis at the skull base. No suspicious intracranial vascular hyperdensity. Skull: Negative. Sinuses/Orbits: Visualized paranasal sinuses and mastoids are clear. Other: Partially calcified benign-appearing left vertex scalp probable sebaceous cyst. Otherwise Visualized orbits and scalp soft tissues are within normal limits. ASPECTS [facility] Stroke Program Early CT Score) Total score (0-10 with 10 being normal): 10. IMPRESSION: 1. 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 No acute cortically based infarct or intracranial hemorrhage identified. 2. ASPECTS 10. 3. Study discussed by telephone with Dr. Roland Rack on 04/15/2019 at 12:56 . Electronically Signed   By: Genevie Ann M.D.   On: 04/15/2019 12:57     Labs:   Basic Metabolic Panel: Recent Labs  Lab 04/15/19 1240 04/15/19 1252 04/16/19 1304  NA 140 136  --   K 4.1 4.5  --   CL 107 109  --   CO2 25  --   --   GLUCOSE 98 96  --   BUN  22 29*  --   CREATININE 1.12* 1.00  --   CALCIUM 10.5*  --   --   MG  --   --  2.3  PHOS  --   --  1.7*   GFR Estimated Creatinine Clearance: 53.1 mL/min (by C-G formula based on SCr of 1 mg/dL). Liver Function Tests: Recent Labs  Lab 04/15/19 1240  AST 25  ALT 22  ALKPHOS 58  BILITOT 0.6    PROT 7.3  ALBUMIN 4.2   No results for input(s): LIPASE, AMYLASE in the last 168 hours. No results for input(s): AMMONIA in the last 168 hours. Coagulation profile Recent Labs  Lab 04/15/19 1240  INR 0.9    CBC: Recent Labs  Lab 04/15/19 1240 04/15/19 1252  WBC 6.7  --   NEUTROABS 4.0  --   HGB 14.0 14.6  HCT 42.9 43.0  MCV 92.9  --   PLT 258  --    Cardiac Enzymes: No results for input(s): CKTOTAL, CKMB, CKMBINDEX, TROPONINI in the last 168 hours. BNP: Invalid input(s): POCBNP CBG: Recent Labs  Lab 04/15/19 1240  GLUCAP 79   D-Dimer No results for input(s): DDIMER in the last 72 hours. Hgb A1c Recent Labs    04/16/19 0223  HGBA1C 5.5   Lipid Profile Recent Labs    04/16/19 0223  CHOL 212*  HDL 82  LDLCALC 118*  TRIG 62  CHOLHDL 2.6   Thyroid function studies Recent Labs    04/16/19 0910  TSH 2.139   Anemia work up No results for input(s): VITAMINB12, FOLATE, FERRITIN, TIBC, IRON, RETICCTPCT in the last 72 hours. Microbiology Recent Results (from the past 240 hour(s))  SARS CORONAVIRUS 2 (TAT 6-24 HRS) Nasopharyngeal Nasopharyngeal Swab     Status: None   Collection Time: 04/15/19  1:37 PM   Specimen: Nasopharyngeal Swab  Result Value Ref Range Status   SARS Coronavirus 2 NEGATIVE NEGATIVE Final    Comment: (NOTE) SARS-CoV-2 target nucleic acids are NOT DETECTED. The SARS-CoV-2 RNA is generally detectable in upper and lower respiratory specimens during the acute phase of infection. Negative results do not preclude SARS-CoV-2 infection, do not rule out co-infections with other pathogens, and should not be used as the sole basis for treatment or other patient management decisions. Negative results must be combined with clinical observations, patient history, and epidemiological information. The expected result is Negative. Fact Sheet for Patients: SugarRoll.be Fact Sheet for Healthcare  Providers: https://www.woods-mathews.com/ This test is not yet approved or cleared by the [place] FDA and  has been authorized for detection and/or diagnosis of SARS-CoV-2 by FDA under an Emergency Use Authorization (EUA). This EUA will remain  in effect (meaning this test can be used) for the duration of the COVID-19 declaration under Section 56 4(b)(1) of the Act, 21 U.S.C. section 360bbb-3(b)(1), unless the authorization is terminated or revoked sooner. Performed at [facility], [place] [address], [place], [place] [address]      Discharge Instructions:   Discharge Instructions    Ambulatory referral to Neurology   Complete by: As directed    Follow up in stroke clinic at [facility] with Frann Rider, NP in about 4 weeks. If not available, consider Dr. Antony Contras, Dr. Bess Harvest, or Dr. Sarina Ill.   Diet - low sodium heart healthy   Complete by: As directed    Discharge instructions   Complete by: As directed    Outpatient follow up with neurology TEE/lopp being arranged  outpatient as well   Increase activity slowly   Complete by: As directed      Allergies as of 04/17/2019   No Known Allergies     Medication List    STOP taking these medications   aspirin 81 MG tablet   diclofenac 50 MG EC tablet Commonly known as: VOLTAREN     TAKE these medications   atorvastatin 40 MG tablet Commonly known as: LIPITOR Take 1 tablet (40 mg total) by mouth daily at 6 PM.   calcium carbonate 200 MG capsule Take 250 mg by mouth 2 (two) times daily with a meal.   cetirizine 5 MG tablet Commonly known as: ZYRTEC Take 2.5-5 mg by mouth daily.   clonazePAM 0.5 MG tablet Commonly known as: KLONOPIN Take 0.25-0.5 mg by mouth at bedtime.   ferrous fumarate 325 (106 Fe) MG Tabs tablet Commonly known as: HEMOCYTE - 106 mg FE Take 1 tablet by mouth.   FISH OIL CONCENTRATE PO Take 1 tablet by mouth daily.   ibandronate 150  MG tablet Commonly known as: BONIVA Take 150 mg by mouth every 30 (thirty) days.   Investigational - Study Medication Per study   Investigational - Study Medication Per study   Investigational - Study Medication Per study   multivitamin-lutein Caps capsule Take 1 capsule by mouth daily.   VITAMIN C PO Take 1 tablet by mouth daily.   VITAMIN D (ERGOCALCIFEROL) PO Take 1 tablet by mouth daily.      Follow-up Information    [facility] Follow up in 4 week(s).   Specialty: Neurology Why: stroke clinic. office will call with appt date and time.  Contact information: [facility] [place] Follow up on 04/25/2019.   Why: 12:00PM. outpatient TEE study scheduled, need to arrive by 10:30AM [facility]. No food or drink after midnight in the morning of the test Contact information: [place] [id number]       Mayra Neer, MD Follow up in 1 week(s).   Specialty: Family Medicine Contact information: [address] [place] [address] [phone]            Time coordinating discharge: 35 min  Signed:  Geradine Girt DO  [facility] 04/17/2019, 9:32 AM

## 2019-04-17 NOTE — TOC Transition Note (Signed)
Transition of Care [facility]) - CM/SW Discharge Note   Patient Details  Name: Jacqueline Bruce MRN: [id number] Date of Birth: 1951-03-30  Transition of Care [facility]) CM/SW Contact:  Pollie Friar, RN Phone Number: 04/17/2019, 11:08 AM   Clinical Narrative:    Pt is discharging home with self care. No f/u per PT/OT and no DME needs. l Pt has hospital f/u, supervision at home and transportation to home.   Final next level of care: Home/Self Care Barriers to Discharge: No Barriers Identified   Patient Goals and CMS Choice        Discharge Placement                       Discharge Plan and Services                                     Social Determinants of Health (SDOH) Interventions     Readmission Risk Interventions No flowsheet data found.

## 2019-04-17 NOTE — Progress Notes (Signed)
Discharged to home after IV access removed, Study drugs obtained from pharmacy and sent with her, and discharge instructions reviewed with her.  All questions answered.

## 2019-04-18 ENCOUNTER — Other Ambulatory Visit (HOSPITAL_COMMUNITY): Payer: Self-pay | Admitting: Neurology

## 2019-04-18 MED ORDER — SIMVASTATIN 40 MG PO TABS: 40.0000 mg | ORAL_TABLET | Freq: Every evening | ORAL | 11 refills | Status: DC

## 2019-04-18 NOTE — Progress Notes (Signed)
I returned call from the patient who stated she had not received her statin prescription at her pharmacy and she preferred that be called [facility].  Patient expressed concern about taking Lipitor as her husband had problems with it and requested switching to simvastatin instead.  I sent a prescription of simvastatin 40 mg to her requested pharmacy electronically.  I discontinued Lipitor

## 2019-04-21 ENCOUNTER — Encounter: Payer: Self-pay | Admitting: *Deleted

## 2019-04-21 ENCOUNTER — Telehealth: Payer: Self-pay | Admitting: Internal Medicine

## 2019-04-21 NOTE — Telephone Encounter (Signed)
I agree she needs TEE and or loop

## 2019-04-21 NOTE — Telephone Encounter (Signed)
Spoke with patient at length and as she understands it the linq implant is off the table for now as she was enrolled in a study.  She is going to call Dr. Clydene Fake office tomorrow for clarification.  She is only down for TEE on 12/4.  I will call her back tomorrow after 1pm to discuss her plan of care and to get clarification from Neuro.

## 2019-04-21 NOTE — Telephone Encounter (Signed)
Patient has been contacted and is aware to have nothing to eat/drink after midnight and report at 11:00 am on 04/25/19. She was COVID screened while hospitalized and states other than the hospital she has been at home.  Will forward to Dyanne Carrel, NP and Dr. Leonie Man for any other instructions or potential re screen for covid since her screening was 04/15/19.  Also to Dr. Harrington Challenger as Juluis Rainier

## 2019-04-21 NOTE — Telephone Encounter (Signed)
Patient has a procedure on Friday for an TRANSESOPHAGEAL ECHOCARDIOGRAM (TEE). Patient states she was told the instructions for the procedure in the hospital but does not remember much of what was told to her. Asking for Dr. Harrington Challenger or her nurse to give her a call to go over the protocol.

## 2019-04-22 NOTE — Telephone Encounter (Signed)
No linq is not off the table and can be done. The stroke study has nothing to do with linq implant. She needs both

## 2019-04-22 NOTE — Telephone Encounter (Signed)
I spoke to the patient and clarified her confusion between the BMS stroke study and the need to get a TEE and loop recorder.  I explained that the 2 are mutually exclusive and she needs both TEE and loop recorder.  She stated she was willing to get the TEE done now but would like to postpone the loop recorder for later.  She was advised to continue the BMS stroke study medications and she will be called by research team to set up date 21 research follow-up visit.  She expressed understanding

## 2019-04-22 NOTE — Telephone Encounter (Signed)
Spoke with patient and let her know that Dr. Leonie Man said she needs both.  She is very frustrated with the entire process and still has many questions in regards to the study/linq.   She feels like she is not getting the answers she needs in regards to the study.  She really wants to have questions answered in regards to the study from Dr. Leonie Man.  She was under the impression that for the first 90 days she would not have to do the linq and would only participate in the study.  She has called the office today and really feels like she has gotten the run around and would like a call from Dr. Leonie Man himself. She did speak with someone from research today but says she is not going to discuss with anyone else until hearing from Dr Leonie Man.  She is aware that someone from the cardiology team will speak to her on Friday about the linq but she still has questions for Dr. Leonie Man.  I will forward to him for him to call her today.

## 2019-04-24 ENCOUNTER — Other Ambulatory Visit (HOSPITAL_COMMUNITY)
Admission: RE | Admit: 2019-04-24 | Discharge: 2019-04-24 | Disposition: A | Discharge status: Home / Self Care | Payer: Medicare Other | Source: Ambulatory Visit | Attending: Internal Medicine | Admitting: Internal Medicine

## 2019-04-24 DIAGNOSIS — Z20828 Contact with and (suspected) exposure to other viral communicable diseases: Secondary | ICD-10-CM | POA: Insufficient documentation

## 2019-04-24 DIAGNOSIS — Z01812 Encounter for preprocedural laboratory examination: Secondary | ICD-10-CM | POA: Insufficient documentation

## 2019-04-24 LAB — SARS CORONAVIRUS 2 (TAT 6-24 HRS): SARS Coronavirus 2: NEGATIVE

## 2019-04-24 NOTE — Telephone Encounter (Signed)
Spoke with patient and let her know it is necessary to have the [place] screen for her TEE tomorrow and that endo had called me to discuss.  I let Endo know that this was all arranged by Neuro and that the patient and had had discussed the COVID screen prior but she thought and discussed with Dr. Leonie Man that she had been quarantining since her last screen which was 04/15/19.  When the patient originally called I explained to her that the [place] screen she had done may need to be repeated as it would be too old.  She is going to go today and have a new COVID screen done.

## 2019-04-24 NOTE — Telephone Encounter (Signed)
Follow up:     Patient need to speak with some concering her appt tomorrow. Patient said she has some information but she is not sure if it right. Please call patient back.

## 2019-04-25 ENCOUNTER — Ambulatory Visit (HOSPITAL_COMMUNITY): Payer: Medicare Other | Admitting: Certified Registered"

## 2019-04-25 ENCOUNTER — Other Ambulatory Visit: Payer: Self-pay

## 2019-04-25 ENCOUNTER — Encounter (HOSPITAL_COMMUNITY)
Admission: RE | Disposition: A | Discharge status: Home / Self Care | Payer: Self-pay | Source: Home / Self Care | Attending: Internal Medicine

## 2019-04-25 ENCOUNTER — Ambulatory Visit (HOSPITAL_BASED_OUTPATIENT_CLINIC_OR_DEPARTMENT_OTHER)
Admission: RE | Admit: 2019-04-25 | Discharge: 2019-04-25 | Disposition: A | Discharge status: Home / Self Care | Payer: Medicare Other | Source: Ambulatory Visit | Attending: Physician Assistant | Admitting: Physician Assistant

## 2019-04-25 ENCOUNTER — Ambulatory Visit (HOSPITAL_COMMUNITY)
Admission: RE | Admit: 2019-04-25 | Discharge: 2019-04-25 | Disposition: A | Discharge status: Home / Self Care | Payer: Medicare Other | Attending: Internal Medicine | Admitting: Internal Medicine

## 2019-04-25 ENCOUNTER — Encounter (HOSPITAL_COMMUNITY): Payer: Self-pay

## 2019-04-25 DIAGNOSIS — G459 Transient cerebral ischemic attack, unspecified: Secondary | ICD-10-CM

## 2019-04-25 DIAGNOSIS — I639 Cerebral infarction, unspecified: Secondary | ICD-10-CM | POA: Diagnosis not present

## 2019-04-25 DIAGNOSIS — E785 Hyperlipidemia, unspecified: Secondary | ICD-10-CM | POA: Diagnosis not present

## 2019-04-25 DIAGNOSIS — R001 Bradycardia, unspecified: Secondary | ICD-10-CM | POA: Insufficient documentation

## 2019-04-25 DIAGNOSIS — F419 Anxiety disorder, unspecified: Secondary | ICD-10-CM | POA: Diagnosis not present

## 2019-04-25 DIAGNOSIS — M8580 Other specified disorders of bone density and structure, unspecified site: Secondary | ICD-10-CM | POA: Insufficient documentation

## 2019-04-25 DIAGNOSIS — I340 Nonrheumatic mitral (valve) insufficiency: Secondary | ICD-10-CM

## 2019-04-25 HISTORY — PX: TEE WITHOUT CARDIOVERSION: SHX5443

## 2019-04-25 HISTORY — PX: BUBBLE STUDY: SHX6837

## 2019-04-25 SURGERY — ECHOCARDIOGRAM, TRANSESOPHAGEAL
Anesthesia: Monitor Anesthesia Care

## 2019-04-25 MED ORDER — PROPOFOL 500 MG/50ML IV EMUL
INTRAVENOUS | Status: DC | PRN
  Administered 2019-04-25: 100 ug/kg/min via INTRAVENOUS

## 2019-04-25 MED ORDER — LACTATED RINGERS IV SOLN
INTRAVENOUS | Status: DC
  Administered 2019-04-25: 12:00:00 via INTRAVENOUS

## 2019-04-25 MED ORDER — SODIUM CHLORIDE 0.9 % IV SOLN: INTRAVENOUS | Status: DC

## 2019-04-25 MED ORDER — PROPOFOL 10 MG/ML IV BOLUS
INTRAVENOUS | Status: DC | PRN
  Administered 2019-04-25 (×3): 20 mg via INTRAVENOUS

## 2019-04-25 MED ORDER — LIDOCAINE VISCOUS HCL 2 % MT SOLN
OROMUCOSAL | Status: AC
  Filled 2019-04-25: qty 15

## 2019-04-25 NOTE — Discharge Instructions (Signed)

## 2019-04-25 NOTE — Anesthesia Preprocedure Evaluation (Addendum)
Anesthesia Evaluation  Patient identified by MRN, date of birth, ID band Patient awake    Reviewed: Allergy & Precautions, NPO status , Patient's Chart, lab work & pertinent test results  Airway Mallampati: II  TM Distance: >3 FB Neck ROM: Full    Dental no notable dental hx.    Pulmonary neg pulmonary ROS,    Pulmonary exam normal breath sounds clear to auscultation       Cardiovascular negative cardio ROS Normal cardiovascular exam Rhythm:Regular Rate:Normal  ECG: SR, rate 50  ECHO: 1. Left ventricular ejection fraction, by visual estimation, is 60 to 65%. The left ventricle has normal function. There is no left ventricular hypertrophy. 2. The left ventricle has no regional wall motion abnormalities. 3. Global right ventricle has normal systolic function.The right ventricular size is normal. No increase in right ventricular wall thickness. 4. Left atrial size was normal. 5. Right atrial size was normal. 6. The mitral valve is normal in structure. No evidence of mitral valve regurgitation. No evidence of mitral stenosis. 7. The tricuspid valve is normal in structure. Tricuspid valve regurgitation is not demonstrated. 8. The aortic valve is normal in structure. Aortic valve regurgitation is not visualized. No evidence of aortic valve sclerosis or stenosis. 9. The pulmonic valve was normal in structure. Pulmonic valve regurgitation is not visualized. 10. The inferior vena cava is normal in size with greater than 50% respiratory variability, suggesting right atrial pressure of 3 mmHg.   Neuro/Psych Anxiety TIACVA, Residual Symptoms    GI/Hepatic negative GI ROS, Neg liver ROS,   Endo/Other  negative endocrine ROS  Renal/GU negative Renal ROS     Musculoskeletal negative musculoskeletal ROS (+)   Abdominal   Peds  Hematology negative hematology ROS (+) HLD   Anesthesia Other Findings stroke  Reproductive/Obstetrics                            Anesthesia Physical Anesthesia Plan  ASA: III  Anesthesia Plan: MAC   Post-op Pain Management:    Induction:   PONV Risk Score and Plan: 2 and Propofol infusion and Treatment may vary due to age or medical condition  Airway Management Planned: Nasal Cannula  Additional Equipment:   Intra-op Plan:   Post-operative Plan:   Informed Consent:     Dental advisory given  Plan Discussed with: CRNA  Anesthesia Plan Comments:        Anesthesia Quick Evaluation

## 2019-04-25 NOTE — Transfer of Care (Signed)
Immediate Anesthesia Transfer of Care Note  Patient: Jacqueline Bruce  Procedure(s) Performed: TRANSESOPHAGEAL ECHOCARDIOGRAM (TEE) (N/A ) BUBBLE STUDY  Patient Location: Endoscopy Unit  Anesthesia Type:MAC  Level of Consciousness: awake and patient cooperative  Airway & Oxygen Therapy: Patient Spontanous Breathing  Post-op Assessment: Report given to RN, Post -op Vital signs reviewed and stable and Patient moving all extremities  Post vital signs: Reviewed and stable  Last Vitals:  Vitals Value Taken Time  BP 101/71 04/25/19 1213  Temp 36.4 C 04/25/19 1213  Pulse 51 04/25/19 1215  Resp 14 04/25/19 1215  SpO2 99 % 04/25/19 1215  Vitals shown include unvalidated device data.  Last Pain:  Vitals:   04/25/19 1213  TempSrc: Temporal  PainSc: 0-No pain         Complications: No apparent anesthesia complications

## 2019-04-25 NOTE — Interval H&P Note (Signed)
History and Physical Interval Note:  04/25/2019 11:22 AM  Jacqueline Bruce  has presented today for surgery, with the diagnosis of stroke.  The various methods of treatment have been discussed with the patient and family. After consideration of risks, benefits and other options for treatment, the patient has consented to  Procedure(s): TRANSESOPHAGEAL ECHOCARDIOGRAM (TEE) (N/A) as a surgical intervention.  The patient's history has been reviewed, patient examined, no change in status, stable for surgery.  I have reviewed the patient's chart and labs.  Questions were answered to the patient's satisfaction.     [name]

## 2019-04-25 NOTE — Anesthesia Postprocedure Evaluation (Signed)
Anesthesia Post Note  Patient: Jacqueline Bruce  Procedure(s) Performed: TRANSESOPHAGEAL ECHOCARDIOGRAM (TEE) (N/A ) BUBBLE STUDY     Patient location during evaluation: Endoscopy Anesthesia Type: MAC Level of consciousness: awake and alert Pain management: pain level controlled Vital Signs Assessment: post-procedure vital signs reviewed and stable Respiratory status: spontaneous breathing, nonlabored ventilation, respiratory function stable and patient connected to nasal cannula oxygen Cardiovascular status: stable and blood pressure returned to baseline Postop Assessment: no apparent nausea or vomiting Anesthetic complications: no    Last Vitals:  Vitals:   04/25/19 1223 04/25/19 1234  BP: (!) 116/57 (!) 116/56  Pulse: (!) 44 (!) 43  Resp: 16 14  Temp:    SpO2: 98% 100%    Last Pain:  Vitals:   04/25/19 1223  TempSrc:   PainSc: 0-No pain                 Khianna Blazina P Amada Hallisey

## 2019-04-25 NOTE — Progress Notes (Signed)
  Echocardiogram Echocardiogram Transesophageal has been performed.  Finnlee Silvernail A Rashidah [place] 04/25/2019, 12:33 PM

## 2019-04-25 NOTE — Progress Notes (Signed)
Discussed LINQ with patient in depth.   She had a TIA concerning for embolic cause. She has no known personal history of atrial fibrillation. EKGs reviewed and show no atrial fibrillation. Echo 04/15/2019 shows EF 60-65% with no obvious source of thrombus. She underwent TEE today, with formal results pending at this time.  Reviewed risks, benefits, and goals of an implantable loop recorder.  She verbalizes understanding, but would like to think on this and see Dr. Lovena Le as an outpatient for further discussion and possible implantation.   Discussed above with Dr. Lovena Le. She is currently scheduled for consultation and possible implant on 05/29/2019. Opted for appointment after Christmas, as she was hospitalized during Thanksgiving.   Legrand Como [address]" [place], PA-C  04/25/2019 1:03 PM

## 2019-04-25 NOTE — CV Procedure (Signed)
TEE  Patient sedated by anesthesia with Propofol IV  Bite guard placed in mouth   TEE probe advanced to mid esophagus without diffficulty   LA, LAA without masses No PFO by color doppler or with injeciton of agitated saline   TV normal  Mild TR AV normal  No AI MV normal   Mild MR PV normal  Triv PI  LVEF and RVEF normal  Minimal fixed plaquing of the thoracic aorta   Full report to follow   Procedure without complication   [name] MD

## 2019-04-28 ENCOUNTER — Encounter (HOSPITAL_COMMUNITY): Payer: Self-pay | Admitting: Internal Medicine

## 2019-04-29 ENCOUNTER — Telehealth (INDEPENDENT_AMBULATORY_CARE_PROVIDER_SITE_OTHER): Payer: Medicare Other

## 2019-04-29 NOTE — Telephone Encounter (Signed)
Called patient to review TEE results and patient wanted to know if her heart is good then why is she still short of breath. She wanted to know if she should see a lung doctor. I advised patient to contact her pcp to request more testing or for a referral to pulmonary.   Is there anything else I should have told the patient?

## 2019-05-01 NOTE — Telephone Encounter (Signed)
I agree. Thanks.

## 2019-05-13 ENCOUNTER — Telehealth: Payer: Self-pay | Admitting: Internal Medicine

## 2019-05-13 NOTE — Telephone Encounter (Signed)
Patient calling wanting to speak with someone about her appointment on 05/29/19. She wants to know more information on the implant and what will happen during the appointment.

## 2019-05-14 NOTE — Telephone Encounter (Signed)
Returned call to pt.  Pt was concerned because she did not think Dr. Lovena Le was going to consult with her prior to implant.  Advised her appt was a consult with possible loop placement.  Pt relieved.

## 2019-05-19 ENCOUNTER — Encounter: Payer: Self-pay | Admitting: *Deleted

## 2019-05-29 ENCOUNTER — Ambulatory Visit: Payer: Medicare Other | Admitting: Internal Medicine

## 2019-05-29 ENCOUNTER — Encounter: Payer: Self-pay | Admitting: Internal Medicine

## 2019-05-29 ENCOUNTER — Other Ambulatory Visit: Payer: Self-pay

## 2019-05-29 DIAGNOSIS — I639 Cerebral infarction, unspecified: Secondary | ICD-10-CM | POA: Diagnosis not present

## 2019-05-29 NOTE — Progress Notes (Signed)
HPI Jacqueline Bruce is referred today for evaluation of a TIA to consider ILR insertion. She is a pleasant and healthy woman who was at [facility] and developed an expressive aphasia. By the time she had gotten to the ER her symptoms had resolved. (40 minutes). Workup was unremarkable and she has had a TEE which was negative. She presents today to consider ILR insertion. She does not have a h/o atrial fib. No syncope. No chest pain or sob. No edema.  No Known Allergies   Current Outpatient Medications  Medication Sig Dispense Refill  . Ascorbic Acid (VITAMIN C PO) Take 1 tablet by mouth daily.    . calcium carbonate 200 MG capsule Take 250 mg by mouth 2 (two) times daily with a meal.    . cetirizine (ZYRTEC) 5 MG tablet Take 2.5-5 mg by mouth daily.     . clonazePAM (KLONOPIN) 0.5 MG tablet Take 0.25-0.5 mg by mouth at bedtime.     . ferrous fumarate (HEMOCYTE - 106 MG FE) 325 (106 FE) MG TABS tablet Take 1 tablet by mouth.    . ibandronate (BONIVA) 150 MG tablet Take 150 mg by mouth every 30 (thirty) days.    . Investigational - Study Medication Per study 1 each PRN  . Investigational - Study Medication Per study 1 each PRN  . Investigational - Study Medication Per study 1 each PRN  . multivitamin-lutein (OCUVITE-LUTEIN) CAPS capsule Take 1 capsule by mouth daily.    . Omega-3 Fatty Acids (FISH OIL CONCENTRATE PO) Take 1 tablet by mouth daily.     . simvastatin (ZOCOR) 40 MG tablet Take 1 tablet (40 mg total) by mouth every evening. 30 tablet 11  . VITAMIN D, ERGOCALCIFEROL, PO Take 1 tablet by mouth daily.      No current facility-administered medications for this visit.     Past Medical History:  Diagnosis Date  . Acute CVA (cerebrovascular accident) ([place]) 04/16/2019  . Allergy   . Anxiety 04/15/2019  . Bradycardia with 41-50 beats per minute 04/15/2019  . Osteopenia 04/15/2019  . Slurred speech 04/15/2019  . 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   Past Surgical History:  Procedure Laterality Date  . BUBBLE STUDY  04/25/2019   Procedure: BUBBLE STUDY;  Surgeon: Fay Records, MD;  Location: [facility];  Service: Cardiovascular;;  . TEE WITHOUT CARDIOVERSION N/A 04/25/2019   Procedure: TRANSESOPHAGEAL ECHOCARDIOGRAM (TEE);  Surgeon: Fay Records, MD;  Location: [facility] ENDOSCOPY;  Service: Cardiovascular;  Laterality: N/A;     Family History  Problem Relation Age of Onset  . Hypertension Mother   . Hypertension Father      Social History   Socioeconomic History  . Marital status: Married    Spouse name: Not on file  . Number of children: Not on file  . Years of education: Not on file  . Highest education level: Not on file  Occupational History  . Not on file  Tobacco Use  . Smoking status: Never Smoker  . Smokeless tobacco: Never Used  Substance and Sexual Activity  . Alcohol use: No  . Drug use: No  . Sexual activity: Not on file  Other Topics Concern  . Not on file  Social History Narrative  . Not on file   Social Determinants of Health   Financial Resource Strain:   . Difficulty of Paying  Living Expenses: Not on file  Food Insecurity:   . Worried About Charity fundraiser in the Last Year: Not on file  . Ran Out of Food in the Last Year: Not on file  Transportation Needs:   . Lack of Transportation (Medical): Not on file  . Lack of Transportation (Non-Medical): Not on file  Physical Activity:   . Days of Exercise per Week: Not on file  . Minutes of Exercise per Session: Not on file  Stress:   . Feeling of Stress : Not on file  Social Connections:   . Frequency of Communication with Friends and Family: Not on file  . Frequency of Social Gatherings with Friends and Family: Not on file  . Attends Religious Services: Not on file  . Active Member of Clubs or Organizations: Not on file  . Attends Archivist Meetings: Not on file  . Marital  Status: Not on file  Intimate Partner Violence:   . Fear of Current or Ex-Partner: Not on file  . Emotionally Abused: Not on file  . Physically Abused: Not on file  . Sexually Abused: Not on file     BP 108/62   Pulse (!) 58   Ht 5\' 7"  (1.702 m)   Wt 158 lb (71.7 kg)   SpO2 98%   BMI 24.75 kg/m   Physical Exam:  Well appearing NAD HEENT: Unremarkable Neck:  No JVD, no thyromegally Lymphatics:  No adenopathy Back:  No CVA tenderness Lungs:  Clear with no wheezes HEART:  Regular rate rhythm, no murmurs, no rubs, no clicks Abd:  soft, positive bowel sounds, no organomegally, no rebound, no guarding Ext:  2 plus pulses, no edema, no cyanosis, no clubbing Skin:  No rashes no nodules Neuro:  CN II through XII intact, motor grossly intact  EKG - sinus bradycardia  Assess/Plan: 1. TIA - I have discussed the treatment options and the possible etiologies of her TIA. Atrial fib is a distinct possibility. The risks/benefits/goals/expectations of ILR insertion were reviewed and she wishes to proceed.  EP Procedure Note  After informed consent was obtained the patient was prepped and draped in a sterile fashion. 10 cc of lidocaine was infiltrated into the pectoral region. A one cm stab incision was carried out. The medtronic ILR, # [id number] was inserted. The R waves measured 1.2 mV. Benzoin and steristrips were painted on the skin. A bandage was applied and pressure held. She was recovered in the usual manner.   Complications: none immediately  Conclusion: successful insertion of a medtronic ILR in a patient with a prior unexplained TIA.  Mikle Bosworth.D.

## 2019-05-29 NOTE — Patient Instructions (Addendum)
Medication Instructions:  Your physician recommends that you continue on your current medications as directed. Please refer to the Current Medication list given to you today.  Labwork: None ordered.  Testing/Procedures: None ordered.  Follow-Up:  You will have a virtual visit with device clinic for a wound check.   June 05, 2019 at 3:30 pm via MyChart.  Your physician wants you to follow-up in: 6 months with Dr. Lovena Le.   You will receive a reminder letter in the mail two months in advance. If you don't receive a letter, please call our office to schedule the follow-up appointment.  Any Other Special Instructions Will Be Listed Below (If Applicable).  If you need a refill on your cardiac medications before your next appointment, please call your pharmacy.    Implantable Loop Recorder Placement, Care After This sheet gives you information about how to care for yourself after your procedure. Your health care provider may also give you more specific instructions. If you have problems or questions, contact your health care provider. What can I expect after the procedure? After the procedure, it is common to have:  Soreness or discomfort near the incision.  Some swelling or bruising near the incision. Follow these instructions at home: Incision care   Follow instructions from your health care provider about how to take care of your incision. Make sure you: ? Leave your outer dressing on for 72 hours.  After 72 hours you can remove that and shower. ? Leave adhesive strips in place. These skin closures may need to stay in place for 2 weeks or longer. If adhesive strip edges start to loosen and curl up, you may trim the loose edges. Do not remove adhesive strips completely unless your health care provider tells you to do that.  Check your incision area every day for signs of infection. Check for: ? Redness, swelling, or pain. ? Fluid or blood. ? Warmth. ? Pus or a bad smell. Do  not take baths, swim, or use a hot tub until your incision is completely healed.  Activity  Return to your normal activities.  General instructions  Follow instructions from your health care provider about how to manage your implantable loop recorder and transmit the information. Learn how to activate a recording if this is necessary for your type of device.  Do not go through a metal detection gate, and do not let someone hold a metal detector over your chest. Show your ID card.  Do not have an MRI unless you check with your health care provider first.  Take over-the-counter and prescription medicines only as told by your health care provider.  Keep all follow-up visits as told by your health care provider. This is important. Contact a health care provider if:  You have redness, swelling, or pain around your incision.  You have a fever.  You have pain that is not relieved by your pain medicine.  You have triggered your device because of fainting (syncope) or because of a heartbeat that feels like it is racing, slow, fluttering, or skipping (palpitations). Get help right away if you have:  Chest pain.  Difficulty breathing. Summary  After the procedure, it is common to have soreness or discomfort near the incision.  Change your dressing as told by your health care provider.  Follow instructions from your health care provider about how to manage your implantable loop recorder and transmit the information.  Keep all follow-up visits as told by your health care provider. This is  important. This information is not intended to replace advice given to you by your health care provider. Make sure you discuss any questions you have with your health care provider. Document Released: 04/19/2015 Document Revised: 06/23/2017 Document Reviewed: 06/23/2017 Elsevier Patient Education  2020 [employer].

## 2019-06-05 ENCOUNTER — Telehealth: Payer: Self-pay | Admitting: Emergency Medicine

## 2019-06-05 ENCOUNTER — Telehealth (INDEPENDENT_AMBULATORY_CARE_PROVIDER_SITE_OTHER): Payer: Medicare Other | Admitting: *Deleted

## 2019-06-05 DIAGNOSIS — I639 Cerebral infarction, unspecified: Secondary | ICD-10-CM

## 2019-06-10 ENCOUNTER — Telehealth: Payer: Self-pay | Admitting: Internal Medicine

## 2019-06-10 NOTE — Telephone Encounter (Signed)
After reviewing the chart, looks like pt may be referring to her appt 06/05/19 which states in the appt that sh left. I will route this to device clinic as that is where the appt was on 06/05/19.

## 2019-06-10 NOTE — Telephone Encounter (Signed)
Patient calling stating she got a message in [place] saying she missed her appointment, but she states she did not. Patient also states she would like to make sure her information is sent to her PCP Dr. Brigitte Pulse at [facility] to keep them updated on her health. She would like a nurse to call her back and says if she does not pick up to leave a message.

## 2019-06-10 NOTE — Telephone Encounter (Signed)
Spoke with pt, advised that 06/05/19 visit has not been closed yet, once the visit is closed it will be copied to PCP.

## 2019-06-26 NOTE — Progress Notes (Signed)
Steri strips removed prior to video visit. Wound site well healed with no redness, drainage or bruising.

## 2019-07-01 ENCOUNTER — Ambulatory Visit (INDEPENDENT_AMBULATORY_CARE_PROVIDER_SITE_OTHER): Payer: Medicare Other | Admitting: *Deleted

## 2019-07-01 DIAGNOSIS — I639 Cerebral infarction, unspecified: Secondary | ICD-10-CM | POA: Diagnosis not present

## 2019-07-01 LAB — CUP PACEART REMOTE DEVICE CHECK
Date Time Interrogation Session: 20210209110607
Implantable Pulse Generator Implant Date: 20210107

## 2019-07-02 ENCOUNTER — Telehealth: Payer: Self-pay | Admitting: Emergency Medicine

## 2019-07-02 NOTE — Progress Notes (Signed)
ILR Remote 

## 2019-07-02 NOTE — Telephone Encounter (Signed)
Opened in error

## 2019-07-02 NOTE — Telephone Encounter (Signed)
Patient called with questions concerning appointments and documentation in my Chart. Questions answered. LINQ II monthly report reviewed with patient.  Provided direct phone number for device clinic for any  future questions or concerns that may arise.

## 2019-07-10 ENCOUNTER — Other Ambulatory Visit (HOSPITAL_COMMUNITY): Payer: Self-pay | Admitting: Neurology

## 2019-07-10 DIAGNOSIS — I6300 Cerebral infarction due to thrombosis of unspecified precerebral artery: Secondary | ICD-10-CM

## 2019-07-20 ENCOUNTER — Ambulatory Visit (HOSPITAL_COMMUNITY)
Admission: RE | Admit: 2019-07-20 | Discharge: 2019-07-20 | Disposition: A | Discharge status: Home / Self Care | Payer: Medicare Other | Source: Ambulatory Visit | Attending: Neurology | Admitting: Neurology

## 2019-07-20 ENCOUNTER — Other Ambulatory Visit: Payer: Self-pay

## 2019-07-20 DIAGNOSIS — I6300 Cerebral infarction due to thrombosis of unspecified precerebral artery: Secondary | ICD-10-CM | POA: Insufficient documentation

## 2019-07-20 IMAGING — MR MR HEAD W/O CM
14 series · 48 of 48 positions shown · non-contrast
Comparison: MRI head [DATE]

CLINICAL DATA: Follow-up stroke

EXAM:
MRI HEAD WITHOUT CONTRAST
TECHNIQUE: Multiplanar, multiecho pulse sequences of the brain and surrounding
structures were obtained without intravenous contrast.

[Series 1: (id) loc ssfse · axial · 5.0mm · 1.02mm/px · z∈[-35,+129]mm · 4 of 33 slices shown (1 of 2)]
[im 1/33]
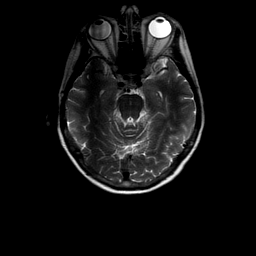
[im 11/33]
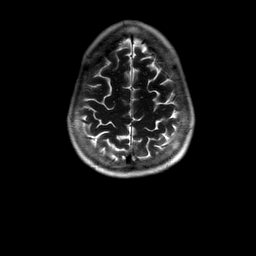
[im 22/33]
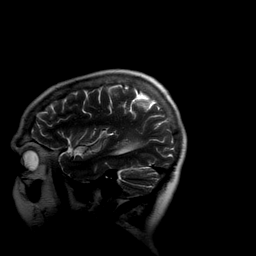
[im 33/33]
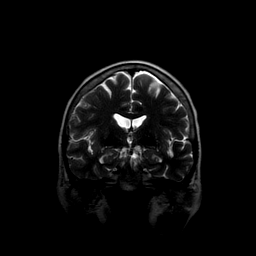

[Series 2: DWI · axial · 5.0mm · 0.94mm/px · z∈[-99,+66]mm · 7 of 68 slices shown (1 of 2)]
[im 1/68]
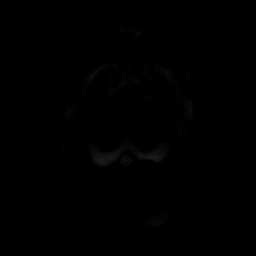
[im 12/68]
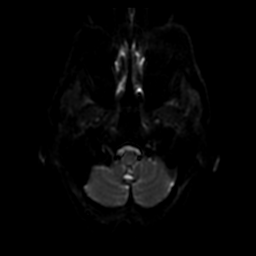
[im 23/68]
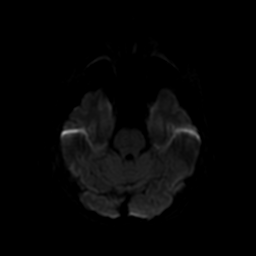
[im 34/68]
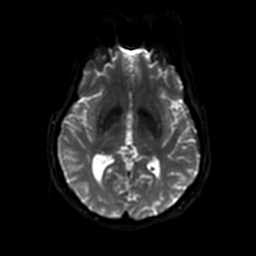
[im 45/68]
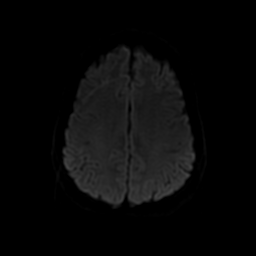
[im 56/68]
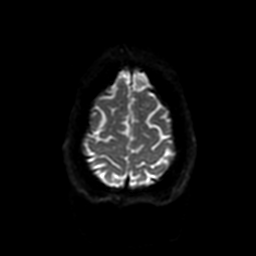
[im 68/68]
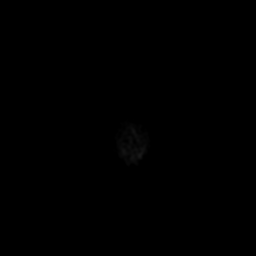

[Series 3: FLAIR · axial · 5.0mm · 0.94mm/px · 1 of 16 slices shown (1 of 2)]
[im 1/16]
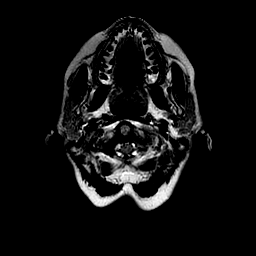

[Series 4: (id) loc ssfse · axial · 5.0mm · 1.02mm/px · z∈[-35,+129]mm · 3 of 33 slices shown (2 of 2)]
[im 1/33]
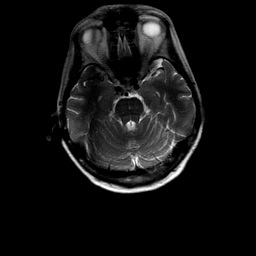
[im 17/33]
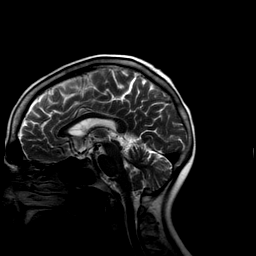
[im 33/33]
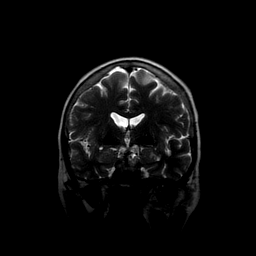

[Series 5: DWI · axial · 5.0mm · 0.94mm/px · z∈[-99,+66]mm · 6 of 67 slices shown (2 of 2)]
[im 1/67]
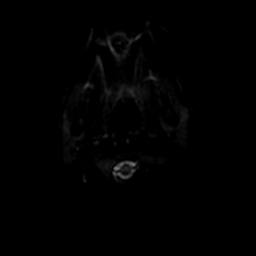
[im 14/67]
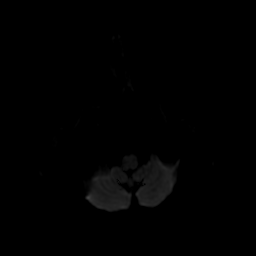
[im 27/67]
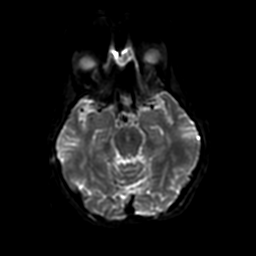
[im 40/67]
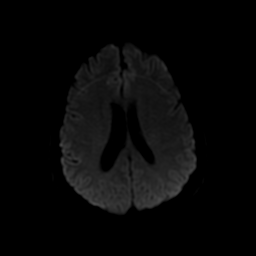
[im 53/67]
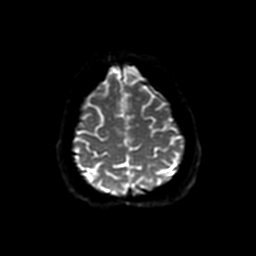
[im 67/67]
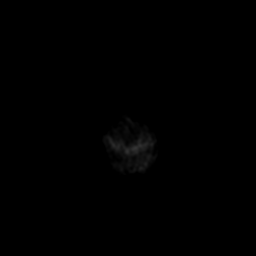

[Series 6: FLAIR · axial · 5.0mm · 0.94mm/px · z∈[-94,+61]mm · 3 of 32 slices shown (2 of 2)]
[im 1/32]
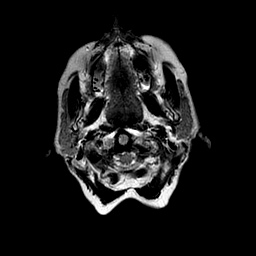
[im 16/32]
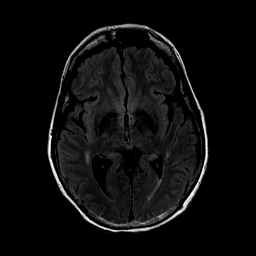
[im 32/32]
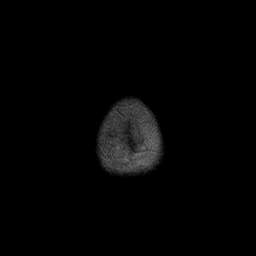

[Series 7: T2-star · axial · 5.0mm · 0.94mm/px · z∈[-94,+61]mm · 3 of 32 slices shown (1 of 2)]
[im 1/32]
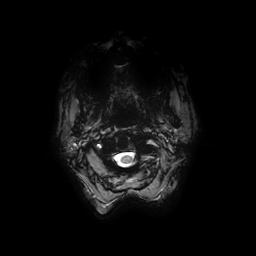
[im 16/32]
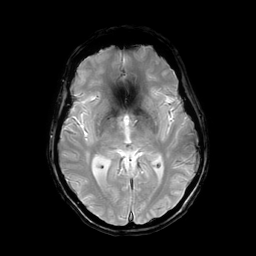
[im 32/32]
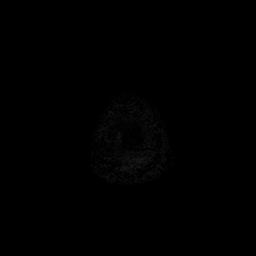

[Series 8: T1 · axial · 5.0mm · 0.94mm/px · z∈[-94,+61]mm · 3 of 32 slices shown (1 of 2)]
[im 1/32]
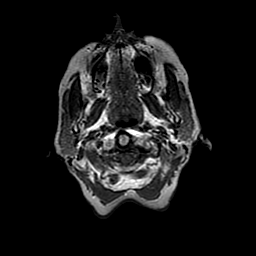
[im 16/32]
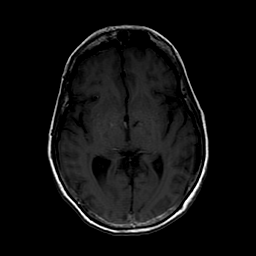
[im 32/32]
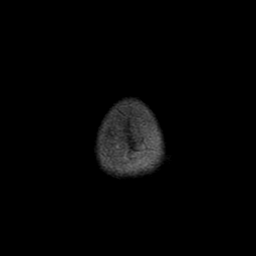

[Series 250: ADC · axial · 5.0mm · 0.94mm/px · z∈[-99,+66]mm · 3 of 34 slices shown (1 of 2)]
[im 1/34]
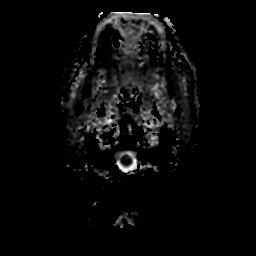
[im 17/34]
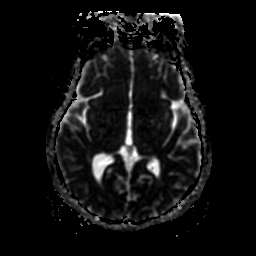
[im 34/34]
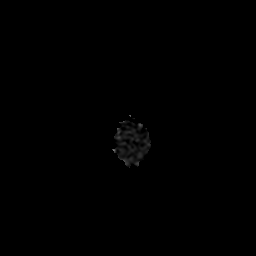

[Series 251: eadc(no-q) · axial · 5.0mm · 0.94mm/px · z∈[-99,+66]mm · 3 of 34 slices shown (1 of 2)]
[im 1/34]
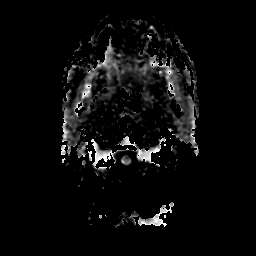
[im 17/34]
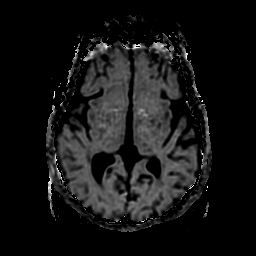
[im 34/34]
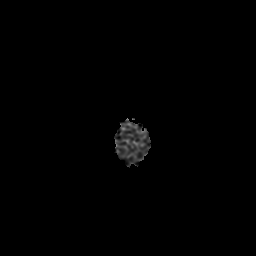

[Series 550: ADC · axial · 5.0mm · 0.94mm/px · z∈[-99,+66]mm · 3 of 34 slices shown (2 of 2)]
[im 1/34]
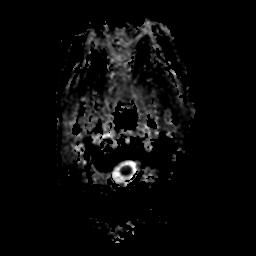
[im 17/34]
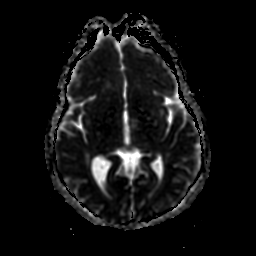
[im 34/34]
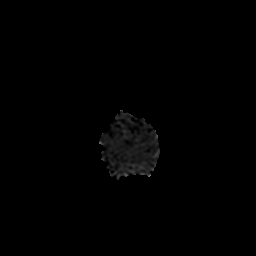

[Series 551: eadc(no-q) · axial · 5.0mm · 0.94mm/px · z∈[-99,+66]mm · 3 of 34 slices shown (2 of 2)]
[im 1/34]
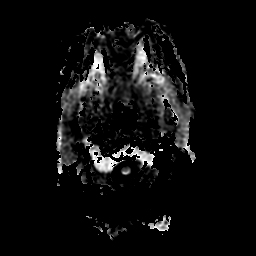
[im 17/34]
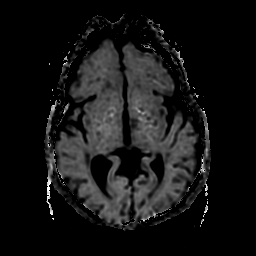
[im 34/34]
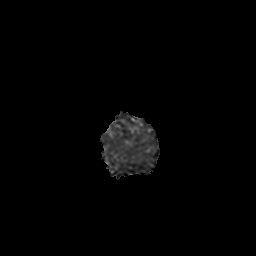

[T2-star · axial · 5.0mm · 0.94mm/px · z∈[-94,+61]mm · 3 of 32 slices shown (2 of 2)]
[im 1/32]
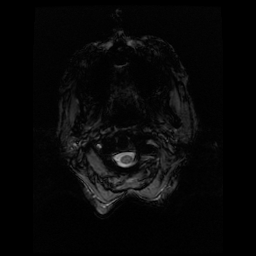
[im 16/32]
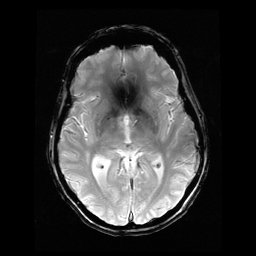
[im 32/32]
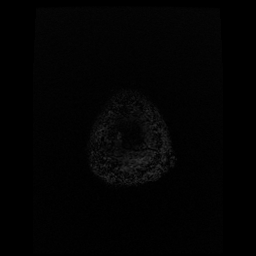

[T1 · axial · 5.0mm · 0.94mm/px · z∈[-94,+61]mm · 3 of 32 slices shown (2 of 2)]
[im 1/32]
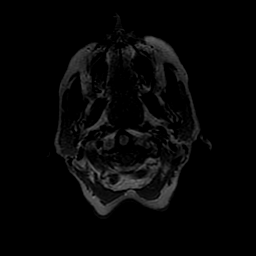
[im 16/32]
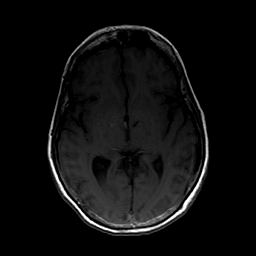
[im 32/32]
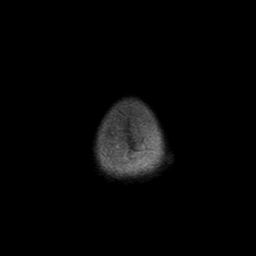

[48 of 48 positions shown; findings below may reference images not displayed]

FINDINGS: Brain: Limited exam per protocol.

Ventricle size normal. Scattered small white matter hyperintensities
bilaterally unchanged from the prior study. Negative for hemorrhage
or mass.

Diffusion-weighted imaging not reveal any evidence of acute infarct.
Punctate area of acute infarct in the left parietal lobe posteriorly
on the prior study has resolved.

Vascular: Normal arterial flow voids.

Skull and upper cervical spine: Negative

Sinuses/Orbits: Negative

Other: None
IMPRESSION: Mild chronic microvascular ischemia in the white matter. No acute
infarct. Previously noted left parietal punctate infarct no longer
identified on diffusion imaging.

## 2019-07-21 NOTE — Progress Notes (Signed)
Kindly inform the patient that stroke study specific MRI scan of the brain shows stable appearance of her previous stroke.  No new or worrisome finding.

## 2019-08-01 ENCOUNTER — Ambulatory Visit (INDEPENDENT_AMBULATORY_CARE_PROVIDER_SITE_OTHER): Payer: Medicare Other | Admitting: *Deleted

## 2019-08-01 DIAGNOSIS — I639 Cerebral infarction, unspecified: Secondary | ICD-10-CM | POA: Diagnosis not present

## 2019-08-02 LAB — CUP PACEART REMOTE DEVICE CHECK
Date Time Interrogation Session: 20210312110622
Implantable Pulse Generator Implant Date: 20210107

## 2019-09-01 ENCOUNTER — Ambulatory Visit (INDEPENDENT_AMBULATORY_CARE_PROVIDER_SITE_OTHER): Payer: Medicare Other | Admitting: *Deleted

## 2019-09-01 DIAGNOSIS — I639 Cerebral infarction, unspecified: Secondary | ICD-10-CM | POA: Diagnosis not present

## 2019-09-01 LAB — CUP PACEART REMOTE DEVICE CHECK
Date Time Interrogation Session: 20210412110330
Implantable Pulse Generator Implant Date: 20210107

## 2019-09-02 NOTE — Progress Notes (Signed)
ILR Remote 

## 2019-10-01 ENCOUNTER — Other Ambulatory Visit: Payer: Self-pay

## 2019-10-01 ENCOUNTER — Other Ambulatory Visit: Payer: Self-pay | Admitting: Physician Assistant

## 2019-10-01 ENCOUNTER — Ambulatory Visit
Admission: RE | Admit: 2019-10-01 | Discharge: 2019-10-01 | Disposition: A | Discharge status: Home / Self Care | Payer: Medicare Other | Source: Ambulatory Visit | Attending: Physician Assistant | Admitting: Physician Assistant

## 2019-10-01 DIAGNOSIS — R0602 Shortness of breath: Secondary | ICD-10-CM

## 2019-10-01 IMAGING — CR DG CHEST 2V
2 series · 2 of 2 positions shown · non-contrast
Comparison: None.

CLINICAL DATA: 69-year-old with chronic shortness of breath.

EXAM:
CHEST - 2 VIEW

[w chest pa]
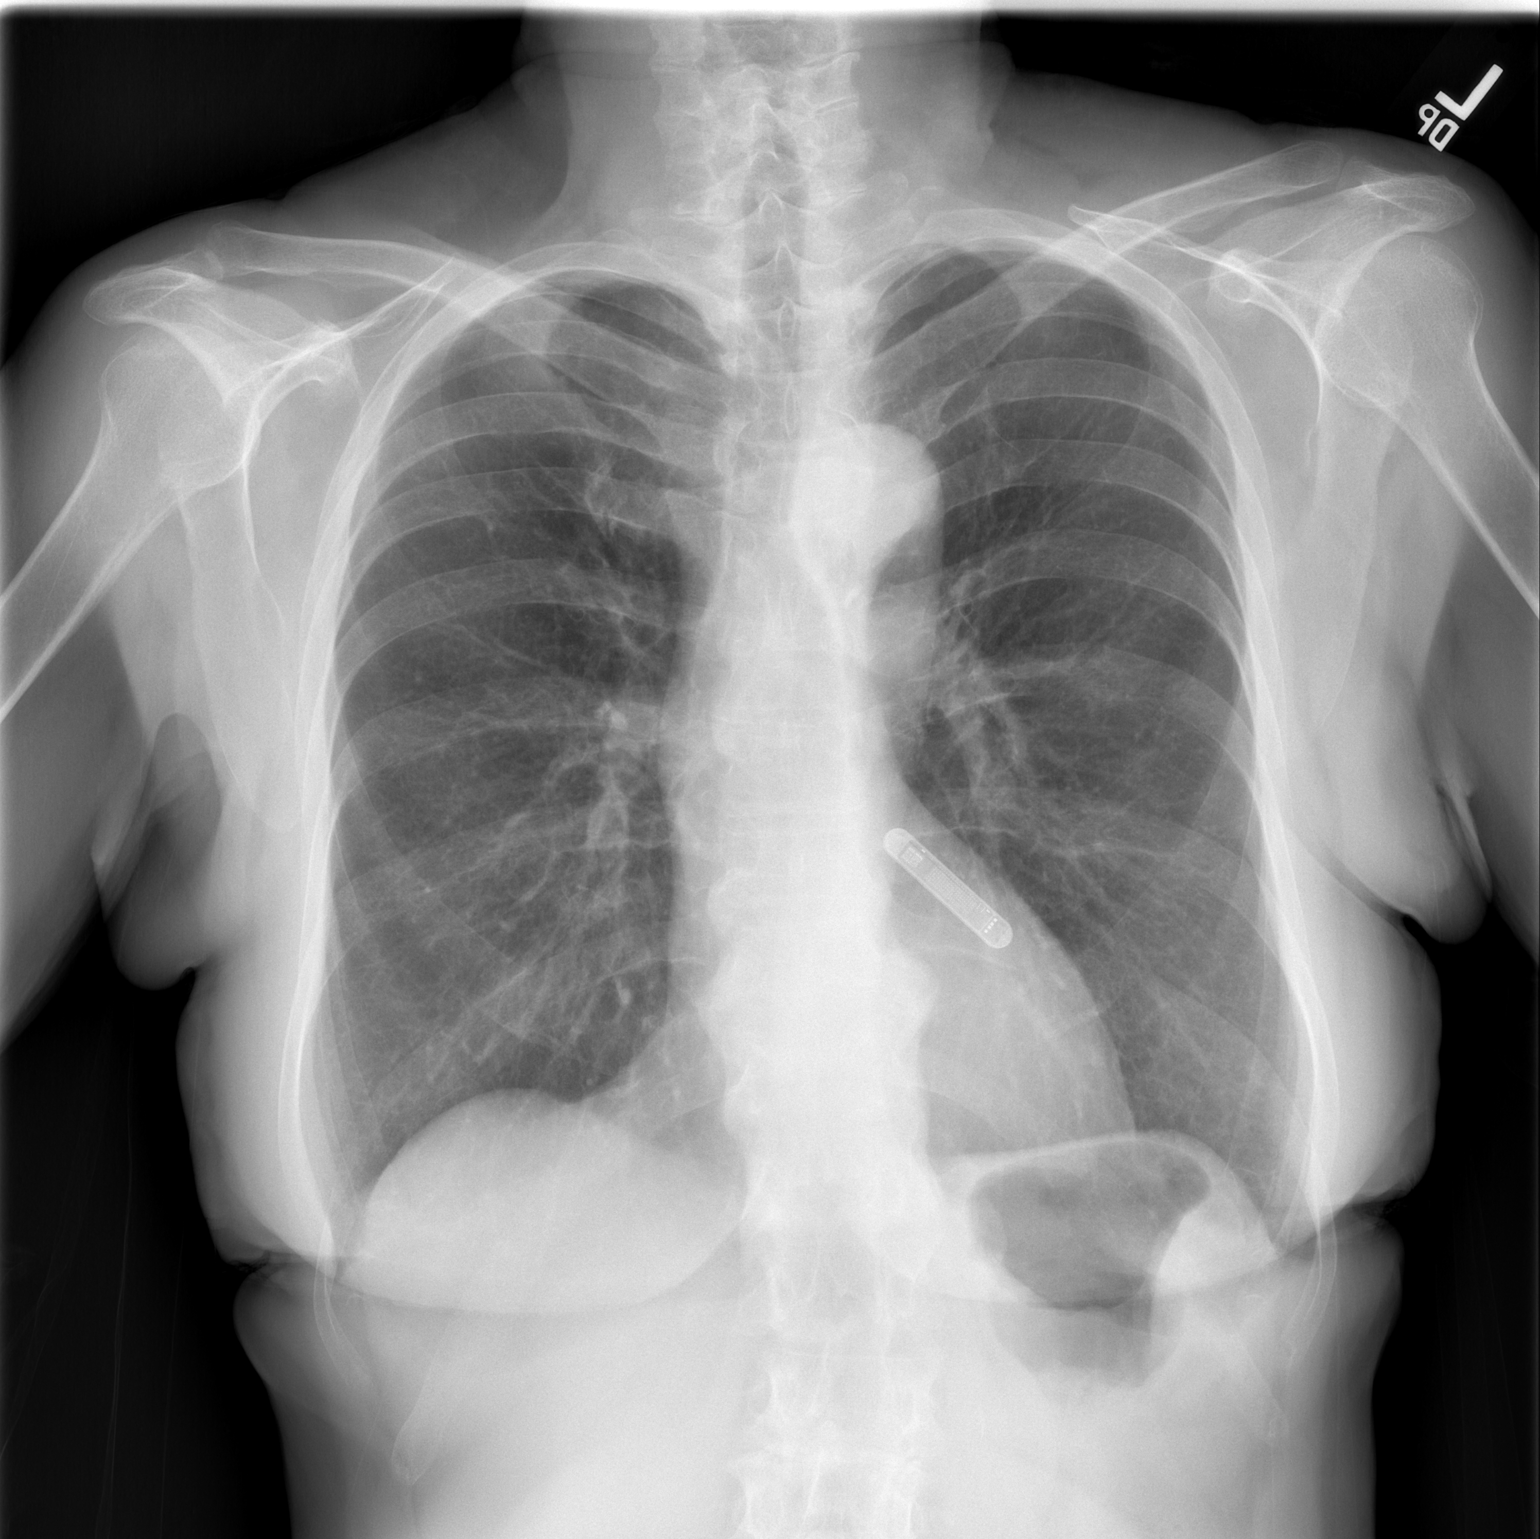

[w chest lat]
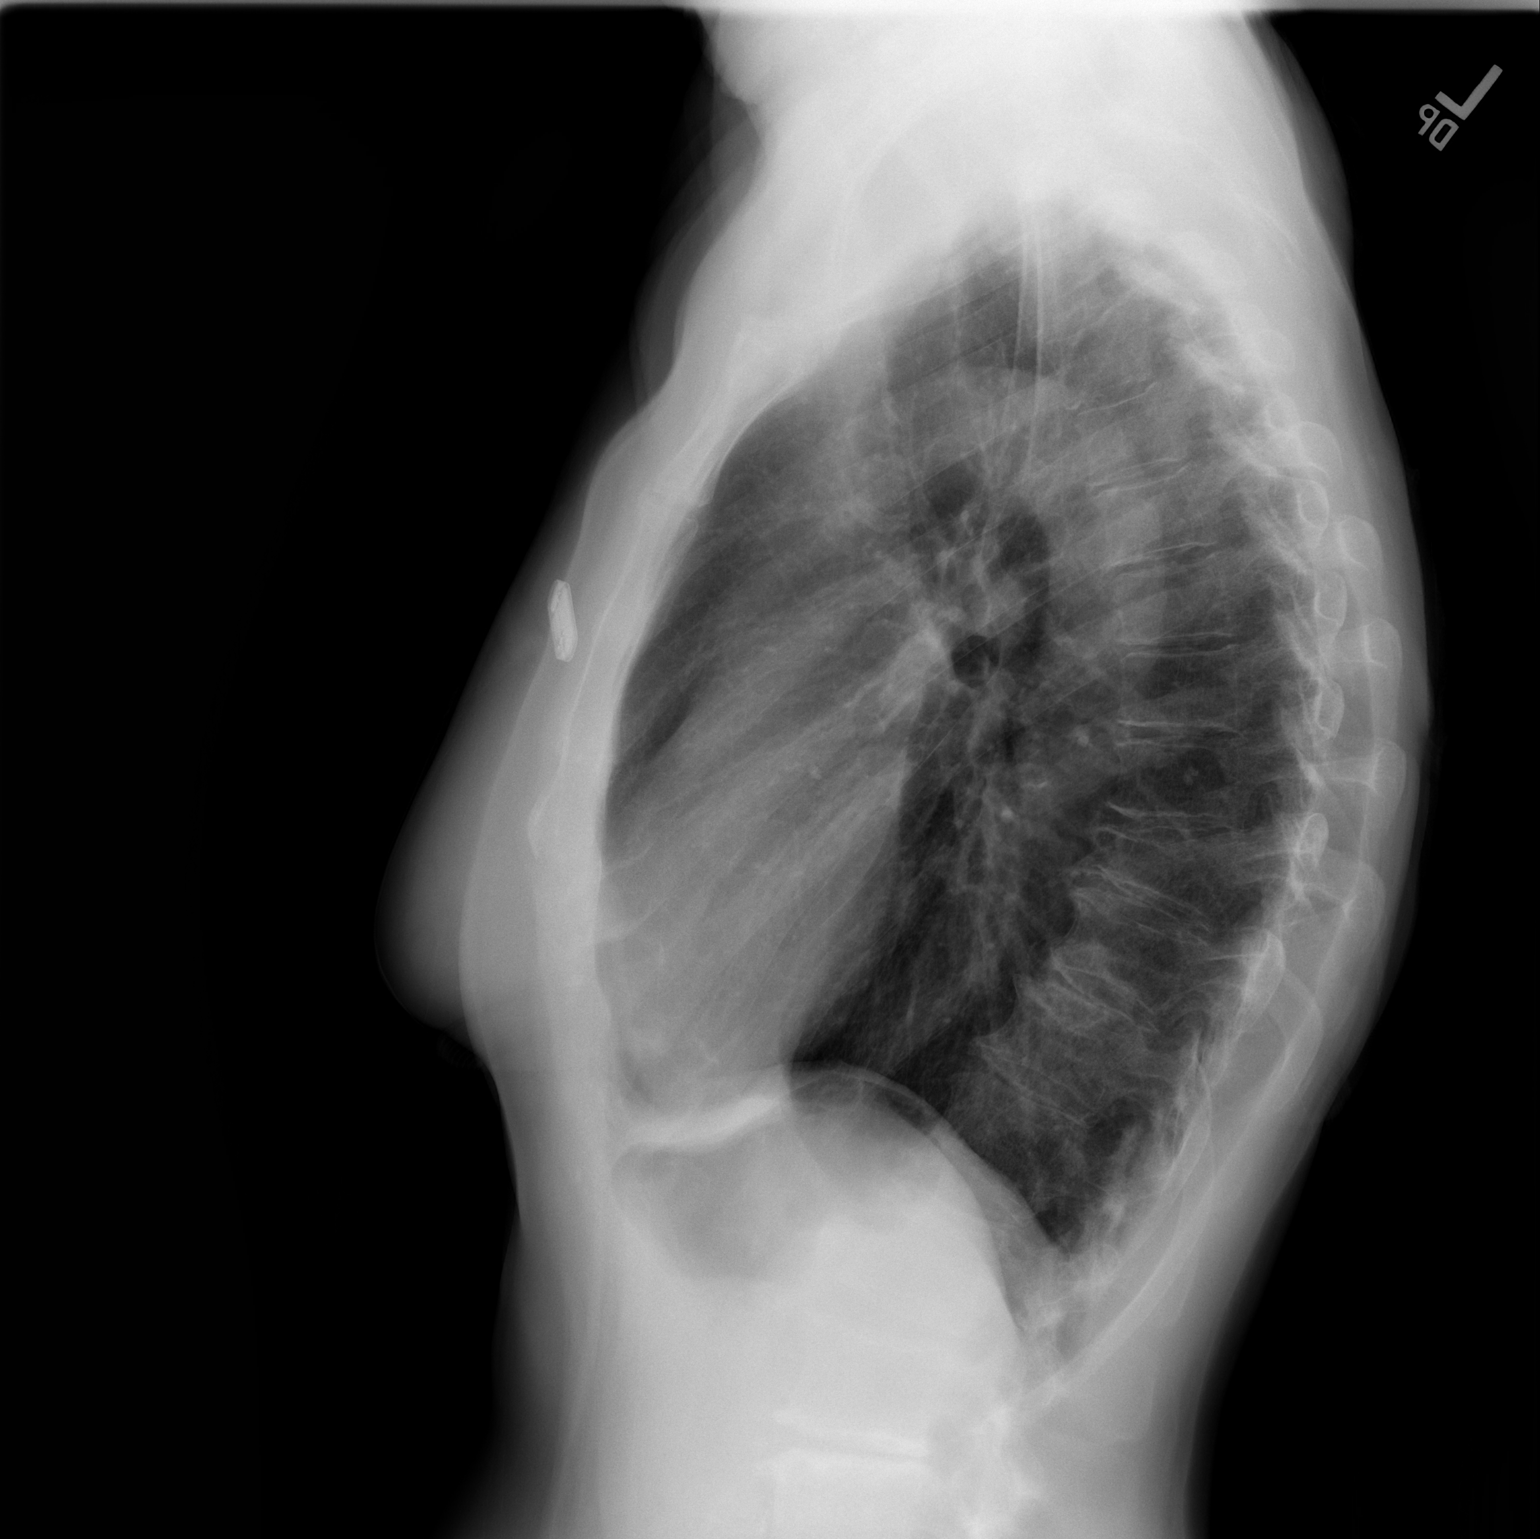

[2 of 2 positions shown; findings below may reference images not displayed]

FINDINGS: Cardiac silhouette normal in size. Thoracic aorta tortuous and
mildly atherosclerotic. Hilar and mediastinal contours otherwise
unremarkable. Mild hyperinflation. Lungs clear. Bronchovascular
markings normal. Pulmonary vascularity normal. No visible pleural
effusions. No pneumothorax. Degenerative changes involving the
thoracic and upper lumbar spine. Compression fracture of T9 which
does not appear acute. Cardiac recording device in the subcutaneous
tissues of the LEFT chest wall.
IMPRESSION: Mild hyperinflation possibly indicating COPD and/or asthma. No acute
cardiopulmonary disease.

## 2019-10-02 LAB — CUP PACEART REMOTE DEVICE CHECK
Date Time Interrogation Session: 20210513110212
Implantable Pulse Generator Implant Date: 20210107

## 2019-10-06 ENCOUNTER — Ambulatory Visit (INDEPENDENT_AMBULATORY_CARE_PROVIDER_SITE_OTHER): Payer: Medicare Other | Admitting: *Deleted

## 2019-10-06 DIAGNOSIS — I639 Cerebral infarction, unspecified: Secondary | ICD-10-CM

## 2019-10-07 NOTE — Progress Notes (Signed)
Carelink Summary Report / Loop Recorder 

## 2019-11-04 ENCOUNTER — Other Ambulatory Visit: Payer: Self-pay

## 2019-11-04 ENCOUNTER — Telehealth: Payer: Self-pay | Admitting: Neurology

## 2019-11-04 MED ORDER — CLOPIDOGREL BISULFATE 75 MG PO TABS: 75.0000 mg | ORAL_TABLET | Freq: Every day | ORAL | 0 refills | Status: DC

## 2019-11-04 NOTE — Telephone Encounter (Signed)
DR Leonie Man pt has not been seen. Prescription was written in February 2021. Does pt need to be on medication. She has not been seen in the office and has appt in August 2021.

## 2019-11-04 NOTE — Telephone Encounter (Signed)
I can refill ongoing if needed

## 2019-11-04 NOTE — Telephone Encounter (Signed)
Kelly from [employer] called needing clarification on the prescription for Plavix 75mg . Prescription was written in Feb. Please advise.

## 2019-11-06 ENCOUNTER — Ambulatory Visit: Payer: Medicare Other | Admitting: Critical Care Medicine

## 2019-11-06 ENCOUNTER — Other Ambulatory Visit: Payer: Self-pay

## 2019-11-06 ENCOUNTER — Encounter: Payer: Self-pay | Admitting: Critical Care Medicine

## 2019-11-06 VITALS — BP 108/68 | HR 72 | Temp 98.4°F | Ht 67.0 in | Wt 139.4 lb

## 2019-11-06 DIAGNOSIS — R0609 Other forms of dyspnea: Secondary | ICD-10-CM

## 2019-11-06 DIAGNOSIS — R0600 Dyspnea, unspecified: Secondary | ICD-10-CM | POA: Diagnosis not present

## 2019-11-06 NOTE — Patient Instructions (Addendum)
Thank you for visiting Dr. Carlis Abbott at [facility]. We recommend the following: Orders Placed This Encounter  Procedures  . Pulmonary function test   Orders Placed This Encounter  Procedures  . Pulmonary function test    Standing Status:   Future    Standing Expiration Date:   11/05/2020    Order Specific Question:   Where should this test be performed?    Answer:   [facility]    Order Specific Question:   Full PFT: includes the following: basic spirometry, spirometry pre & post bronchodilator, diffusion capacity (DLCO), lung volumes    Answer:   Full PFT      Return in about 2 months (around 01/06/2020). after PFTs.    Please do your part to reduce the spread of COVID-19.

## 2019-11-06 NOTE — Progress Notes (Signed)
Synopsis: Referred in June 2021 for abnormal CXR by Lennie Odor, PA.  Subjective:   PATIENT ID: Jacqueline Bruce GENDER: female DOB: 08/18/1950, MRN: [id number]  Chief Complaint  Patient presents with  . Consult    DOE for several years, no cough, stroke in November    Jacqueline Bruce is a 69 year old woman who presents for evaluation of shortness of breath for many years.  This has been stable over this time.  She did not notice any difference after losing weight weight several years ago.  She has maintained a high level of activity, but notices she gets dyspneic when walking up stairs and hills.  Her symptoms are also worse when she is hot.  She denies coughing, wheezing, sputum production.  Her symptoms improve when she stops to rest and bends over to make sure that she is getting good oxygen to her brain.  Drinking water and staying hydrated has also helped.  When she gets shortness of breath episode she feels like she is faint and gets a sensation that blood is draining out of her head.  She has never smoked.  There is no personal or family history of pulmonary disease.  No history of VTE, anemia, kidney disease.  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  Subsequent work-up was negative for shunt or other causes to explain her stroke.  Thankfully she has no persistent deficits.  10/01/2019 PCP note reviewed     Past Medical History:  Diagnosis Date  . Acute CVA (cerebrovascular accident) ([place]) 04/16/2019  . Allergy   . Anxiety 04/15/2019  . Bradycardia with 41-50 beats per minute 04/15/2019  . Factor V Leiden mutation ([place])   . Osteopenia 04/15/2019  . Slurred speech 04/15/2019  . TIA due to embolism ([place]) 04/15/2019     Family History  Problem Relation Age of Onset  . Hypertension Mother   . Factor V Leiden deficiency Mother        no history of DVT or PE  . Hypertension Father   . Heart disease Father   . Atrial fibrillation Father   . Factor V Leiden deficiency Sister         no history of DVT or PE     Past Surgical History:  Procedure Laterality Date  . BUBBLE STUDY  04/25/2019   Procedure: BUBBLE STUDY;  Surgeon: Fay Records, MD;  Location: [place];  Service: Cardiovascular;;  . TEE WITHOUT CARDIOVERSION N/A 04/25/2019   Procedure: TRANSESOPHAGEAL ECHOCARDIOGRAM (TEE);  Surgeon: Fay Records, MD;  Location: [facility];  Service: Cardiovascular;  Laterality: N/A;    Social History   Socioeconomic History  . Marital status: Married    Spouse name: Not on file  . Number of children: Not on file  . Years of education: Not on file  . Highest education level: Not on file  Occupational History  . Not on file  Tobacco Use  . Smoking status: Never Smoker  . Smokeless tobacco: Never Used  Vaping Use  . Vaping Use: Never used  Substance and Sexual Activity  . Alcohol use: No  . Drug use: No  . Sexual activity: Not on file  Other Topics Concern  . Not on file  Social History Narrative  . Not on file   Social Determinants of Health   Financial Resource Strain:   . Difficulty of Paying Living Expenses:   Food Insecurity:   . Worried About Charity fundraiser in the Last  Year:   . Ran Out of Food in the Last Year:   Transportation Needs:   . Film/video editor (Medical):   Marland Kitchen Lack of Transportation (Non-Medical):   Physical Activity:   . Days of Exercise per Week:   . Minutes of Exercise per Session:   Stress:   . Feeling of Stress :   Social Connections:   . Frequency of Communication with Friends and Family:   . Frequency of Social Gatherings with Friends and Family:   . Attends Religious Services:   . Active Member of Clubs or Organizations:   . Attends Archivist Meetings:   Marland Kitchen Marital Status:   Intimate Partner Violence:   . Fear of Current or Ex-Partner:   . Emotionally Abused:   Marland Kitchen Physically Abused:   . Sexually Abused:      No Known Allergies   Immunization History  Administered Date(s)  Administered  . Fluad Quad(high Dose 65+) 02/26/2019    Outpatient Medications Prior to Visit  Medication Sig Dispense Refill  . Ascorbic Acid (VITAMIN C PO) Take 1 tablet by mouth daily.    . calcium carbonate 200 MG capsule Take 250 mg by mouth 2 (two) times daily with a meal.    . cetirizine (ZYRTEC) 5 MG tablet Take 2.5-5 mg by mouth daily.     . clonazePAM (KLONOPIN) 0.5 MG tablet Take 0.25-0.5 mg by mouth at bedtime.     . clopidogrel (PLAVIX) 75 MG tablet Take 1 tablet (75 mg total) by mouth daily. 90 tablet 0  . ferrous fumarate (HEMOCYTE - 106 MG FE) 325 (106 FE) MG TABS tablet Take 1 tablet by mouth.    . ibandronate (BONIVA) 150 MG tablet Take 150 mg by mouth every 30 (thirty) days.    . multivitamin-lutein (OCUVITE-LUTEIN) CAPS capsule Take 1 capsule by mouth daily.    . Omega-3 Fatty Acids (FISH OIL CONCENTRATE PO) Take 1 tablet by mouth daily.     . simvastatin (ZOCOR) 40 MG tablet Take 1 tablet (40 mg total) by mouth every evening. 30 tablet 11  . VITAMIN D, ERGOCALCIFEROL, PO Take 1 tablet by mouth daily.     . Investigational - Study Medication Per study 1 each PRN  . Investigational - Study Medication Per study 1 each PRN  . Investigational - Study Medication Per study 1 each PRN   No facility-administered medications prior to visit.    Review of Systems  Constitutional: Negative for chills and fever.  HENT: Negative for congestion.   Respiratory: Positive for shortness of breath. Negative for cough, sputum production and wheezing.   Cardiovascular: Negative for chest pain and leg swelling.  Gastrointestinal: Negative for blood in stool, heartburn, nausea and vomiting.  Genitourinary: Negative for hematuria.  Musculoskeletal: Negative for joint pain and myalgias.  Skin: Negative for rash.  Neurological: Negative for weakness.  Endo/Heme/Allergies: Negative for environmental allergies.     Objective:   Vitals:   11/06/19 1117  BP: 108/68  Pulse: 72  Temp:  98.4 F (36.9 C)  TempSrc: Oral  Weight: 139 lb 6.4 oz (63.2 kg)  Height: 5\' 7"  (1.702 m)     on   RA BMI Readings from Last 3 Encounters:  11/06/19 21.83 kg/m  07/20/19 23.49 kg/m  05/29/19 24.75 kg/m   Wt Readings from Last 3 Encounters:  11/06/19 139 lb 6.4 oz (63.2 kg)  07/20/19 150 lb (68 kg)  05/29/19 158 lb (71.7 kg)    Physical Exam Vitals reviewed.  Constitutional:      Appearance: Normal appearance.     Comments: Thin, healthy-appearing woman  HENT:     Head: Normocephalic and atraumatic.  Eyes:     General: No scleral icterus. Cardiovascular:     Rate and Rhythm: Normal rate and regular rhythm.     Heart sounds: No murmur heard.   Pulmonary:     Comments: Breathing comfortably on room air, no conversational dyspnea.  Clear to auscultation bilaterally. Abdominal:     General: There is no distension.     Palpations: Abdomen is soft.  Musculoskeletal:        General: No swelling or deformity.     Cervical back: Neck supple.  Lymphadenopathy:     Cervical: No cervical adenopathy.  Skin:    General: Skin is warm and dry.     Findings: No rash.  Neurological:     General: No focal deficit present.     Mental Status: She is alert.     Coordination: Coordination normal.  Psychiatric:        Mood and Affect: Mood normal.        Behavior: Behavior normal.      CBC    Component Value Date/Time   WBC 6.7 04/15/2019 1240   RBC 4.62 04/15/2019 1240   HGB 14.6 04/15/2019 1252   HCT 43.0 04/15/2019 1252   PLT 258 04/15/2019 1240   MCV 92.9 04/15/2019 1240   MCH 30.3 04/15/2019 1240   MCHC 32.6 04/15/2019 1240   RDW 13.2 04/15/2019 1240   LYMPHSABS 2.1 04/15/2019 1240   MONOABS 0.5 04/15/2019 1240   EOSABS 0.1 04/15/2019 1240   BASOSABS 0.0 04/15/2019 1240    CHEMISTRY No results for input(s): NA, K, CL, CO2, GLUCOSE, BUN, CREATININE, CALCIUM, MG, PHOS in the last 168 hours. CrCl cannot be calculated (Patient's most recent lab result is older  than the maximum 21 days allowed.).   Chest Imaging- films reviewed: CXR, 2 view 10/01/2019-mild scoliosis, thoracic compression fracture with loss of disc height.  No masses or opacities.  External monitoring device overlying the mid chest.  CTA neck 04/15/2019, lung images reviewed-normal lung parenchyma  Pulmonary Functions Testing Results: No flowsheet data found.   Echocardiogram 04/15/2019: LVEF 60 to 65% with normal LV motion.  Normal LA, RV, RA.  Normal valves. Follow-up TEE 04/25/2019 without shunts     Assessment & Plan:     ICD-10-CM   1. DOE (dyspnea on exertion)  R06.00 Pulmonary function test    Chronic dyspnea on exertion-persistent but stable over many years -PFTs -Reviewed chest x-ray during the visit.  I think the perception of hyperinflation is likely artifactual due to low overall soft tissue mass.  No evidence of hemidiaphragm flattening to suggest hyperinflation. -Continue regular physical activity to maintain exercise tolerance   RTC in 2 months after PFTs.   Current Outpatient Medications:  .  Ascorbic Acid (VITAMIN C PO), Take 1 tablet by mouth daily., Disp: , Rfl:  .  calcium carbonate 200 MG capsule, Take 250 mg by mouth 2 (two) times daily with a meal., Disp: , Rfl:  .  cetirizine (ZYRTEC) 5 MG tablet, Take 2.5-5 mg by mouth daily. , Disp: , Rfl:  .  clonazePAM (KLONOPIN) 0.5 MG tablet, Take 0.25-0.5 mg by mouth at bedtime. , Disp: , Rfl:  .  clopidogrel (PLAVIX) 75 MG tablet, Take 1 tablet (75 mg total) by mouth daily., Disp: 90 tablet, Rfl: 0 .  ferrous fumarate (HEMOCYTE -  106 MG FE) 325 (106 FE) MG TABS tablet, Take 1 tablet by mouth., Disp: , Rfl:  .  ibandronate (BONIVA) 150 MG tablet, Take 150 mg by mouth every 30 (thirty) days., Disp: , Rfl:  .  multivitamin-lutein (OCUVITE-LUTEIN) CAPS capsule, Take 1 capsule by mouth daily., Disp: , Rfl:  .  Omega-3 Fatty Acids (FISH OIL CONCENTRATE PO), Take 1 tablet by mouth daily. , Disp: , Rfl:  .   simvastatin (ZOCOR) 40 MG tablet, Take 1 tablet (40 mg total) by mouth every evening., Disp: 30 tablet, Rfl: 11 .  VITAMIN D, ERGOCALCIFEROL, PO, Take 1 tablet by mouth daily. , Disp: , Rfl:  .  Investigational - Study Medication, Per study, Disp: 1 each, Rfl: PRN .  Investigational - Study Medication, Per study, Disp: 1 each, Rfl: PRN .  Investigational - Study Medication, Per study, Disp: 1 each, Rfl: PRN    Julian Hy, DO [facility] 11/06/2019 11:56 AM

## 2019-11-10 ENCOUNTER — Ambulatory Visit (INDEPENDENT_AMBULATORY_CARE_PROVIDER_SITE_OTHER): Payer: Medicare Other | Admitting: *Deleted

## 2019-11-10 ENCOUNTER — Telehealth: Payer: Self-pay | Admitting: Internal Medicine

## 2019-11-10 DIAGNOSIS — I639 Cerebral infarction, unspecified: Secondary | ICD-10-CM | POA: Diagnosis not present

## 2019-11-10 LAB — CUP PACEART REMOTE DEVICE CHECK
Date Time Interrogation Session: 20210620230443
Implantable Pulse Generator Implant Date: 20210107

## 2019-11-10 NOTE — Telephone Encounter (Signed)
Patient is calling in regards to clopidogrel (PLAVIX) 75 MG tablet medication. She states she would like to know why she needs to contact her neurologist for future refills. Please call to discuss.

## 2019-11-10 NOTE — Telephone Encounter (Signed)
Pt is calling to ask should her refills for Plavix come from Neuro or Cardiology.  Pt is followed by Dr. Lovena Le her for recent ILR that was placed, as requested by Neuro, for recent stroke.  Pt is on Plavix 75 mg po daily for stroke, and this has been followed by Neurology.  Per Dr. Leonie Man pts Neurologist, he okayed to continue refilling her Plavix from here on out.  Informed the pt that both specialities will always do what is in her best interest, as far as making sure she always has refills of this medication, but the reason she is on this, is from a Neurological need.  Pt verbalized understanding and agrees with this plan. Pt appreciative for all the assistance provided, and explaining this to her.

## 2019-11-11 NOTE — Progress Notes (Signed)
Carelink Summary Report / Loop Recorder 

## 2019-12-15 ENCOUNTER — Ambulatory Visit: Payer: Medicare Other

## 2019-12-16 LAB — CUP PACEART REMOTE DEVICE CHECK
Date Time Interrogation Session: 20210725230522
Implantable Pulse Generator Implant Date: 20210107

## 2020-01-01 ENCOUNTER — Telehealth: Payer: Self-pay | Admitting: Neurology

## 2020-01-01 NOTE — Telephone Encounter (Signed)
Pt has called to report she is on vacation sine 08-03 and until 08-25 pt states on her back, arms and legs there are dark black & purplish colored bruises.  Pt states she has not been bumping into anything and where she is is fairly remote and clean.  Pt states she is unsure if this has to do with her version of Plavix or not.  Pt would like to know if Dr Leonie Man has any feedback or recommendations as to what to do on this matter.

## 2020-01-01 NOTE — Telephone Encounter (Signed)
Kindly inform the patient that she may stop the Plavix for 3 to 5 days to see if these improve.  If they improve she could stop Plavix and change to aspirin 81 mg daily.  If not she may have to see a primary care physician for further work-up

## 2020-01-01 NOTE — Telephone Encounter (Signed)
Left detailed VM with Dr Clydene Fake recommendations and # for call back if needed.

## 2020-01-07 ENCOUNTER — Encounter: Payer: Self-pay | Admitting: *Deleted

## 2020-01-07 NOTE — Telephone Encounter (Signed)
Patient called back to discuss voice mail in detail. She stated the bruises have improved; she will start ASA 81 mg daily today. She then asked if Dr Leonie Man saw her photos sent by my chart. I advised her I do not see a my chart with photos attached, advised she can show Dr Leonie Man when she sees him on 01/27/20. Patient verbalized understanding, appreciation.

## 2020-01-09 ENCOUNTER — Encounter: Payer: Self-pay | Admitting: Internal Medicine

## 2020-01-09 ENCOUNTER — Ambulatory Visit: Payer: Medicare Other | Admitting: Internal Medicine

## 2020-01-09 ENCOUNTER — Other Ambulatory Visit: Payer: Self-pay

## 2020-01-09 VITALS — BP 114/66 | HR 66 | Ht 67.0 in | Wt 136.0 lb

## 2020-01-09 DIAGNOSIS — I639 Cerebral infarction, unspecified: Secondary | ICD-10-CM | POA: Diagnosis not present

## 2020-01-09 LAB — CBC WITH DIFFERENTIAL/PLATELET
Basophils Absolute: 0.1 10*3/uL (ref 0.0–0.2)
Basos: 1 %
EOS (ABSOLUTE): 0.1 10*3/uL (ref 0.0–0.4)
Eos: 1 %
Hematocrit: 42.5 % (ref 34.0–46.6)
Hemoglobin: 14.2 g/dL (ref 11.1–15.9)
Immature Grans (Abs): 0 10*3/uL (ref 0.0–0.1)
Immature Granulocytes: 0 %
Lymphocytes Absolute: 2 10*3/uL (ref 0.7–3.1)
Lymphs: 26 %
MCH: 29.7 pg (ref 26.6–33.0)
MCHC: 33.4 g/dL (ref 31.5–35.7)
MCV: 89 fL (ref 79–97)
Monocytes Absolute: 0.6 10*3/uL (ref 0.1–0.9)
Monocytes: 7 %
Neutrophils Absolute: 5.1 10*3/uL (ref 1.4–7.0)
Neutrophils: 65 %
Platelets: 263 10*3/uL (ref 150–450)
RBC: 4.78 x10E6/uL (ref 3.77–5.28)
RDW: 12.7 % (ref 11.7–15.4)
WBC: 7.9 10*3/uL (ref 3.4–10.8)

## 2020-01-09 NOTE — Progress Notes (Signed)
HPI Jacqueline Bruce returns today for ongoing followup. She has a h/o cryptogenic stroke, s/p ILR insertion several months ago. In the interim she notes easy bruisability. No palpitations. No syncope.  No Known Allergies   Current Outpatient Medications  Medication Sig Dispense Refill  . Ascorbic Acid (VITAMIN C PO) Take 1 tablet by mouth daily.    Marland Kitchen aspirin EC 81 MG tablet Take 81 mg by mouth daily. Swallow whole.    . calcium carbonate 200 MG capsule Take 250 mg by mouth 2 (two) times daily with a meal.    . cetirizine (ZYRTEC) 5 MG tablet Take 2.5-5 mg by mouth daily.     . clonazePAM (KLONOPIN) 0.5 MG tablet Take 0.25-0.5 mg by mouth at bedtime.     . ferrous fumarate (HEMOCYTE - 106 MG FE) 325 (106 FE) MG TABS tablet Take 1 tablet by mouth.    . ibandronate (BONIVA) 150 MG tablet Take 150 mg by mouth every 30 (thirty) days.    . multivitamin-lutein (OCUVITE-LUTEIN) CAPS capsule Take 1 capsule by mouth daily.    . Omega-3 Fatty Acids (FISH OIL CONCENTRATE PO) Take 1 tablet by mouth daily.     . simvastatin (ZOCOR) 40 MG tablet Take 1 tablet (40 mg total) by mouth every evening. 30 tablet 11  . VITAMIN D, ERGOCALCIFEROL, PO Take 1 tablet by mouth daily.      No current facility-administered medications for this visit.     Past Medical History:  Diagnosis Date  . Acute CVA (cerebrovascular accident) ([place]) 04/16/2019  . Allergy   . Anxiety 04/15/2019  . Bradycardia with 41-50 beats per minute 04/15/2019  . Factor V Leiden mutation ([place])   . Osteopenia 04/15/2019  . Slurred speech 04/15/2019  . TIA due to embolism ([place]) 04/15/2019    ROS:   All systems reviewed and negative except as noted in the HPI.   Past Surgical History:  Procedure Laterality Date  . BUBBLE STUDY  04/25/2019   Procedure: BUBBLE STUDY;  Surgeon: Fay Records, MD;  Location: [place];  Service: Cardiovascular;;  . TEE WITHOUT CARDIOVERSION N/A 04/25/2019   Procedure: TRANSESOPHAGEAL  ECHOCARDIOGRAM (TEE);  Surgeon: Fay Records, MD;  Location: [facility] ENDOSCOPY;  Service: Cardiovascular;  Laterality: N/A;     Family History  Problem Relation Age of Onset  . Hypertension Mother   . Factor V Leiden deficiency Mother        no history of DVT or PE  . Hypertension Father   . Heart disease Father   . Atrial fibrillation Father   . Factor V Leiden deficiency Sister        no history of DVT or PE     Social History   Socioeconomic History  . Marital status: Married    Spouse name: Not on file  . Number of children: Not on file  . Years of education: Not on file  . Highest education level: Not on file  Occupational History  . Not on file  Tobacco Use  . Smoking status: Never Smoker  . Smokeless tobacco: Never Used  Vaping Use  . Vaping Use: Never used  Substance and Sexual Activity  . Alcohol use: No  . Drug use: No  . Sexual activity: Not on file  Other Topics Concern  . Not on file  Social History Narrative  . Not on file   Social Determinants of Health   Financial Resource Strain:   . Difficulty of  Paying Living Expenses: Not on file  Food Insecurity:   . Worried About Charity fundraiser in the Last Year: Not on file  . Ran Out of Food in the Last Year: Not on file  Transportation Needs:   . Lack of Transportation (Medical): Not on file  . Lack of Transportation (Non-Medical): Not on file  Physical Activity:   . Days of Exercise per Week: Not on file  . Minutes of Exercise per Session: Not on file  Stress:   . Feeling of Stress : Not on file  Social Connections:   . Frequency of Communication with Friends and Family: Not on file  . Frequency of Social Gatherings with Friends and Family: Not on file  . Attends Religious Services: Not on file  . Active Member of Clubs or Organizations: Not on file  . Attends Archivist Meetings: Not on file  . Marital Status: Not on file  Intimate Partner Violence:   . Fear of Current or Ex-Partner:  Not on file  . Emotionally Abused: Not on file  . Physically Abused: Not on file  . Sexually Abused: Not on file     BP 114/66   Pulse 66   Ht 5\' 7"  (1.702 m)   Wt 136 lb (61.7 kg)   SpO2 96%   BMI 21.30 kg/m   Physical Exam:  Well appearing NAD HEENT: Unremarkable Neck:  6 cm JVD, no thyromegally Lymphatics:  No adenopathy Back:  No CVA tenderness Lungs:  Clear with no wheezes HEART:  Regular rate rhythm, no murmurs, no rubs, no clicks Abd:  soft, positive bowel sounds, no organomegally, no rebound, no guarding Ext:  2 plus pulses, no edema, no cyanosis, no clubbing Skin:  No rashes but multiple areas of healing ecchymosis, no nodules Neuro:  CN II through XII intact, motor grossly intact   DEVICE  Normal device function.  See PaceArt for details.   Assess/Plan: 1. Cryptogenic stroke - she is maintaining NSR. Ne explanation for an [place]. 2. Easy bruisability - we will have her check a CBC to make sure her platelets are ok.  Salome Spotted.

## 2020-01-09 NOTE — Patient Instructions (Addendum)
Medication Instructions:  Your physician recommends that you continue on your current medications as directed. Please refer to the Current Medication list given to you today.  Labwork: You will get lab work today:  CBC  Testing/Procedures: None ordered.  Follow-Up: Your physician wants you to follow-up in: one year with Dr. Lovena Le.   You will receive a reminder letter in the mail two months in advance. If you don't receive a letter, please call our office to schedule the follow-up appointment.  Remote monthly monitoring is used to monitor your loop recorder  Any Other Special Instructions Will Be Listed Below (If Applicable).  If you need a refill on your cardiac medications before your next appointment, please call your pharmacy.

## 2020-01-13 ENCOUNTER — Ambulatory Visit (INDEPENDENT_AMBULATORY_CARE_PROVIDER_SITE_OTHER): Payer: Medicare Other | Admitting: Critical Care Medicine

## 2020-01-13 ENCOUNTER — Other Ambulatory Visit: Payer: Self-pay

## 2020-01-13 ENCOUNTER — Encounter: Payer: Self-pay | Admitting: Primary Care

## 2020-01-13 ENCOUNTER — Ambulatory Visit: Payer: Medicare Other | Admitting: Primary Care

## 2020-01-13 DIAGNOSIS — R0600 Dyspnea, unspecified: Secondary | ICD-10-CM

## 2020-01-13 DIAGNOSIS — R0609 Other forms of dyspnea: Secondary | ICD-10-CM

## 2020-01-13 LAB — PULMONARY FUNCTION TEST
DL/VA % pred: 120 %
DL/VA: 4.9 ml/min/mmHg/L
DLCO cor % pred: 123 %
DLCO cor: 26.85 ml/min/mmHg
DLCO unc % pred: 126 %
DLCO unc: 27.49 ml/min/mmHg
FEF 25-75 Post: 2.58 L/sec
FEF 25-75 Pre: 2.3 L/sec
FEF2575-%Change-Post: 12 %
FEF2575-%Pred-Post: 118 %
FEF2575-%Pred-Pre: 105 %
FEV1-%Change-Post: 3 %
FEV1-%Pred-Post: 104 %
FEV1-%Pred-Pre: 101 %
FEV1-Post: 2.74 L
FEV1-Pre: 2.66 L
FEV1FVC-%Change-Post: 6 %
FEV1FVC-%Pred-Pre: 101 %
FEV6-%Change-Post: -2 %
FEV6-%Pred-Post: 100 %
FEV6-%Pred-Pre: 103 %
FEV6-Post: 3.31 L
FEV6-Pre: 3.41 L
FEV6FVC-%Change-Post: 0 %
FEV6FVC-%Pred-Post: 104 %
FEV6FVC-%Pred-Pre: 103 %
FVC-%Change-Post: -3 %
FVC-%Pred-Post: 96 %
FVC-%Pred-Pre: 99 %
FVC-Post: 3.31 L
FVC-Pre: 3.43 L
Post FEV1/FVC ratio: 83 %
Post FEV6/FVC ratio: 100 %
Pre FEV1/FVC ratio: 78 %
Pre FEV6/FVC Ratio: 99 %
RV % pred: 90 %
RV: 2.09 L
TLC % pred: 103 %
TLC: 5.69 L

## 2020-01-13 NOTE — Progress Notes (Signed)
@Patient  ID: Dory Larsen, female    DOB: 10-19-1950, 69 y.o.   MRN: [id number]  Chief Complaint  Patient presents with  . Follow-up    PFT results    Referring provider: Lennie Odor, [place]  Synopsis: Referred in June 2021 for abnormal CXR by Lennie Odor, [place].  HPI: 69 year old female, never smoked.  Past medical history significant for a CVA, osteopenia, anxiety, bradycardia.  Patient of Dr. Carlis Abbott, seen for initial consult on 11/06/2019 for dyspnea on exertion.  Previous [facility] pulmonary encounters 11/06/2019-Dr. Carlis Abbott, consult Mrs. Billiot is a 69 year old woman who presents for evaluation of shortness of breath for many years.  This has been stable over this time.  She did not notice any difference after losing weight weight several years ago.  She has maintained a high level of activity, but notices she gets dyspneic when walking up stairs and hills.  Her symptoms are also worse when she is hot.  She denies coughing, wheezing, sputum production.  Her symptoms improve when she stops to rest and bends over to make sure that she is getting good oxygen to her brain.  Drinking water and staying hydrated has also helped.  When she gets shortness of breath episode she feels like she is faint and gets a sensation that blood is draining out of her head.  She has never smoked.  There is no personal or family history of pulmonary disease.  No history of VTE, anemia, kidney disease.  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  Subsequent work-up was negative for shunt or other causes to explain her stroke.  Thankfully she has no persistent deficits.  10/01/2019 PCP note reviewed  01/13/2020-interim history Patient presents today for 2 to 32-month follow-up.  During last visit with Dr. Carlis Abbott chest x-ray was reviewed, hyperinflation likely artifactual due to low overall soft tissue mass.  No evidence of hemidiaphragm flattening to suggest hyperinflation.  Encourage patient to continue with regular  physical physical exercise to maintain exercise tolerance.  She was ordered for pulmonary function testing.  Hx stroke. She reports shortness of breath going up 12 stairs. This is not new and has been going on for several years. Occasional chest tightness. No cough. She gets regular exercise. She needs to bend over toe catch her breath. Her symptoms are worse in heat and humidity. Reports lightheadedness, follows with cardiology and was seen last week  She is wearing monitor to monitor for afib - cup paceart remove device check was ok. Placed in February 2021.  Echocardiogram in November 2020 showed EF 60-65%. Normal LV function. Denies trouble swallowing .    Chest Imaging- films reviewed: CXR, 2 view 10/01/2019-mild scoliosis, thoracic compression fracture with loss of disc height.  No masses or opacities.  External monitoring device overlying the mid chest.  CTA neck 04/15/2019, lung images reviewed-normal lung parenchyma  Pulmonary Functions Testing Results: 01/13/2020 - FVC 3.31 (96%), FEV1 2.74 (104%), ratio 83, TLC 103%, DLCOcor 26.85 (123%) Interpretation: No evidence of restriction or obstruction. No BD response. Increase diffusion capacity.    Echocardiogram 04/15/2019: LVEF 60 to 65% with normal LV motion.  Normal LA, RV, RA.  Normal valves. Follow-up TEE 04/25/2019 without shunts   No Known Allergies  Immunization History  Administered Date(s) Administered  . Fluad Quad(high Dose 65+) 02/26/2019  . PFIZER SARS-COV-2 Vaccination 06/28/2019, 07/19/2019    Past Medical History:  Diagnosis Date  . Acute CVA (cerebrovascular accident) ([place]) 04/16/2019  . Allergy   . Anxiety  04/15/2019  . Bradycardia with 41-50 beats per minute 04/15/2019  . Factor V Leiden mutation ([place])   . Osteopenia 04/15/2019  . Slurred speech 04/15/2019  . TIA due to embolism ([place]) 04/15/2019    Tobacco History: Social History   Tobacco Use  Smoking Status Never Smoker  Smokeless Tobacco  Never Used   Counseling given: Not Answered   Outpatient Medications Prior to Visit  Medication Sig Dispense Refill  . Ascorbic Acid (VITAMIN C PO) Take 1 tablet by mouth daily.    Marland Kitchen aspirin EC 81 MG tablet Take 81 mg by mouth daily. Swallow whole.    . calcium carbonate 200 MG capsule Take 250 mg by mouth 2 (two) times daily with a meal.    . cetirizine (ZYRTEC) 5 MG tablet Take 2.5-5 mg by mouth daily.     . clonazePAM (KLONOPIN) 0.5 MG tablet Take 0.25-0.5 mg by mouth at bedtime.     . ferrous fumarate (HEMOCYTE - 106 MG FE) 325 (106 FE) MG TABS tablet Take 1 tablet by mouth.    . ibandronate (BONIVA) 150 MG tablet Take 150 mg by mouth every 30 (thirty) days.    . multivitamin-lutein (OCUVITE-LUTEIN) CAPS capsule Take 1 capsule by mouth daily.    . Omega-3 Fatty Acids (FISH OIL CONCENTRATE PO) Take 1 tablet by mouth daily.     . simvastatin (ZOCOR) 40 MG tablet Take 1 tablet (40 mg total) by mouth every evening. 30 tablet 11  . VITAMIN D, ERGOCALCIFEROL, PO Take 1 tablet by mouth daily.      No facility-administered medications prior to visit.   Review of Systems  Review of Systems  Constitutional: Negative.   Respiratory: Positive for chest tightness. Negative for cough and wheezing.        DOE  Cardiovascular: Negative.    Physical Exam  BP (!) 160/70 (BP Location: Left Arm, Cuff Size: Normal)   Pulse (!) 49   Temp 97.8 F (36.6 C) (Temporal)   Ht 5\' 7"  (1.702 m)   Wt 137 lb (62.1 kg)   SpO2 100%   BMI 21.46 kg/m  Physical Exam Constitutional:      Appearance: Normal appearance.     Comments: Tall/slender female  HENT:     Head: Normocephalic and atraumatic.  Cardiovascular:     Rate and Rhythm: Regular rhythm. Bradycardia present.  Pulmonary:     Effort: Pulmonary effort is normal. No respiratory distress.     Breath sounds: Normal breath sounds. No stridor. No wheezing, rhonchi or rales.  Musculoskeletal:        General: Normal range of motion.  Skin:     General: Skin is warm and dry.  Neurological:     General: No focal deficit present.     Mental Status: She is alert and oriented to person, place, and time. Mental status is at baseline.  Psychiatric:        Mood and Affect: Mood normal.        Behavior: Behavior normal.        Thought Content: Thought content normal.        Judgment: Judgment normal.      Lab Results:  CBC    Component Value Date/Time   WBC 7.9 01/09/2020 1529   WBC 6.7 04/15/2019 1240   RBC 4.78 01/09/2020 1529   RBC 4.62 04/15/2019 1240   HGB 14.2 01/09/2020 1529   HCT 42.5 01/09/2020 1529   PLT 263 01/09/2020 1529   MCV 89  01/09/2020 1529   MCH 29.7 01/09/2020 1529   MCH 30.3 04/15/2019 1240   MCHC 33.4 01/09/2020 1529   MCHC 32.6 04/15/2019 1240   RDW 12.7 01/09/2020 1529   LYMPHSABS 2.0 01/09/2020 1529   MONOABS 0.5 04/15/2019 1240   EOSABS 0.1 01/09/2020 1529   BASOSABS 0.1 01/09/2020 1529    BMET    Component Value Date/Time   NA 136 04/15/2019 1252   K 4.5 04/15/2019 1252   CL 109 04/15/2019 1252   CO2 25 04/15/2019 1240   GLUCOSE 96 04/15/2019 1252   BUN 29 (H) 04/15/2019 1252   CREATININE 1.00 04/15/2019 1252   CALCIUM 10.5 (H) 04/15/2019 1240   GFRNONAA 51 (L) 04/15/2019 1240   GFRAA 59 (L) 04/15/2019 1240    BNP No results found for: BNP  ProBNP No results found for: PROBNP  Imaging: CUP PACEART REMOTE DEVICE CHECK  Result Date: 12/16/2019 [facility] summary report received. Battery status OK. Normal device function. No new symptom episodes, tachy episodes, brady, or pause episodes. No new AF episodes. Monthly summary reports and ROV/PRN    Assessment & Plan:   Dyspnea - Pulmonary function testing today showed no evidence of restriction or obstruction. Do not suspect underlying asthma or COPD. Elevated diffusion capacity is not clinically significant for underlying pulmonary condition. May be related to cardiac.  - Recommendations: Use incentive spirometer 10 breaths x  three times a day (encourage deep breathing exercises) - Follow-up: Continue to follow with cardiology; 6 months with Dr. Carlis Abbott APP or sooner if needed    Martyn Ehrich, NP 01/13/2020

## 2020-01-13 NOTE — Assessment & Plan Note (Signed)
-   Pulmonary function testing today showed no evidence of restriction or obstruction. Do not suspect underlying asthma or COPD. Elevated diffusion capacity is not clinically significant for underlying pulmonary condition. May be related to cardiac.  - Recommendations: Use incentive spirometer 10 breaths x three times a day (encourage deep breathing exercises) - Follow-up: Continue to follow with cardiology; 6 months with Dr. Carlis Abbott APP or sooner if needed

## 2020-01-13 NOTE — Progress Notes (Signed)
Full PFT performed today. °

## 2020-01-13 NOTE — Patient Instructions (Addendum)
Nice seeing you today Jacqueline Bruce  Pulmonary function testing today showed no evidence of restriction or obstruction. Do not suspect underlying asthma or COPD. Elevated diffusion capacity is not clinically significant for underlying pulmonary condition. May be related to cardiac.   Recommendations:  - Use incentive spirometer 10 breaths x three times a day (encourage deep breathing exercises)  Follow-up: - Continue to follow with cardiology  - 6 months with Dr. Carlis Abbott or sooner if needed

## 2020-01-14 ENCOUNTER — Ambulatory Visit: Payer: Medicare Other | Admitting: Neurology

## 2020-01-19 ENCOUNTER — Ambulatory Visit (INDEPENDENT_AMBULATORY_CARE_PROVIDER_SITE_OTHER): Payer: Medicare Other | Admitting: *Deleted

## 2020-01-19 DIAGNOSIS — I639 Cerebral infarction, unspecified: Secondary | ICD-10-CM

## 2020-01-19 LAB — CUP PACEART REMOTE DEVICE CHECK
Date Time Interrogation Session: 20210827230553
Implantable Pulse Generator Implant Date: 20210107

## 2020-01-21 NOTE — Progress Notes (Signed)
Carelink Summary Report / Loop Recorder 

## 2020-01-27 ENCOUNTER — Other Ambulatory Visit: Payer: Self-pay

## 2020-01-27 ENCOUNTER — Ambulatory Visit: Payer: Medicare Other | Admitting: Neurology

## 2020-01-27 ENCOUNTER — Encounter: Payer: Self-pay | Admitting: Neurology

## 2020-01-27 VITALS — BP 135/87 | HR 54 | Ht 67.0 in | Wt 138.0 lb

## 2020-01-27 DIAGNOSIS — I639 Cerebral infarction, unspecified: Secondary | ICD-10-CM

## 2020-01-27 NOTE — Progress Notes (Signed)
[facility] [address] [place]. [place] [address] [phone]       OFFICE FOLLOW-UP NOTE  [name] Date of Birth:  [birth year] Medical Record Number:  [id number]   HPI: Jacqueline Bruce is a pleasant 69 year old Caucasian lady seen today for initial office follow-up visit following stroke in November 2020.  History is obtained from her, review of electronic medical records as well as research study notes and personal review of imaging films in PACS.  She is a pleasant 69 year old lady with no prior past medical history except allergies.  She presented on 04/15/2019 with sudden onset of difficulty expressing herself while at [place].  EMS were called during transport her symptoms rapidly improved.  While in the ER she also had transient 1 or 2 episodes of word finding difficulties.  CT scan of the head was obtained which was unremarkable but subsequently MRI scan of the brain showed tiny punctate acute left parietal cortical infarct.  CT angiogram of the head and neck showed only mild atherosclerosis involving left carotid siphon with a 1 to 2 mm outpouching in the cavernous carotid possibly in front balloon.  There was calcified aortic sclerosis noted.  2D echo showed normal ejection fraction TEE and subsequently as an outpatient showed no cardiac source of embolism.  Patient had loop recorder inserted and so for paroxysmal A. fib has not yet been found.  LDL cholesterol 118 mg percent and hemoglobin A1c was 5.5.  Patient participated in the  [employer] axiomatic stroke prevention trial ( Factor 11 a inhibitor versus placebo) and completed participation.  She was placed on Plavix after that but call the office complaining of significant bruising.  After Plavix was stopped losing improved and we have switched her back to aspirin 81 mg which she is tolerating much better without recurrent bruising.  She is starting Zocor well as well without muscle aches and pains.  Blood  pressures well controlled today it is 135/87.  She is exercising regularly and goes to gym 2 days a week and walks 2 miles a day.  She is eating healthy.  She is lost weight.  She has had no recurrent stroke or TIA symptoms.  She has not had any follow-up lipid profile carotid ultrasound checked yet.  ROS:   14 system review of systems is positive for bruising only all other systems negative PMH:  Past Medical History:  Diagnosis Date  . Acute CVA (cerebrovascular accident) ([place]) 04/16/2019  . Allergy   . Anxiety 04/15/2019  . Bradycardia with 41-50 beats per minute 04/15/2019  . Factor V Leiden mutation ([place])   . Osteopenia 04/15/2019  . Slurred speech 04/15/2019  . TIA due to embolism ([place]) 04/15/2019    Social History:  Social History   Socioeconomic History  . Marital status: Married    Spouse name: Not on file  . Number of children: Not on file  . Years of education: Not on file  . Highest education level: Not on file  Occupational History  . Not on file  Tobacco Use  . Smoking status: Never Smoker  . Smokeless tobacco: Never Used  Vaping Use  . Vaping Use: Never used  Substance and Sexual Activity  . Alcohol use: Yes    Comment: about 3 oz. 7 nights a week   . Drug use: No  . Sexual activity: Not on file  Other Topics Concern  . Not on file  Social History Narrative   Lives with husband  Social Determinants of Health   Financial Resource Strain:   . Difficulty of Paying Living Expenses: Not on file  Food Insecurity:   . Worried About Charity fundraiser in the Last Year: Not on file  . Ran Out of Food in the Last Year: Not on file  Transportation Needs:   . Lack of Transportation (Medical): Not on file  . Lack of Transportation (Non-Medical): Not on file  Physical Activity:   . Days of Exercise per Week: Not on file  . Minutes of Exercise per Session: Not on file  Stress:   . Feeling of Stress : Not on file  Social Connections:   . Frequency of  Communication with Friends and Family: Not on file  . Frequency of Social Gatherings with Friends and Family: Not on file  . Attends Religious Services: Not on file  . Active Member of Clubs or Organizations: Not on file  . Attends Archivist Meetings: Not on file  . Marital Status: Not on file  Intimate Partner Violence:   . Fear of Current or Ex-Partner: Not on file  . Emotionally Abused: Not on file  . Physically Abused: Not on file  . Sexually Abused: Not on file    Medications:   Current Outpatient Medications on File Prior to Visit  Medication Sig Dispense Refill  . Ascorbic Acid (VITAMIN C PO) Take 1 tablet by mouth daily.    Marland Kitchen aspirin EC 81 MG tablet Take 81 mg by mouth daily. Swallow whole.    Marland Kitchen CALCIUM PO Take 600 mg by mouth in the morning and at bedtime.    . cetirizine (ZYRTEC) 5 MG tablet Take 2.5-5 mg by mouth daily.     . clonazePAM (KLONOPIN) 0.5 MG tablet Take 0.25-0.5 mg by mouth at bedtime.     . ferrous fumarate (HEMOCYTE - 106 MG FE) 325 (106 FE) MG TABS tablet Take 1 tablet by mouth.    . ibandronate (BONIVA) 150 MG tablet Take 150 mg by mouth every 30 (thirty) days.    . Multiple Vitamins-Minerals (PRESERVISION AREDS 2 PO) Take by mouth.    . Omega-3 Fatty Acids (FISH OIL CONCENTRATE PO) Take 1 tablet by mouth daily.     . simvastatin (ZOCOR) 40 MG tablet Take 1 tablet (40 mg total) by mouth every evening. 30 tablet 11  . VITAMIN D, ERGOCALCIFEROL, PO Take 1 tablet by mouth daily.      No current facility-administered medications on file prior to visit.    Allergies:  No Known Allergies  Physical Exam General: well developed, well nourished, seated, in no evident distress Head: head normocephalic and atraumatic.  Neck: supple with no carotid or supraclavicular bruits Cardiovascular: regular rate and rhythm, no murmurs Musculoskeletal: no deformity Skin:  no rash/petichiae Vascular:  Normal pulses all extremities Vitals:   01/27/20 1455  BP:  135/87  Pulse: (!) 54   Neurologic Exam Mental Status: Awake and fully alert. Oriented to place and time. Recent and remote memory intact. Attention span, concentration and fund of knowledge appropriate. Mood and affect appropriate.  Cranial Nerves: Fundoscopic exam reveals sharp disc margins. Pupils equal, briskly reactive to light. Extraocular movements full without nystagmus. Visual fields full to confrontation. Hearing intact. Facial sensation intact. Face, tongue, palate moves normally and symmetrically.  Motor: Normal bulk and tone. Normal strength in all tested extremity muscles. Sensory.: intact to touch ,pinprick .position and vibratory sensation.  Coordination: Rapid alternating movements normal in all extremities. Finger-to-nose and  heel-to-shin performed accurately bilaterally. Gait and Station: Arises from chair without difficulty. Stance is normal. Gait demonstrates normal stride length and balance . Able to heel, toe and tandem walk without difficulty.  Reflexes: 1+ and symmetric. Toes downgoing.   NIHSS  0 Modified Rankin  0  ASSESSMENT: 69 year old Caucasian lady with left parietal embolic cortical infarct in November 2020 of cryptogenic etiology patient participated in the [place] stroke trial and has completed participation.  Vascular risk factors of hyperlipidemia only     PLAN: I had a long d/w patient about her recent cryptogenic stroke, risk for recurrent stroke/TIAs, personally independently reviewed imaging studies and stroke evaluation results and answered questions.Continue aspirin 81 mg daily  for secondary stroke prevention and maintain strict control of hypertension with blood pressure goal below 130/90, diabetes with hemoglobin A1c goal below 6.5% and lipids with LDL cholesterol goal below 70 mg/dL. I also advised the patient to eat a healthy diet with plenty of whole grains, cereals, fruits and vegetables, exercise regularly and maintain ideal body weight.   Check surveillance carotid ultrasound, lipid profile and hemoglobin A1c.  Followup in the future with my nurse practitioner Jacqueline Bruce in 1 year or call earlier if necessary.Greater than 50% of time during this 25 minute visit was spent on counseling,explanation of diagnosis, planning of further management, discussion with patient and family and coordination of care Antony Contras, MD Note: This document was prepared with digital dictation and possible smart phrase technology. Any transcriptional errors that result from this process are unintentional

## 2020-01-27 NOTE — Patient Instructions (Signed)
I had a long d/w patient about her recent cryptogenic stroke, risk for recurrent stroke/TIAs, personally independently reviewed imaging studies and stroke evaluation results and answered questions.Continue aspirin 81 mg daily  for secondary stroke prevention and maintain strict control of hypertension with blood pressure goal below 130/90, diabetes with hemoglobin A1c goal below 6.5% and lipids with LDL cholesterol goal below 70 mg/dL. I also advised the patient to eat a healthy diet with plenty of whole grains, cereals, fruits and vegetables, exercise regularly and maintain ideal body weight.  Check surveillance carotid ultrasound, lipid profile and hemoglobin A1c.  Followup in the future with my nurse practitioner Janett Billow in 1 year or call earlier if necessary.  Stroke Prevention Some medical conditions and behaviors are associated with a higher chance of having a stroke. You can help prevent a stroke by making nutrition, lifestyle, and other changes, including managing any medical conditions you may have. What nutrition changes can be made?   Eat healthy foods. You can do this by: ? Choosing foods high in fiber, such as fresh fruits and vegetables and whole grains. ? Eating at least 5 or more servings of fruits and vegetables a day. Try to fill half of your plate at each meal with fruits and vegetables. ? Choosing lean protein foods, such as lean cuts of meat, poultry without skin, fish, tofu, beans, and nuts. ? Eating low-fat dairy products. ? Avoiding foods that are high in salt (sodium). This can help lower blood pressure. ? Avoiding foods that have saturated fat, trans fat, and cholesterol. This can help prevent high cholesterol. ? Avoiding processed and premade foods.  Follow your health care provider's specific guidelines for losing weight, controlling high blood pressure (hypertension), lowering high cholesterol, and managing diabetes. These may include: ? Reducing your daily calorie  intake. ? Limiting your daily sodium intake to 1,500 milligrams (mg). ? Using only healthy fats for cooking, such as olive oil, canola oil, or sunflower oil. ? Counting your daily carbohydrate intake. What lifestyle changes can be made?  Maintain a healthy weight. Talk to your health care provider about your ideal weight.  Get at least 30 minutes of moderate physical activity at least 5 days a week. Moderate activity includes brisk walking, biking, and swimming.  Do not use any products that contain nicotine or tobacco, such as cigarettes and e-cigarettes. If you need help quitting, ask your health care provider. It may also be helpful to avoid exposure to secondhand smoke.  Limit alcohol intake to no more than 1 drink a day for nonpregnant women and 2 drinks a day for men. One drink equals 12 oz of beer, 5 oz of wine, or 1 oz of hard liquor.  Stop any illegal drug use.  Avoid taking birth control pills. Talk to your health care provider about the risks of taking birth control pills if: ? You are over 50 years old. ? You smoke. ? You get migraines. ? You have ever had a blood clot. What other changes can be made?  Manage your cholesterol levels. ? Eating a healthy diet is important for preventing high cholesterol. If cholesterol cannot be managed through diet alone, you may also need to take medicines. ? Take any prescribed medicines to control your cholesterol as told by your health care provider.  Manage your diabetes. ? Eating a healthy diet and exercising regularly are important parts of managing your blood sugar. If your blood sugar cannot be managed through diet and exercise, you may need to  take medicines. ? Take any prescribed medicines to control your diabetes as told by your health care provider.  Control your hypertension. ? To reduce your risk of stroke, try to keep your blood pressure below 130/80. ? Eating a healthy diet and exercising regularly are an important part  of controlling your blood pressure. If your blood pressure cannot be managed through diet and exercise, you may need to take medicines. ? Take any prescribed medicines to control hypertension as told by your health care provider. ? Ask your health care provider if you should monitor your blood pressure at home. ? Have your blood pressure checked every year, even if your blood pressure is normal. Blood pressure increases with age and some medical conditions.  Get evaluated for sleep disorders (sleep apnea). Talk to your health care provider about getting a sleep evaluation if you snore a lot or have excessive sleepiness.  Take over-the-counter and prescription medicines only as told by your health care provider. Aspirin or blood thinners (antiplatelets or anticoagulants) may be recommended to reduce your risk of forming blood clots that can lead to stroke.  Make sure that any other medical conditions you have, such as atrial fibrillation or atherosclerosis, are managed. What are the warning signs of a stroke? The warning signs of a stroke can be easily remembered as BEFAST.  B is for balance. Signs include: ? Dizziness. ? Loss of balance or coordination. ? Sudden trouble walking.  E is for eyes. Signs include: ? A sudden change in vision. ? Trouble seeing.  F is for face. Signs include: ? Sudden weakness or numbness of the face. ? The face or eyelid drooping to one side.  A is for arms. Signs include: ? Sudden weakness or numbness of the arm, usually on one side of the body.  S is for speech. Signs include: ? Trouble speaking (aphasia). ? Trouble understanding.  T is for time. ? These symptoms may represent a serious problem that is an emergency. Do not wait to see if the symptoms will go away. Get medical help right away. Call your local emergency services (911 in the [place] Do not drive yourself to the hospital.  Other signs of stroke may include: ? A sudden, severe headache  with no known cause. ? Nausea or vomiting. ? Seizure. Where to find more information For more information, visit:  American Stroke Association: www.strokeassociation.org  National Stroke Association: www.stroke.org Summary  You can prevent a stroke by eating healthy, exercising, not smoking, limiting alcohol intake, and managing any medical conditions you may have.  Do not use any products that contain nicotine or tobacco, such as cigarettes and e-cigarettes. If you need help quitting, ask your health care provider. It may also be helpful to avoid exposure to secondhand smoke.  Remember BEFAST for warning signs of stroke. Get help right away if you or a loved one has any of these signs. This information is not intended to replace advice given to you by your health care provider. Make sure you discuss any questions you have with your health care provider. Document Revised: 04/20/2017 Document Reviewed: 06/13/2016 Elsevier Patient Education  2020 [employer].

## 2020-01-28 LAB — HEMOGLOBIN A1C
Est. average glucose Bld gHb Est-mCnc: 108 mg/dL
Hgb A1c MFr Bld: 5.4 % (ref 4.8–5.6)

## 2020-01-28 LAB — LIPID PANEL
Chol/HDL Ratio: 1.8 ratio (ref 0.0–4.4)
Cholesterol, Total: 176 mg/dL (ref 100–199)
HDL: 98 mg/dL (ref >39)
LDL Chol Calc (NIH): 64 mg/dL (ref 0–99)
Triglycerides: 76 mg/dL (ref 0–149)
VLDL Cholesterol Cal: 14 mg/dL (ref 5–40)

## 2020-01-28 NOTE — Progress Notes (Signed)
Kindly inform the patient that lipid profile and screening test for diabetes were both satisfactory

## 2020-02-06 ENCOUNTER — Telehealth: Payer: Self-pay | Admitting: Neurology

## 2020-02-06 NOTE — Telephone Encounter (Signed)
Pt called wanting to know why she was referred to get scheduled for VAS US CAROTID. Pt states that this was not mentioned to her and is wanting to know if something after the fact was noticed for this test to be ordered for her. Pt's appt is Monday 20th at 1pm and she would like to be called before this appt. Please advise.

## 2020-02-09 ENCOUNTER — Encounter (HOSPITAL_COMMUNITY): Payer: Medicare Other

## 2020-02-09 NOTE — Telephone Encounter (Signed)
Spoke to pt, explained MD treatment plan during last visit for stroke prevention.  Pt voiced understanding

## 2020-02-13 ENCOUNTER — Other Ambulatory Visit: Payer: Self-pay

## 2020-02-13 ENCOUNTER — Ambulatory Visit (HOSPITAL_COMMUNITY)
Admission: RE | Admit: 2020-02-13 | Discharge: 2020-02-13 | Disposition: A | Discharge status: Home / Self Care | Payer: Medicare Other | Source: Ambulatory Visit | Attending: Neurology | Admitting: Neurology

## 2020-02-13 DIAGNOSIS — I639 Cerebral infarction, unspecified: Secondary | ICD-10-CM

## 2020-02-13 NOTE — Progress Notes (Signed)
Carotid duplex has been completed.   Preliminary results in CV Proc.   [name] 02/13/2020 1:19 PM

## 2020-02-23 ENCOUNTER — Ambulatory Visit (INDEPENDENT_AMBULATORY_CARE_PROVIDER_SITE_OTHER): Payer: Medicare Other

## 2020-02-23 DIAGNOSIS — I639 Cerebral infarction, unspecified: Secondary | ICD-10-CM

## 2020-02-23 LAB — CUP PACEART REMOTE DEVICE CHECK
Date Time Interrogation Session: 20210929230401
Implantable Pulse Generator Implant Date: 20210107

## 2020-02-23 NOTE — Progress Notes (Signed)
Kindly inform the patient that carotid ultrasound study showed no significant narrowing of either carotid arteries in the neck.

## 2020-02-24 ENCOUNTER — Telehealth: Payer: Self-pay | Admitting: Emergency Medicine

## 2020-02-24 NOTE — Telephone Encounter (Signed)
Called and spoke to Patient, informed patient of Dr. Clydene Fake review of carotid ultrasound and findings.  Patient denied any questions and expressed appreciation for call.

## 2020-02-24 NOTE — Telephone Encounter (Signed)
-----   Message from Garvin Fila, MD sent at 02/23/2020  8:41 AM EDT ----- Mitchell Heir inform the patient that carotid ultrasound study showed no significant narrowing of either carotid arteries in the neck.

## 2020-02-25 NOTE — Progress Notes (Signed)
Carelink Summary Report / Loop Recorder 

## 2020-03-18 ENCOUNTER — Telehealth: Payer: Self-pay | Admitting: *Deleted

## 2020-03-18 NOTE — Telephone Encounter (Signed)
   Primary Cardiologist: Cristopher Peru, MD  Chart reviewed as part of pre-operative protocol coverage. Patient was contacted 03/18/2020 in reference to pre-operative risk assessment for pending surgery as outlined below.  Jacqueline Bruce was last seen on 01/09/20 by Dr. Lovena Le.  Since that day, Jacqueline Bruce has done fine from a cardiac standpoint. She walks two miles a day with her dog and goes to a gym a couple times a week for a rigorous work-out. She can easily complete 4 METs without anginal complaints.  Therefore, based on ACC/AHA guidelines, the patient would be at acceptable risk for the planned procedure without further cardiovascular testing.  From a cardiology standpoint, no objections to holding aspirin, however she does have a history of CVA, therefore will defer final recommendations for holding aspirin to Dr. Leonie Man (neurology). Please contact Dr. Clydene Fake office for his input.    The patient was advised that if she develops new symptoms prior to surgery to contact our office to arrange for a follow-up visit, and she verbalized understanding.  I will route this recommendation to the requesting party via Epic fax function and remove from pre-op pool. Please call with questions.  Abigail Butts, PA-C 03/18/2020, 3:24 PM

## 2020-03-18 NOTE — Telephone Encounter (Signed)
   [facility] Pre-operative Risk Assessment    HEARTCARE STAFF: - Please ensure there is not already an duplicate clearance open for this procedure. - Under Visit Info/Reason for Call, type in Other and utilize the format Clearance MM/DD/YY or Clearance TBD. Do not use dashes or single digits. - If request is for dental extraction, please clarify the # of teeth to be extracted.  Request for surgical clearance:  1. What type of surgery is being performed? COLONOSCOPY    2. When is this surgery scheduled? 04/28/20   3. What type of clearance is required (medical clearance vs. Pharmacy clearance to hold med vs. Both)? MEDICAL  4. Are there any medications that need to be held prior to surgery and how long? ASA    5. Practice name and name of physician performing surgery? [facility]; DR. Gwyndolyn Saxon OUTLAW   6. What is the office phone number? [phone]   7.   What is the office fax number? [phone]  8.   Anesthesia type (None, local, MAC, general) ? NOT LISTED   Julaine Hua 03/18/2020, 10:59 AM  _________________________________________________________________   (provider comments below)

## 2020-03-25 LAB — CUP PACEART REMOTE DEVICE CHECK
Date Time Interrogation Session: 20211101230353
Implantable Pulse Generator Implant Date: 20210107

## 2020-03-29 ENCOUNTER — Ambulatory Visit (INDEPENDENT_AMBULATORY_CARE_PROVIDER_SITE_OTHER): Payer: Medicare Other

## 2020-03-29 DIAGNOSIS — I639 Cerebral infarction, unspecified: Secondary | ICD-10-CM | POA: Diagnosis not present

## 2020-03-30 NOTE — Progress Notes (Signed)
Carelink Summary Report / Loop Recorder 

## 2020-04-08 ENCOUNTER — Telehealth: Payer: Self-pay | Admitting: Neurology

## 2020-04-08 NOTE — Telephone Encounter (Signed)
Discussed Dr. Clydene Fake recommendation, discussed her stroke was over a year ago, she has had a stroke, she is at a higher risk than someone who hasn't had a stroke and if she stops taking the aspirin then her risk is greater than not taking aspirin than with taking aspirin.  Discussed with patient, she needs to decide if the risk of not having a colonoscopy is greater than for her than if she has it. There is a risk to more than just having a stroke as  related to having the colonoscopy.   She said she was going to discuss it with the surgeon but she feels it is an acceptable risk as she has a history of polyps and doesn't wasn't colon cancer.  Patient expressed appreciation.

## 2020-04-08 NOTE — Telephone Encounter (Signed)
Pt called, scheduling a colonoscopy in mid January 2022. Dr. Leonie Man told them that if I proceed with the colonoscopy, a small risk of stroke. My question is what is considered a small risk? Would like a call from the nurse before I confirm the colonoscopy.

## 2020-05-02 LAB — CUP PACEART REMOTE DEVICE CHECK
Date Time Interrogation Session: 20211204230307
Implantable Pulse Generator Implant Date: 20210107

## 2020-05-03 ENCOUNTER — Ambulatory Visit (INDEPENDENT_AMBULATORY_CARE_PROVIDER_SITE_OTHER): Payer: Medicare Other

## 2020-05-03 DIAGNOSIS — I639 Cerebral infarction, unspecified: Secondary | ICD-10-CM

## 2020-05-18 NOTE — Progress Notes (Signed)
Carelink Summary Report / Loop Recorder 

## 2020-06-01 ENCOUNTER — Other Ambulatory Visit: Payer: Self-pay | Admitting: Neurology

## 2020-06-07 ENCOUNTER — Ambulatory Visit (INDEPENDENT_AMBULATORY_CARE_PROVIDER_SITE_OTHER): Payer: Medicare Other

## 2020-06-07 DIAGNOSIS — I639 Cerebral infarction, unspecified: Secondary | ICD-10-CM

## 2020-06-09 DIAGNOSIS — H353132 Nonexudative age-related macular degeneration, bilateral, intermediate dry stage: Secondary | ICD-10-CM | POA: Diagnosis not present

## 2020-06-10 LAB — CUP PACEART REMOTE DEVICE CHECK
Date Time Interrogation Session: 20220115230444
Implantable Pulse Generator Implant Date: 20210107

## 2020-06-15 DIAGNOSIS — Z01812 Encounter for preprocedural laboratory examination: Secondary | ICD-10-CM | POA: Diagnosis not present

## 2020-06-18 DIAGNOSIS — K648 Other hemorrhoids: Secondary | ICD-10-CM | POA: Diagnosis not present

## 2020-06-18 DIAGNOSIS — K5730 Diverticulosis of large intestine without perforation or abscess without bleeding: Secondary | ICD-10-CM | POA: Diagnosis not present

## 2020-06-18 DIAGNOSIS — Z86010 Personal history of colonic polyps: Secondary | ICD-10-CM | POA: Diagnosis not present

## 2020-06-22 NOTE — Progress Notes (Signed)
Carelink Summary Report / Loop Recorder 

## 2020-07-12 ENCOUNTER — Ambulatory Visit (INDEPENDENT_AMBULATORY_CARE_PROVIDER_SITE_OTHER): Payer: Medicare Other

## 2020-07-12 DIAGNOSIS — I639 Cerebral infarction, unspecified: Secondary | ICD-10-CM

## 2020-07-13 LAB — CUP PACEART REMOTE DEVICE CHECK
Date Time Interrogation Session: 20220217230744
Implantable Pulse Generator Implant Date: 20210107

## 2020-07-14 NOTE — Progress Notes (Signed)
@Patient  ID: Jacqueline Bruce, female    DOB: 07/10/1950, 70 y.o.   MRN: [id number]  Chief Complaint  Patient presents with  . Follow-up    Dyspnea improved     Referring provider: Lennie Odor, PA  Synopsis: Referred in June 2021 for abnormal CXR by Lennie Odor, PA.  HPI: 70 year old female, never smoked.  Past medical history significant for a CVA, osteopenia, anxiety, bradycardia.  Patient of Dr. Carlis Abbott, seen for initial consult on 11/06/2019 for dyspnea on exertion.  Previous [facility] pulmonary encounters 11/06/2019-Dr. Carlis Abbott, consult Mrs. Vandivier is a 70 year old woman who presents for evaluation of shortness of breath for many years.  This has been stable over this time.  She did not notice any difference after losing weight weight several years ago.  She has maintained a high level of activity, but notices she gets dyspneic when walking up stairs and hills.  Her symptoms are also worse when she is hot.  She denies coughing, wheezing, sputum production.  Her symptoms improve when she stops to rest and bends over to make sure that she is getting good oxygen to her brain.  Drinking water and staying hydrated has also helped.  When she gets shortness of breath episode she feels like she is faint and gets a sensation that blood is draining out of her head.  She has never smoked.  There is no personal or family history of pulmonary disease.  No history of VTE, anemia, kidney disease.  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  Subsequent work-up was negative for shunt or other causes to explain her stroke.  Thankfully she has no persistent deficits.  10/01/2019 PCP note reviewed  01/13/2020 Patient presents today for 2 to 65-month follow-up.  During last visit with Dr. Carlis Abbott chest x-ray was reviewed, hyperinflation likely artifactual due to low overall soft tissue mass.  No evidence of hemidiaphragm flattening to suggest hyperinflation.  Encourage patient to continue with regular physical physical  exercise to maintain exercise tolerance.  She was ordered for pulmonary function testing.  Hx stroke. She reports shortness of breath going up 12 stairs. This is not new and has been going on for several years. Occasional chest tightness. No cough. She gets regular exercise. She needs to bend over toe catch her breath. Her symptoms are worse in heat and humidity. Reports lightheadedness, follows with cardiology and was seen last week  She is wearing monitor to monitor for afib - cup paceart remove device check was ok. Placed in February 2021.  Echocardiogram in November 2020 showed EF 60-65%. Normal LV function. Denies trouble swallowing .   07/15/2020- Interim hx  Patient presents today for 6 month follow-up for dyspnea. She is doing much better, she has been going to the gym regularly and reports less shortness of breath. Her trainer has noticed that she is not bending over as much to catch her breath. States that she is not as winded working out. She has been using incentive spirometer and averaging around 1500-2023ml. Denies active symptoms of chest tightness, wheezing or cough.    Chest Imaging- films reviewed: CXR, 2 view 10/01/2019-mild scoliosis, thoracic compression fracture with loss of disc height.  No masses or opacities.  External monitoring device overlying the mid chest. Perception of hyperinflation is likely artifactual due to low overall soft tissue mass.  No evidence of hemidiaphragm flattening to suggest hyperinflation.  CTA neck 04/15/2019, lung images reviewed-normal lung parenchyma  Pulmonary Functions Testing Results: 01/13/2020 - FVC 3.31 (  96%), FEV1 2.74 (104%), ratio 83, TLC 103%, DLCOcor 26.85 (123%) Interpretation: No evidence of restriction or obstruction. No BD response. Increase diffusion capacity.    Echocardiogram 04/15/2019: LVEF 60 to 65% with normal LV motion.  Normal LA, RV, RA.  Normal valves. Follow-up TEE 04/25/2019 without shunts  No Known  Allergies  Immunization History  Administered Date(s) Administered  . Fluad Quad(high Dose 65+) 02/26/2019  . Influenza Split 07/10/2016, 03/15/2018, 02/26/2019, 03/09/2020  . PFIZER(Purple Top)SARS-COV-2 Vaccination 06/28/2019, 07/19/2019, 03/09/2020  . Pneumococcal Conjugate-13 01/11/2017  . Pneumococcal Polysaccharide-23 12/14/2011, 04/01/2018  . Td 04/21/2004  . Tdap 09/22/2010    Past Medical History:  Diagnosis Date  . Acute CVA (cerebrovascular accident) ([place]) 04/16/2019  . Allergy   . Anxiety 04/15/2019  . Bradycardia with 41-50 beats per minute 04/15/2019  . Factor V Leiden mutation ([place])   . Osteopenia 04/15/2019  . Slurred speech 04/15/2019  . TIA due to embolism ([place]) 04/15/2019    Tobacco History: Social History   Tobacco Use  Smoking Status Never Smoker  Smokeless Tobacco Never Used   Counseling given: Not Answered   Outpatient Medications Prior to Visit  Medication Sig Dispense Refill  . Ascorbic Acid (VITAMIN C PO) Take 1 tablet by mouth daily.    Marland Kitchen aspirin EC 81 MG tablet Take 81 mg by mouth daily. Swallow whole.    Marland Kitchen CALCIUM PO Take 600 mg by mouth in the morning and at bedtime.    . cetirizine (ZYRTEC) 5 MG tablet Take 2.5-5 mg by mouth daily.     . clonazePAM (KLONOPIN) 0.5 MG tablet Take 0.25-0.5 mg by mouth at bedtime.     . ferrous fumarate (HEMOCYTE - 106 MG FE) 325 (106 FE) MG TABS tablet Take 1 tablet by mouth.    . ibandronate (BONIVA) 150 MG tablet Take 150 mg by mouth every 30 (thirty) days.    . Multiple Vitamins-Minerals (PRESERVISION AREDS 2 PO) Take by mouth.    . Omega-3 Fatty Acids (FISH OIL CONCENTRATE PO) Take 1 tablet by mouth daily.     . simvastatin (ZOCOR) 40 MG tablet TAKE ONE TABLET BY MOUTH EVERY EVENING 90 tablet 1  . VITAMIN D, ERGOCALCIFEROL, PO Take 1 tablet by mouth daily.      No facility-administered medications prior to visit.    Review of Systems  Review of Systems  Constitutional: Negative.   HENT: Negative.    Respiratory: Negative for cough, chest tightness, shortness of breath and wheezing.    Physical Exam  BP 116/74 (BP Location: Right Arm, Cuff Size: Normal)   Pulse (!) 46   Temp (!) 97.4 F (36.3 C) (Temporal)   Ht 5\' 7"  (1.702 m)   Wt 135 lb 12.8 oz (61.6 kg)   SpO2 99%   BMI 21.27 kg/m  Physical Exam Constitutional:      Appearance: Normal appearance.  HENT:     Head: Normocephalic and atraumatic.     Mouth/Throat:     Mouth: Mucous membranes are moist.     Pharynx: Oropharynx is clear.     Comments: Mallampati class 1 Cardiovascular:     Rate and Rhythm: Regular rhythm. Bradycardia present.  Pulmonary:     Effort: Pulmonary effort is normal.     Breath sounds: Normal breath sounds.     Comments: CTA Neurological:     General: No focal deficit present.     Mental Status: She is alert and oriented to person, place, and time. Mental status is at  baseline.  Psychiatric:        Mood and Affect: Mood normal.        Behavior: Behavior normal.        Thought Content: Thought content normal.        Judgment: Judgment normal.      Lab Results:  CBC    Component Value Date/Time   WBC 7.9 01/09/2020 1529   WBC 6.7 04/15/2019 1240   RBC 4.78 01/09/2020 1529   RBC 4.62 04/15/2019 1240   HGB 14.2 01/09/2020 1529   HCT 42.5 01/09/2020 1529   PLT 263 01/09/2020 1529   MCV 89 01/09/2020 1529   MCH 29.7 01/09/2020 1529   MCH 30.3 04/15/2019 1240   MCHC 33.4 01/09/2020 1529   MCHC 32.6 04/15/2019 1240   RDW 12.7 01/09/2020 1529   LYMPHSABS 2.0 01/09/2020 1529   MONOABS 0.5 04/15/2019 1240   EOSABS 0.1 01/09/2020 1529   BASOSABS 0.1 01/09/2020 1529    BMET    Component Value Date/Time   NA 136 04/15/2019 1252   K 4.5 04/15/2019 1252   CL 109 04/15/2019 1252   CO2 25 04/15/2019 1240   GLUCOSE 96 04/15/2019 1252   BUN 29 (H) 04/15/2019 1252   CREATININE 1.00 04/15/2019 1252   CALCIUM 10.5 (H) 04/15/2019 1240   GFRNONAA 51 (L) 04/15/2019 1240   GFRAA 59 (L)  04/15/2019 1240    BNP No results found for: BNP  ProBNP No results found for: PROBNP  Imaging: CUP PACEART REMOTE DEVICE CHECK  Result Date: 07/13/2020 ILR summary report received. Battery status OK. Normal device function. No new symptom, tachy, brady, or pause episodes. No new AF episodes. Monthly summary reports and ROV/PRN. HB    Assessment & Plan:   Dyspnea - Dyspnea on exertion has improved with regular exercise. She had normal pulmonary function testing in August. CXR showed no evidene of hemidiaphragm flattening to suggest hyperinflation. No active signs of chest tightness, wheezing or cough.  - Recommend she continue exercise regimen and using incentive spirometer  - Follow-up as needed if breathing worsens or you develop symptoms of chest tightness/wheezing/cough    Martyn Ehrich, NP 07/15/2020

## 2020-07-15 ENCOUNTER — Ambulatory Visit: Payer: Medicare Other | Admitting: Primary Care

## 2020-07-15 ENCOUNTER — Encounter: Payer: Self-pay | Admitting: Primary Care

## 2020-07-15 ENCOUNTER — Other Ambulatory Visit: Payer: Self-pay

## 2020-07-15 DIAGNOSIS — R0609 Other forms of dyspnea: Secondary | ICD-10-CM

## 2020-07-15 DIAGNOSIS — R0600 Dyspnea, unspecified: Secondary | ICD-10-CM | POA: Diagnosis not present

## 2020-07-15 NOTE — Patient Instructions (Signed)
Recommendations: Continue exercise program as you are  Continue to use incentive spirometer several times a day to practice deep breathing (see below)  Follow-up As needed if breathing worsens or you develop symptoms of chest tightness/wheezing/cough     How to Use an Incentive Spirometer An incentive spirometer is a tool that measures how well you are filling your lungs with each breath. Learning to take long, deep breaths using this tool can help you keep your lungs clear and active. This may help to reverse or lessen your chance of developing breathing (pulmonary) problems, especially infection. You may be asked to use a spirometer:  After a surgery.  If you have a lung problem or a history of smoking.  After a long period of time when you have been unable to move or be active. If the spirometer includes an indicator to show the highest number that you have reached, your health care provider or respiratory therapist will help you set a goal. Keep a log of your progress as told by your health care provider. What are the risks?  Breathing too quickly may cause dizziness or cause you to pass out. Take your time so you do not get dizzy or light-headed.  If you are in pain, you may need to take pain medicine before doing incentive spirometry. It is harder to take a deep breath if you are having pain. How to use your incentive spirometer 1. Sit up on the edge of your bed or on a chair. 2. Hold the incentive spirometer so that it is in an upright position. 3. Before you use the spirometer, breathe out normally. 4. Place the mouthpiece in your mouth. Make sure your lips are closed tightly around it. 5. Breathe in slowly and as deeply as you can through your mouth, causing the piston or the ball to rise toward the top of the chamber. 6. Hold your breath for 3-5 seconds, or for as long as possible. ? If the spirometer includes a coach indicator, use this to guide you in breathing. Slow down  your breathing if the indicator goes above the marked areas. 7. Remove the mouthpiece from your mouth and breathe out normally. The piston or ball will return to the bottom of the chamber. 8. Rest for a few seconds, then repeat the steps 10 or more times. ? Take your time and take a few normal breaths between deep breaths so that you do not get dizzy or light-headed. ? Do this every 1-2 hours when you are awake. 9. If the spirometer includes a goal marker to show the highest number you have reached (best effort), use this as a goal to work toward during each repetition. 10. After each set of 10 deep breaths, cough a few times. This will help to make sure that your lungs are clear. ? If you have an incision on your chest or abdomen from surgery, place a pillow or a rolled-up towel firmly against the incision when you cough. This can help to reduce pain while taking deep breaths and coughing.   General tips  When you are able to get out of bed: ? Walk around often. ? Continue to take deep breaths and cough in order to clear your lungs.  Keep using the incentive spirometer until your health care provider says it is okay to stop using it. If you have been in the hospital, you may be told to keep using the spirometer at home. Contact a health care provider if:  You are having difficulty using the spirometer.  You have trouble using the spirometer as often as instructed.  Your pain medicine is not giving enough relief for you to use the spirometer as told.  You have a fever. Get help right away if:  You develop shortness of breath.  You develop a cough with bloody mucus from the lungs.  You have fluid or blood coming from an incision site after you cough. Summary  An incentive spirometer is a tool that can help you learn to take long, deep breaths to keep your lungs clear and active.  You may be asked to use a spirometer after a surgery, if you have a lung problem or a history of  smoking, or if you have been inactive for a long period of time.  Use your incentive spirometer as instructed every 1-2 hours while you are awake.  If you have an incision on your chest or abdomen, place a pillow or a rolled-up towel firmly against your incision when you cough. This will help to reduce pain.  Get help right away if you have shortness of breath, you cough up bloody mucus, or blood comes from your incision when you cough. This information is not intended to replace advice given to you by your health care provider. Make sure you discuss any questions you have with your health care provider. Document Revised: 07/28/2019 Document Reviewed: 07/28/2019 Elsevier Patient Education  [place].

## 2020-07-15 NOTE — Assessment & Plan Note (Signed)
-   Dyspnea on exertion has improved with regular exercise. She had normal pulmonary function testing in August. CXR showed no evidene of hemidiaphragm flattening to suggest hyperinflation. No active signs of chest tightness, wheezing or cough.  - Recommend she continue exercise regimen and using incentive spirometer  - Follow-up as needed if breathing worsens or you develop symptoms of chest tightness/wheezing/cough

## 2020-07-20 NOTE — Progress Notes (Signed)
Carelink Summary Report / Loop Recorder 

## 2020-08-15 LAB — CUP PACEART REMOTE DEVICE CHECK
Date Time Interrogation Session: 20220322230256
Implantable Pulse Generator Implant Date: 20210107

## 2020-08-16 ENCOUNTER — Ambulatory Visit (INDEPENDENT_AMBULATORY_CARE_PROVIDER_SITE_OTHER): Payer: Medicare Other

## 2020-08-16 DIAGNOSIS — I639 Cerebral infarction, unspecified: Secondary | ICD-10-CM

## 2020-08-27 NOTE — Progress Notes (Signed)
Carelink Summary Report / Loop Recorder 

## 2020-09-13 ENCOUNTER — Ambulatory Visit (INDEPENDENT_AMBULATORY_CARE_PROVIDER_SITE_OTHER): Payer: Medicare Other

## 2020-09-13 DIAGNOSIS — I639 Cerebral infarction, unspecified: Secondary | ICD-10-CM | POA: Diagnosis not present

## 2020-09-14 LAB — CUP PACEART REMOTE DEVICE CHECK
Date Time Interrogation Session: 20220424230754
Implantable Pulse Generator Implant Date: 20210107

## 2020-10-01 NOTE — Progress Notes (Signed)
Carelink Summary Report / Loop Recorder 

## 2020-10-18 LAB — CUP PACEART REMOTE DEVICE CHECK
Date Time Interrogation Session: 20220527230438
Implantable Pulse Generator Implant Date: 20210107

## 2020-10-19 ENCOUNTER — Ambulatory Visit (INDEPENDENT_AMBULATORY_CARE_PROVIDER_SITE_OTHER): Payer: Medicare Other

## 2020-10-19 DIAGNOSIS — I639 Cerebral infarction, unspecified: Secondary | ICD-10-CM | POA: Diagnosis not present

## 2020-11-03 DIAGNOSIS — L821 Other seborrheic keratosis: Secondary | ICD-10-CM | POA: Diagnosis not present

## 2020-11-03 DIAGNOSIS — C44519 Basal cell carcinoma of skin of other part of trunk: Secondary | ICD-10-CM | POA: Diagnosis not present

## 2020-11-03 DIAGNOSIS — L814 Other melanin hyperpigmentation: Secondary | ICD-10-CM | POA: Diagnosis not present

## 2020-11-03 DIAGNOSIS — L570 Actinic keratosis: Secondary | ICD-10-CM | POA: Diagnosis not present

## 2020-11-03 DIAGNOSIS — Z85828 Personal history of other malignant neoplasm of skin: Secondary | ICD-10-CM | POA: Diagnosis not present

## 2020-11-11 NOTE — Progress Notes (Signed)
Carelink Summary Report / Loop Recorder 

## 2020-11-19 ENCOUNTER — Ambulatory Visit (INDEPENDENT_AMBULATORY_CARE_PROVIDER_SITE_OTHER): Payer: Medicare Other

## 2020-11-19 DIAGNOSIS — I639 Cerebral infarction, unspecified: Secondary | ICD-10-CM | POA: Diagnosis not present

## 2020-11-22 LAB — CUP PACEART REMOTE DEVICE CHECK
Date Time Interrogation Session: 20220629230700
Implantable Pulse Generator Implant Date: 20210107

## 2020-12-10 NOTE — Progress Notes (Signed)
Carelink Summary Report / Loop Recorder 

## 2020-12-21 LAB — CUP PACEART REMOTE DEVICE CHECK
Date Time Interrogation Session: 20220801230138
Implantable Pulse Generator Implant Date: 20210107

## 2020-12-22 ENCOUNTER — Ambulatory Visit (INDEPENDENT_AMBULATORY_CARE_PROVIDER_SITE_OTHER): Payer: Medicare Other

## 2020-12-22 DIAGNOSIS — I639 Cerebral infarction, unspecified: Secondary | ICD-10-CM | POA: Diagnosis not present

## 2020-12-31 ENCOUNTER — Ambulatory Visit (INDEPENDENT_AMBULATORY_CARE_PROVIDER_SITE_OTHER): Payer: Medicare Other | Admitting: Internal Medicine

## 2020-12-31 ENCOUNTER — Other Ambulatory Visit: Payer: Self-pay

## 2020-12-31 VITALS — BP 100/66 | HR 54 | Ht 67.0 in | Wt 136.0 lb

## 2020-12-31 DIAGNOSIS — I639 Cerebral infarction, unspecified: Secondary | ICD-10-CM | POA: Diagnosis not present

## 2020-12-31 NOTE — Progress Notes (Signed)
HPI Mrs. Starck returns today for ongoing followup. She has a h/o cryptogenic stroke, s/p ILR insertion over a year ago. In the interim she notes easy bruisability. No palpitations. No syncope. She is walking/hiking regularly but does have trouble with the humidity. No Known Allergies   Current Outpatient Medications  Medication Sig Dispense Refill   Ascorbic Acid (VITAMIN C PO) Take 1 tablet by mouth daily.     aspirin EC 81 MG tablet Take 81 mg by mouth daily. Swallow whole.     CALCIUM PO Take 600 mg by mouth in the morning and at bedtime.     cetirizine (ZYRTEC) 5 MG tablet Take 2.5-5 mg by mouth daily.      clonazePAM (KLONOPIN) 0.5 MG tablet Take 0.25-0.5 mg by mouth at bedtime.      ferrous fumarate (HEMOCYTE - 106 MG FE) 325 (106 FE) MG TABS tablet Take 1 tablet by mouth.     ibandronate (BONIVA) 150 MG tablet Take 150 mg by mouth every 30 (thirty) days.     Multiple Vitamins-Minerals (PRESERVISION AREDS 2 PO) Take 1 tablet by mouth daily.     Omega-3 Fatty Acids (FISH OIL CONCENTRATE PO) Take 1 tablet by mouth daily.      simvastatin (ZOCOR) 40 MG tablet TAKE ONE TABLET BY MOUTH EVERY EVENING 90 tablet 1   VITAMIN D, ERGOCALCIFEROL, PO Take 1 tablet by mouth daily.      No current facility-administered medications for this visit.     Past Medical History:  Diagnosis Date   Acute CVA (cerebrovascular accident) ([place]) 04/16/2019   Allergy    Anxiety 04/15/2019   Bradycardia with 41-50 beats per minute 04/15/2019   Factor V Leiden mutation ([place])    Osteopenia 04/15/2019   Slurred speech 04/15/2019   TIA due to embolism ([place]) 04/15/2019    ROS:   All systems reviewed and negative except as noted in the HPI.   Past Surgical History:  Procedure Laterality Date   BUBBLE STUDY  04/25/2019   Procedure: BUBBLE STUDY;  Surgeon: Fay Records, MD;  Location: [facility];  Service: Cardiovascular;;   TEE WITHOUT CARDIOVERSION N/A 04/25/2019   Procedure:  TRANSESOPHAGEAL ECHOCARDIOGRAM (TEE);  Surgeon: Fay Records, MD;  Location: [facility] ENDOSCOPY;  Service: Cardiovascular;  Laterality: N/A;     Family History  Problem Relation Age of Onset   Hypertension Mother    Factor V Leiden deficiency Mother        no history of DVT or PE   Hypertension Father    Heart disease Father        pacemaker and defib implanted    Atrial fibrillation Father    Stroke Father        at 4   Factor V Leiden deficiency Sister        no history of DVT or PE     Social History   Socioeconomic History   Marital status: Married    Spouse name: Not on file   Number of children: Not on file   Years of education: Not on file   Highest education level: Not on file  Occupational History   Not on file  Tobacco Use   Smoking status: Never   Smokeless tobacco: Never  Vaping Use   Vaping Use: Never used  Substance and Sexual Activity   Alcohol use: Yes    Comment: about 3 oz. 7 nights a week    Drug use: No  Sexual activity: Not on file  Other Topics Concern   Not on file  Social History Narrative   Lives with husband    Social Determinants of Health   Financial Resource Strain: Not on file  Food Insecurity: Not on file  Transportation Needs: Not on file  Physical Activity: Not on file  Stress: Not on file  Social Connections: Not on file  Intimate Partner Violence: Not on file     BP 100/66   Pulse (!) 54   Ht '5\' 7"'$  (1.702 m)   Wt 136 lb (61.7 kg)   SpO2 96%   BMI 21.30 kg/m   Physical Exam:  Well appearing NAD HEENT: Unremarkable Neck:  No JVD, no thyromegally Lymphatics:  No adenopathy Back:  No CVA tenderness Lungs:  Clear with no wheezes HEART:  Regular rate rhythm, no murmurs, no rubs, no clicks Abd:  soft, positive bowel sounds, no organomegally, no rebound, no guarding Ext:  2 plus pulses, no edema, no cyanosis, no clubbing Skin:  No rashes no nodules Neuro:  CN II through XII intact, motor grossly intact  EKG - sinus  bradycardia  DEVICE  Normal device function.  See PaceArt for details.   Assess/Plan:  Cryptogenic stroke - she has not had any atrial fib on the ILR. She will undergo watchful waiting. ILR - her Medtronic device has been interrogated and demonstrates no atrial fib, tachy or pauses.  Carleene Overlie Ashanty Coltrane,MD

## 2020-12-31 NOTE — Patient Instructions (Signed)
Medication Instructions:  Your physician recommends that you continue on your current medications as directed. Please refer to the Current Medication list given to you today.  Labwork: None ordered.  Testing/Procedures: None ordered.  Follow-Up: Your physician wants you to follow-up in: one year with Cristopher Peru, MD or one of the following Advanced Practice Providers on your designated Care Team:   Tommye Standard, [place] Legrand Como "Jonni Sanger" Chalmers Cater, [place]  Remote monitoring is used to monitor your loop recorder from home.   Any Other Special Instructions Will Be Listed Below (If Applicable).  If you need a refill on your cardiac medications before your next appointment, please call your pharmacy.

## 2021-01-03 ENCOUNTER — Other Ambulatory Visit: Payer: Self-pay

## 2021-01-03 ENCOUNTER — Other Ambulatory Visit: Payer: Self-pay | Admitting: Internal Medicine

## 2021-01-03 ENCOUNTER — Encounter: Payer: Self-pay | Admitting: Adult Health

## 2021-01-03 ENCOUNTER — Ambulatory Visit: Payer: Medicare Other | Admitting: Adult Health

## 2021-01-03 VITALS — BP 118/76 | HR 47 | Ht 67.0 in | Wt 136.6 lb

## 2021-01-03 DIAGNOSIS — E785 Hyperlipidemia, unspecified: Secondary | ICD-10-CM

## 2021-01-03 DIAGNOSIS — R0600 Dyspnea, unspecified: Secondary | ICD-10-CM

## 2021-01-03 DIAGNOSIS — R29818 Other symptoms and signs involving the nervous system: Secondary | ICD-10-CM

## 2021-01-03 DIAGNOSIS — R0609 Other forms of dyspnea: Secondary | ICD-10-CM

## 2021-01-03 DIAGNOSIS — I639 Cerebral infarction, unspecified: Secondary | ICD-10-CM | POA: Diagnosis not present

## 2021-01-03 NOTE — Progress Notes (Signed)
I agree with the above plan 

## 2021-01-03 NOTE — Progress Notes (Signed)
[facility] [address] [place]. [place] [address] [phone]       OFFICE FOLLOW-UP NOTE  [name] Date of Birth:  07/28/1950 Medical Record Number:  [id number]    Reason for visit: Stroke follow-up Primary neurologist: Dr. Leonie Man   Chief complaint: Chief Complaint  Patient presents with   Stroke    Rm 2,  one year FU  "doing well, just saw cardiology, still have heart monitor"      HPI:   Today, 01/03/2021, Jacqueline Bruce returns for 1 year stroke follow-up unaccompanied.  Overall stable.  No new or reoccurring stroke/TIA symptoms.  Compliant on aspirin and simvastatin -denies side effects.  Blood pressure today 118/76.  Loop recorder has not shown atrial fibrillation thus far. Occasional dyspnea on exertion worse with humidity over the past several years - extensive evaluation with pulmonology which was all unremarkable.  Remains very active going to the gym routinely but has been limited with outdoor activity over the summer.  Does report chronic history of insomnia with long-term use of clonazepam.  She also reports daytime fatigue with grogginess upon awakening.  Denies snoring or witnessed apneas. Has not previously had sleep study. No further concerns at this time.    History provided for reference purposes only Initial visit 01/27/2020 Dr. Leonie Man: Jacqueline Bruce is a pleasant 70 year old Caucasian lady seen today for initial office follow-up visit following stroke in November 2020.  History is obtained from her, review of electronic medical records as well as research study notes and personal review of imaging films in PACS.  She is a pleasant 70 year old lady with no prior past medical history except allergies.  She presented on 04/15/2019 with sudden onset of difficulty expressing herself while at [facility].  EMS were called during transport her symptoms rapidly improved.  While in the ER she also had transient 1 or 2 episodes of word finding  difficulties.  CT scan of the head was obtained which was unremarkable but subsequently MRI scan of the brain showed tiny punctate acute left parietal cortical infarct.  CT angiogram of the head and neck showed only mild atherosclerosis involving left carotid siphon with a 1 to 2 mm outpouching in the cavernous carotid possibly in front balloon.  There was calcified aortic sclerosis noted.  2D echo showed normal ejection fraction TEE and subsequently as an outpatient showed no cardiac source of embolism.  Patient had loop recorder inserted and so for paroxysmal A. fib has not yet been found.  LDL cholesterol 118 mg percent and hemoglobin A1c was 5.5.  Patient participated in the  [facility] axiomatic stroke prevention trial ( Factor 11 a inhibitor versus placebo) and completed participation.  She was placed on Plavix after that but call the office complaining of significant bruising.  After Plavix was stopped losing improved and we have switched her back to aspirin 81 mg which she is tolerating much better without recurrent bruising.  She is starting Zocor well as well without muscle aches and pains.  Blood pressures well controlled today it is 135/87.  She is exercising regularly and goes to gym 2 days a week and walks 2 miles a day.  She is eating healthy.  She is lost weight.  She has had no recurrent stroke or TIA symptoms.  She has not had any follow-up lipid profile carotid ultrasound checked yet.  ROS:   14 system review of systems is positive for those listed in HPI and all other systems negative  PMH:  Past Medical History:  Diagnosis Date   Acute CVA (cerebrovascular accident) ([place]) 04/16/2019   Allergy    Anxiety 04/15/2019   Bradycardia with 41-50 beats per minute 04/15/2019   Factor V Leiden mutation ([place])    Osteopenia 04/15/2019   Slurred speech 04/15/2019   TIA due to embolism ([place]) 04/15/2019    Social History:  Social History   Socioeconomic History   Marital status: Married     Spouse name: Not on file   Number of children: Not on file   Years of education: Not on file   Highest education level: Not on file  Occupational History   Not on file  Tobacco Use   Smoking status: Never   Smokeless tobacco: Never  Vaping Use   Vaping Use: Never used  Substance and Sexual Activity   Alcohol use: Yes    Comment: about 3 oz. some nights   Drug use: No   Sexual activity: Not on file  Other Topics Concern   Not on file  Social History Narrative   Lives with husband    Social Determinants of Health   Financial Resource Strain: Not on file  Food Insecurity: Not on file  Transportation Needs: Not on file  Physical Activity: Not on file  Stress: Not on file  Social Connections: Not on file  Intimate Partner Violence: Not on file    Medications:   Current Outpatient Medications on File Prior to Visit  Medication Sig Dispense Refill   Ascorbic Acid (VITAMIN C PO) Take 1 tablet by mouth daily.     aspirin EC 81 MG tablet Take 81 mg by mouth daily. Swallow whole.     CALCIUM PO Take 600 mg by mouth in the morning and at bedtime.     cetirizine (ZYRTEC) 5 MG tablet Take 2.5-5 mg by mouth daily.      clonazePAM (KLONOPIN) 0.5 MG tablet Take 0.25-0.5 mg by mouth at bedtime.      ferrous fumarate (HEMOCYTE - 106 MG FE) 325 (106 FE) MG TABS tablet Take 1 tablet by mouth.     ibandronate (BONIVA) 150 MG tablet Take 150 mg by mouth every 30 (thirty) days.     Multiple Vitamins-Minerals (PRESERVISION AREDS 2 PO) Take 1 tablet by mouth daily.     Omega-3 Fatty Acids (FISH OIL CONCENTRATE PO) Take 1 tablet by mouth daily.      simvastatin (ZOCOR) 40 MG tablet TAKE ONE TABLET BY MOUTH EVERY EVENING 90 tablet 1   VITAMIN D, ERGOCALCIFEROL, PO Take 1 tablet by mouth daily.      No current facility-administered medications on file prior to visit.    Allergies:  No Known Allergies  Physical Exam Today's Vitals   01/03/21 0803  BP: 118/76  Pulse: (!) 47  Weight: 136 lb  9.6 oz (62 kg)  Height: '5\' 7"'$  (1.702 m)   Body mass index is 21.39 kg/m.   General: well developed, well nourished, very pleasant middle-age Caucasian female, seated, in no evident distress Head: head normocephalic and atraumatic.  Neck: supple with no carotid or supraclavicular bruits Cardiovascular: regular rate and rhythm, no murmurs Musculoskeletal: no deformity Skin:  no rash/petichiae Vascular:  Normal pulses all extremities  Neurologic Exam Mental Status: Awake and fully alert.  Fluent speech and language.  Oriented to place and time. Recent and remote memory intact. Attention span, concentration and fund of knowledge appropriate. Mood and affect appropriate.  Cranial Nerves: Pupils equal, briskly reactive to light.  Extraocular movements full without nystagmus. Visual fields full to confrontation. Hearing intact. Facial sensation intact. Face, tongue, palate moves normally and symmetrically.  Motor: Normal bulk and tone. Normal strength in all tested extremity muscles. Sensory.: intact to touch ,pinprick .position and vibratory sensation.  Coordination: Rapid alternating movements normal in all extremities. Finger-to-nose and heel-to-shin performed accurately bilaterally. Gait and Station: Arises from chair without difficulty. Stance is normal. Gait demonstrates normal stride length and balance . Able to heel, toe and tandem walk without difficulty.  Reflexes: 1+ and symmetric. Toes downgoing.      ASSESSMENT: 70 year old Caucasian lady with left parietal embolic cortical infarct in November 2020 of cryptogenic etiology.  Patient participated in the [place] stroke trial and has completed participation.  Vascular risk factors of hyperlipidemia only. C/o chronic DOE, anxiety, insomnia and daytime fatigue    PLAN: -Continue aspirin 81 mg daily  for secondary stroke prevention and  -Loop recorder has not shown atrial fibrillation thus far -Routine follow-up with PCP to  maintain strict control of lipids with LDL >70 with prior LDL 64 (01/2020) -referral placed to [facility] for evaluation of possible sleep apnea -discussed DOE with full eval by pulm which was negative.  No evidence of anemia.  She is not obese and exercises routinely.  Symptoms stable although persistent.  Advised to contact pulmonology with any worsening symptoms. May need work up by cardiology - can discuss further with Dr. Lovena Le      CC:  [facility] provider: Dr. Siri Cole, [place], [place]   I spent 36 minutes of face-to-face and non-face-to-face time with patient.  This included previsit chart review, lab review, study review, order entry, electronic health record documentation, patient education and discussion regarding history of prior stroke, secondary stroke prevention measures and aggressive stroke risk factor management, review of loop recorder, DOE complaints, possible underlying sleep apnea and answered all other questions to patient satisfaction  Frann Rider, AGNP-BC  [facility] [address] [place], [place] [address]  Phone [phone] Fax [phone] Note: This document was prepared with digital dictation and possible smart phrase technology. Any transcriptional errors that result from this process are unintentional.

## 2021-01-03 NOTE — Patient Instructions (Addendum)
Continue aspirin 81 mg daily  and simvastatin  for secondary stroke prevention  Continue to follow up with PCP regarding cholesterol and blood pressure management  Maintain strict control of hypertension with blood pressure goal below 130/90 and cholesterol with LDL cholesterol (bad cholesterol) goal below 70 mg/dL.   Referral placed to our sleep clinic to evaluate for possible sleep apnea        Thank you for coming to see [place] at [facility]. I hope we have been able to provide you high quality care today.  You may receive a patient satisfaction survey over the next few weeks. We would appreciate your feedback and comments so that we may continue to improve ourselves and the health of our patients.

## 2021-01-18 NOTE — Progress Notes (Signed)
Carelink Summary Report / Loop Recorder 

## 2021-01-25 ENCOUNTER — Ambulatory Visit (INDEPENDENT_AMBULATORY_CARE_PROVIDER_SITE_OTHER): Payer: Medicare Other

## 2021-01-25 DIAGNOSIS — I639 Cerebral infarction, unspecified: Secondary | ICD-10-CM | POA: Diagnosis not present

## 2021-01-26 LAB — CUP PACEART REMOTE DEVICE CHECK
Date Time Interrogation Session: 20220903230636
Implantable Pulse Generator Implant Date: 20210107

## 2021-01-27 ENCOUNTER — Ambulatory Visit: Payer: Medicare Other | Admitting: Adult Health

## 2021-02-03 NOTE — Progress Notes (Signed)
Carelink Summary Report / Loop Recorder 

## 2021-02-17 DIAGNOSIS — R6889 Other general symptoms and signs: Secondary | ICD-10-CM | POA: Diagnosis not present

## 2021-02-28 ENCOUNTER — Ambulatory Visit (INDEPENDENT_AMBULATORY_CARE_PROVIDER_SITE_OTHER): Payer: Medicare Other

## 2021-02-28 DIAGNOSIS — I639 Cerebral infarction, unspecified: Secondary | ICD-10-CM

## 2021-03-02 LAB — CUP PACEART REMOTE DEVICE CHECK
Date Time Interrogation Session: 20221007000932
Implantable Pulse Generator Implant Date: 20210107

## 2021-03-09 NOTE — Progress Notes (Signed)
Carelink Summary Report / Loop Recorder 

## 2021-03-14 DIAGNOSIS — Z1231 Encounter for screening mammogram for malignant neoplasm of breast: Secondary | ICD-10-CM | POA: Diagnosis not present

## 2021-03-15 ENCOUNTER — Ambulatory Visit: Payer: Medicare Other | Admitting: Neurology

## 2021-03-15 ENCOUNTER — Telehealth: Payer: Self-pay | Admitting: Neurology

## 2021-03-15 ENCOUNTER — Encounter: Payer: Self-pay | Admitting: Neurology

## 2021-03-15 VITALS — BP 117/68 | HR 50 | Ht 67.0 in | Wt 136.0 lb

## 2021-03-15 DIAGNOSIS — F422 Mixed obsessional thoughts and acts: Secondary | ICD-10-CM

## 2021-03-15 DIAGNOSIS — F5104 Psychophysiologic insomnia: Secondary | ICD-10-CM | POA: Diagnosis not present

## 2021-03-15 DIAGNOSIS — I639 Cerebral infarction, unspecified: Secondary | ICD-10-CM

## 2021-03-15 DIAGNOSIS — R0609 Other forms of dyspnea: Secondary | ICD-10-CM

## 2021-03-15 NOTE — Telephone Encounter (Signed)
Referral sent to [facility].  Address: [address] [place], [place] [address] Phone [phone] Fax: [phone]

## 2021-03-15 NOTE — Patient Instructions (Addendum)
Insomnia Insomnia is a sleep disorder that makes it difficult to fall asleep or stay asleep. Insomnia can cause fatigue, low energy, difficulty concentrating, mood swings, and poor performance at work or school. There are three different ways to classify insomnia: Difficulty falling asleep. Difficulty staying asleep. Waking up too early in the morning. Any type of insomnia can be long-term (chronic) or short-term (acute). Both are common. Short-term insomnia usually lasts for three months or less. Chronic insomnia occurs at least three times a week for longer than three months. What are the causes? Insomnia may be caused by another condition, situation, or substance, such as: Anxiety. Certain medicines. Gastroesophageal reflux disease (GERD) or other gastrointestinal conditions. Asthma or other breathing conditions. Restless legs syndrome, sleep apnea, or other sleep disorders. Chronic pain. Menopause. Stroke. Abuse of alcohol, tobacco, or illegal drugs. Mental health conditions, such as depression. Caffeine. Neurological disorders, such as Alzheimer's disease. An overactive thyroid (hyperthyroidism). Sometimes, the cause of insomnia may not be known. What increases the risk? Risk factors for insomnia include: Gender. Women are affected more often than men. Age. Insomnia is more common as you get older. Stress. Lack of exercise. Irregular work schedule or working night shifts. Traveling between different time zones. Certain medical and mental health conditions. What are the signs or symptoms? If you have insomnia, the main symptom is having trouble falling asleep or having trouble staying asleep. This may lead to other symptoms, such as: Feeling fatigued or having low energy. Feeling nervous about going to sleep. Not feeling rested in the morning. Having trouble concentrating. Feeling irritable, anxious, or depressed. How is this diagnosed? This condition may be diagnosed  based on: Your symptoms and medical history. Your health care provider may ask about: Your sleep habits. Any medical conditions you have. Your mental health. A physical exam. How is this treated? Treatment for insomnia depends on the cause. Treatment may focus on treating an underlying condition that is causing insomnia. Treatment may also include: Medicines to help you sleep. Counseling or therapy. Lifestyle adjustments to help you sleep better. Follow these instructions at home: Eating and drinking  Limit or avoid alcohol, caffeinated beverages, and cigarettes, especially close to bedtime. These can disrupt your sleep. Do not eat a large meal or eat spicy foods right before bedtime. This can lead to digestive discomfort that can make it hard for you to sleep. Sleep habits  Keep a sleep diary to help you and your health care provider figure out what could be causing your insomnia. Write down: When you sleep. When you wake up during the night. How well you sleep. How rested you feel the next day. Any side effects of medicines you are taking. What you eat and drink. Make your bedroom a dark, comfortable place where it is easy to fall asleep. Put up shades or blackout curtains to block light from outside. Use a white noise machine to block noise. Keep the temperature cool. Limit screen use before bedtime. This includes: Watching TV. Using your smartphone, tablet, or computer. Stick to a routine that includes going to bed and waking up at the same times every day and night. This can help you fall asleep faster. Consider making a quiet activity, such as reading, part of your nighttime routine. Try to avoid taking naps during the day so that you sleep better at night. Get out of bed if you are still awake after 15 minutes of trying to sleep. Keep the lights down, but try reading or doing a  quiet activity. When you feel sleepy, go back to bed. General instructions Take over-the-counter  and prescription medicines only as told by your health care provider. Exercise regularly, as told by your health care provider. Avoid exercise starting several hours before bedtime. Use relaxation techniques to manage stress. Ask your health care provider to suggest some techniques that may work well for you. These may include: Breathing exercises. Routines to release muscle tension. Visualizing peaceful scenes. Make sure that you drive carefully. Avoid driving if you feel very sleepy. Keep all follow-up visits as told by your health care provider. This is important. Contact a health care provider if: You are tired throughout the day. You have trouble in your daily routine due to sleepiness. You continue to have sleep problems, or your sleep problems get worse. Get help right away if: You have serious thoughts about hurting yourself or someone else. If you ever feel like you may hurt yourself or others, or have thoughts about taking your own life, get help right away. You can go to your nearest emergency department or call: Your local emergency services (911 in the [place] A suicide crisis helpline, such as the [place] at 251 391 0289. This is open 24 hours a day. Summary Insomnia is a sleep disorder that makes it difficult to fall asleep or stay asleep. Insomnia can be long-term (chronic) or short-term (acute). Treatment for insomnia depends on the cause. Treatment may focus on treating an underlying condition that is causing insomnia. Keep a sleep diary to help you and your health care provider figure out what could be causing your insomnia. This information is not intended to replace advice given to you by your health care provider. Make sure you discuss any questions you have with your health care provider. Document Revised: 03/18/2020 Document Reviewed: 03/18/2020 Elsevier Patient Education  2022 [employer]. Managing Obsessive-Compulsive Disorder If you  have been diagnosed with obsessive-compulsive disorder (OCD), you may be relieved that you now know why you have felt or behaved a certain way. You may also feel overwhelmed about the treatment ahead, how to get the support you need, and how to deal with the condition day-to-day. With treatment and support, you can manage your OCD. How to manage lifestyle changes Managing stress Stress is your body's reaction to life changes and events, both good and bad. Stress can play a major role in OCD, so it is important to learn how to manage stress. Some techniques to manage stress include: Meditation, muscle relaxation, and breathing exercises. Exercise. Even a short daily walk can help to lower stress levels. Getting enough good-quality sleep. Spending time on hobbies that you enjoy. Accepting and letting go of things that you cannot change. To help you manage stress associated with OCD, your health care provider may recommend exposure and response prevention therapy. In this therapy, you will be exposed to the distressing situation that triggers your compulsion and be prevented from responding to it. With repetition of this process over time, you will no longer feel the distress or need to perform the compulsion. Medicines Your health care provider may suggest certain medicines for depression (antidepressants) if he or she feels that they will help to improve your condition. Avoid using alcohol and other substances that may prevent your medicines from working properly. It is also important to: Talk with your pharmacist or health care provider about all medicines that you take, their possible side effects, and which medicines are safe to take together. Make it your goal  to take part in all treatment decisions (shared decision-making). Ask about possible side effects of medicines that your health care provider recommends, and tell him or her how you feel about having those side effects. It is best if shared  decision-making with your health care provider is part of your total treatment plan. If you are taking medicines as part of your treatment, do not stop taking them before you ask your health care provider if it is safe to stop. You may need to have the medicine slowly decreased (tapered) over time to lower the risk of harmful side effects. Relationships Consider giving education materials to friends and family. Your family and friends may need to learn about your OCD in order for them to understand you and support you as you manage your condition. Family therapy may also help to lower stress and relieve tension. How to recognize changes in your condition Some signs that your condition may be getting worse include: Being anxious about germs or dirt. Having harmful thoughts about yourself or others. Making sure that household objects are alike or perfectly organized in a specific way. Having great difficulty making decisions, or second-guessing yourself after making a decision. Constant cleaning and handwashing. Repeating behavior such as repeatedly checking to see if a door is locked or the oven is off. Counting nonstop or uncontrollably. Follow these instructions at home: Medicines Take over-the-counter and prescription medicines only as told by your health care provider. Check with your health care provider before starting any new prescription or over-the-counter medicines. General instructions  Ask for support from trusted family members or friends to make sure you stay on track with your treatment. Keep a journal to write down your daily moods, medicines, sleep habits, and life events. Doing this may help you have more success with your treatment. Maintain a healthy lifestyle. Eat a healthy diet, exercise regularly, get plenty of sleep, and take time to relax. Keep all follow-up visits as told by your health care provider and therapist. This is important. Where to find support Talking to  others It may be difficult to tell loved ones about your condition, but they can be a good support system for you. You can work with your therapist to decide whom to tell and when to tell them. Here are some tips for starting the conversation: Start by sharing your experience with OCD. It is up to you how much detail you want to provide. Let your loved ones know that you are seeking treatment. Do not expect loved ones to understand your condition right away. Finances Not all insurance plans cover mental health care, so it is important to check with your insurance carrier. If paying for co-pays or counseling services is a problem, search for a local or county mental health care center. Public mental health care services may be offered there at a low cost or no cost when you are not able to see a private health care provider. If you are taking medicine for depression, you may be able to get the generic form, which may be less expensive than brand-name medicine. Some makers of prescription medicines also offer help to patients who cannot afford the medicines they need. Questions to ask your health care provider If you are taking medicines: How long do I need to take medicine? Are there any long-term side effects of my medicine? Are there any alternatives to taking medicine? How would I benefit from therapy? How often should I follow up with a health care provider?  Where to find more information International [employer]: www.iocdf.[employer] (NAMI): www.nami.org Contact a health care provider if: Your symptoms get worse or they do not get better with treatment. You develop new symptoms. Get help right away if: You have severe side effects after taking your medicine. You have thoughts about hurting yourself or others. If you ever feel like you may hurt yourself or others, or have thoughts about taking your own life, get help right away. You can go to your nearest  emergency department or call: Your local emergency services (911 in the [place] A suicide crisis helpline, such as the [place] at (352)808-5153. This is open 24 hours a day. Summary Stress can play a major role in obsessive-compulsive disorder (OCD). Learning ways to manage stress may help your treatment work better for you. If you are taking medicines as part of your treatment, do not stop taking medicines before you ask your health care provider if it is safe to stop. When talking with family members and friends about your OCD, decide how much detail you want to give them and be patient as they work to understand your condition. Keep all follow-up visits as told by your health care provider and therapist. This is important. This information is not intended to replace advice given to you by your health care provider. Make sure you discuss any questions you have with your health care provider. Document Revised: 02/27/2020 Document Reviewed: 01/22/2020 Elsevier Patient Education  2022 [employer].

## 2021-03-15 NOTE — Progress Notes (Addendum)
SLEEP MEDICINE CLINIC    Provider:  Larey Seat, MD  Primary Care Physician:  Lennie Odor, [address] [place] [address]     Referring Provider: Stroke MD  Frann Rider, [address] [place],  [place] [address]          Chief Complaint according to patient   Patient presents with:     New Patient (Initial Visit)           HISTORY OF PRESENT ILLNESS:  Jacqueline Bruce is a 70 y.o.  female stroke patient seen on 03/15/2021 from Dr Leonie Man and Frann Rider NP  for a consult.  Chief concern according to patient : " I haven't slept well for decades and decades, chronic sleep disorder INSOMNIA- but not snoring, shared bedroom on trip to [place] with a friend, where there was no report of snoring, no apnea. Her problem is not going to sleep, mind is racing. 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 Lost 30 pounds since her stroke 2 years ago.   On Lunesta since 2008, then [place], before that OTC, Tylenol PM, not ever Ambien. Now on klonopin.      Sleep relevant medical history: No Sleep walking, embolic CVA- no Tonsillectomy.    Family medical /sleep history: no family member on CPAP with OSA, father with insomnia, short sleeper.    Social history:  Patient is retired from Programmer, applications, Engineer, mining, last at [employer]- and lives in a household with her spouse, one ' disabled ' dog.    Never used to work in shifts( night/ rotating,)  Tobacco use: none .  ETOH use ; 1 glass of wine,  may be 5 nights a week. Caffeine intake in form of Coffee ( 1 cup in AM ) Soda( 1-3 cans a week). Regular exercise in form of hiking, walking the dog, which is behavior challenged.      Sleep habits are as follows: The patient's dinner time is between 6.30-7 PM.  Bedroom is cool, quiet and dark, she sleeps alone.  Lost 30 pounds since her stroke 2 years ago. The patient goes to bed at 9.30 PM and continues to struggle to go to sleep , she reads at night, takes sleep aid. She falls asleep for 4-7 hours, wakes usually at 4 AM -5 not for bathroom breaks. This is with medication- she reports not being able to sleep without medication- at all.  Has tried Meditation, reading,  The preferred sleep position is , with the support of book . 1-2 pillows. Dreams are reportedly infrequent/vivid.  4-6  AM is the usual rise time. The patient wakes up spontaneously.  She reports not feeling refreshed or restored in AM, with symptoms such as dry mouth and residual fatigue.  Naps are taken infrequently, usually after ; fitness work out, husband gets home from work at 4.30 pm- she will nap for one hour before that.  Always naps on the couch , nearly always with TV or music .  Naps are refreshing as  nocturnal sleep.   Review of Systems: Out of a complete 14 system review, the patient complains of only the following symptoms, and all other reviewed systems are negative.:  Fatigue, not sleepiness ,not snoring, fragmented sleep,short sleep.  Insomnia for ever. MIND IS RACING.   Report stated she had SOB, wheezing, but has been cleared pulmonary. Cardiology has cleared her.    How likely are you to doze in the following situations: 0 = not likely, 1 = slight chance, 2 = moderate chance, 3 = high chance   Sitting and Reading? Watching Television? Sitting inactive in a public place (theater or meeting)? As a passenger in a car for an hour without a break? Lying down in the afternoon when circumstances  permit? Sitting and talking to someone? Sitting quietly after lunch without alcohol? In a car, while stopped for a few minutes in traffic?   Total = 6/ 24 points   FSS endorsed at 46/ 63 points.   Social History   Socioeconomic History   Marital status: Married    Spouse name: Not on file   Number of children: Not on file   Years of education: Not on file   Highest education level: Not on file  Occupational History   Not on file  Tobacco Use   Smoking status: Never   Smokeless tobacco: Never  Vaping Use   Vaping Use: Never used  Substance and Sexual Activity   Alcohol use: Yes    Comment: about 3 oz. some nights   Drug use: No   Sexual activity: Not on file  Other Topics Concern   Not on file  Social History Narrative   Lives with husband    Right handed   Caffeine: 1 cup of coffee a day. Diet coke qotherd    Social Determinants of Health   Financial Resource Strain: Not on file  Food Insecurity: Not on file  Transportation Needs: Not on file  Physical Activity: Not on file  Stress: Not on file  Social Connections: Not on file    Family History  Problem Relation Age of Onset   Hypertension Mother    Factor V Leiden deficiency Mother        no history of DVT or PE   Alzheimer's disease Mother    Hypertension Father    Heart disease Father        pacemaker and defib implanted    Atrial fibrillation Father    Stroke Father        at 13   Factor V Leiden deficiency Sister        no history of DVT or PE    Past Medical History:  Diagnosis Date   Acute CVA (cerebrovascular accident) ([facility]) 04/16/2019   Allergy    Anxiety 04/15/2019   Bradycardia with 41-50 beats per minute 04/15/2019   Factor V Leiden mutation ([facility])    Osteopenia 04/15/2019   Slurred speech 04/15/2019   TIA due to embolism ([facility]) 04/15/2019    Past Surgical History:  Procedure Laterality Date   BUBBLE STUDY  04/25/2019   Procedure: BUBBLE STUDY;  Surgeon: Fay Records, MD;   Location: [facility];  Service: Cardiovascular;;   TEE WITHOUT CARDIOVERSION N/A 04/25/2019   Procedure: TRANSESOPHAGEAL ECHOCARDIOGRAM (TEE);  Surgeon: Fay Records, MD;  Location: [facility];  Service: Cardiovascular;  Laterality: N/A;     Current Outpatient Medications on File Prior to Visit  Medication Sig Dispense Refill   Ascorbic Acid (VITAMIN C PO)  Take 1 tablet by mouth daily.     aspirin EC 81 MG tablet Take 81 mg by mouth daily. Swallow whole.     CALCIUM PO Take 600 mg by mouth in the morning and at bedtime.     cetirizine (ZYRTEC) 5 MG tablet Take 2.5-5 mg by mouth daily.      clonazePAM (KLONOPIN) 0.5 MG tablet Take 0.25-0.5 mg by mouth at bedtime.      ferrous fumarate (HEMOCYTE - 106 MG FE) 325 (106 FE) MG TABS tablet Take 1 tablet by mouth.     ibandronate (BONIVA) 150 MG tablet Take 150 mg by mouth every 30 (thirty) days.     Multiple Vitamins-Minerals (PRESERVISION AREDS 2 PO) Take 1 tablet by mouth daily.     Omega-3 Fatty Acids (FISH OIL CONCENTRATE PO) Take 1 tablet by mouth daily.      simvastatin (ZOCOR) 40 MG tablet TAKE ONE TABLET BY MOUTH EVERY EVENING 90 tablet 1   VITAMIN D, ERGOCALCIFEROL, PO Take 1 tablet by mouth daily.      No current facility-administered medications on file prior to visit.    No Known Allergies  Physical exam:  Today's Vitals   03/15/21 1059  BP: 117/68  Pulse: (!) 50  Weight: 136 lb (61.7 kg)  Height: 5\' 7"  (1.702 m)   Body mass index is 21.3 kg/m.   Wt Readings from Last 3 Encounters:  03/15/21 136 lb (61.7 kg)  01/03/21 136 lb 9.6 oz (62 kg)  12/31/20 136 lb (61.7 kg)     Ht Readings from Last 3 Encounters:  03/15/21 5\' 7"  (1.702 m)  01/03/21 5\' 7"  (1.702 m)  12/31/20 5\' 7"  (1.702 m)      General: The patient is awake, alert and appears not in acute distress. The patient is well groomed. Head: Normocephalic, atraumatic. Neck is supple. Mallampati 1,  neck circumference:13 inches . Nasal airflow patent.   Retrognathia is not seen.  Dental status: crowded.  Cardiovascular:  Regular rate and cardiac rhythm by pulse,  without distended neck veins. Respiratory: Lungs are clear to auscultation.  Skin:  Without evidence of ankle edema, or rash. Trunk: The patient's posture is erect.   Neurologic exam : The patient is awake and alert, oriented to place and time.   Memory subjective described as intact.  Attention span & concentration ability appears normal.  Speech is fluent,  without dysarthria, dysphonia or aphasia.  Mood and affect are appropriate.   Cranial nerves: no loss of smell or taste reported  Pupils are equal and briskly reactive to light. Funduscopic exam deferred. .  Extraocular movements in vertical and horizontal planes were intact and without nystagmus. No Diplopia. Visual fields by finger perimetry are intact. Hearing was intact to soft voice and finger rubbing.    Facial sensation intact to fine touch.  Facial motor strength is symmetric and tongue and uvula move midline.  Neck ROM : rotation, tilt and flexion extension were normal for age and shoulder shrug was symmetrical.    Motor exam:  Symmetric bulk, tone and ROM.   Normal tone without cog wheeling, symmetric grip strength .   Sensory:  Fine touch, pinprick and vibration were tested  and  normal.  Proprioception tested in the upper extremities was normal.   Coordination: Rapid alternating movements in the fingers/hands were of normal speed.  The Finger-to-nose maneuver was intact without evidence of ataxia, dysmetria or tremor.   Gait and station: Patient could rise unassisted from a  seated position, walked without assistive device.  Stance is of normal width/ base and the patient turned with 3 steps.  Toe and heel walk were deferred.  Deep tendon reflexes: in the  upper and lower extremities are symmetric and intact.  Babinski response was deferred .       After spending a total time of  45  minutes face to  face and additional time for physical and neurologic examination, review of laboratory studies,  personal review of imaging studies, reports and results of other testing and review of referral information / records as far as provided in visit, I have established the following assessments:  1) this is classic , chronic insomnia. Mind is racing. Anxiety, OCD .  2) I do not see high risk factors for OSA- sleep apnea, not even snoring.  3) Always bradycardia- sinus. Athletic, skiing, hiking. 4)  sleep disorder gives no correlation to stroke.    My Plan is to proceed with:  1) I wanted by PCP and STROKE MD, I will be happy to order HST.  2) she is doing OK on Clonazepam, getting  average 6 hours.  3) she has implemented al sleep hygiene rules.   Consider cognitive behavior therapy for insomnia, anxiety evaluation, OCD- .   I would like to thank [place], [place], PA and Frann Rider, [address] [place],  [place] [address] for allowing me to meet with and to take care of this pleasant patient.   In short, Jacqueline Bruce is presenting with chronic anxiety, racing mind, not snoring. And strongly feel she needs cognitive behavior therapy or anxiety and OCD evaluation with behavioral health. , I plan to follow up  only if HST is positive- otherwise PCP and referring provider will be asked to send to psychology evaluation.   CC: I will share my notes with PCP and Dr Stroke.   Electronically signed by: Larey Seat, MD 03/15/2021 11:15 AM  [facility] Board certified by The [employer] of Sleep Medicine and Diplomate of the [employer] of Sleep Medicine. Board certified In Neurology through the [employer], Fellow of the [employer] of Neurology. Medical Director of [facility].

## 2021-03-20 ENCOUNTER — Encounter: Payer: Self-pay | Admitting: Neurology

## 2021-03-22 ENCOUNTER — Encounter: Payer: Self-pay | Admitting: Neurology

## 2021-03-23 NOTE — Telephone Encounter (Signed)
Received fax from [facility], they are unable to accept referral due to patient's insurance being OON. I sent the referral to [facility].  Phone: [phone].

## 2021-03-30 ENCOUNTER — Ambulatory Visit (INDEPENDENT_AMBULATORY_CARE_PROVIDER_SITE_OTHER): Payer: Medicare Other | Admitting: Neurology

## 2021-03-30 DIAGNOSIS — R0609 Other forms of dyspnea: Secondary | ICD-10-CM

## 2021-03-30 DIAGNOSIS — F5104 Psychophysiologic insomnia: Secondary | ICD-10-CM

## 2021-03-30 DIAGNOSIS — G4733 Obstructive sleep apnea (adult) (pediatric): Secondary | ICD-10-CM

## 2021-03-30 DIAGNOSIS — F422 Mixed obsessional thoughts and acts: Secondary | ICD-10-CM

## 2021-03-30 DIAGNOSIS — I639 Cerebral infarction, unspecified: Secondary | ICD-10-CM

## 2021-03-31 LAB — CUP PACEART REMOTE DEVICE CHECK
Date Time Interrogation Session: 20221108230701
Implantable Pulse Generator Implant Date: 20210107

## 2021-03-31 NOTE — Progress Notes (Signed)
[facility] TEST REPORT ( by Watch PAT)   STUDY DATE:  04-01-2021   ORDERING CLINICIAN: Larey Seat, MD  REFERRING CLINICIAN: Antony Contras, and McCue NP   CLINICAL INFORMATION/HISTORY: Jacqueline Bruce is a 70 y.o. female stroke patient who was seen on 03/15/2021 upon referral from Dr Leonie Man and Frann Rider NP for a Sleep Medicine consult.  Chief concern according to patient : " I haven't slept well for decades , struggled chronically  with INSOMNIA- but I am not snoring, shared bedroom on trip to [place] with a friend, where there was no report of snoring, no apnea. My problem is not going to sleep, mind is racing. 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  (04/15/2019), Bradycardia with 41-50 beats per minute (04/15/2019), Factor V Leiden mutation ([place]), Osteopenia (04/15/2019), Slurred speech (04/15/2019), and TIA due to embolism ([place]) (04/15/2019). Lost 30 pounds since her stroke 2 years ago.    Epworth sleepiness score: 6/24.   BMI: 21.5 kg/m   Neck Circumference: 13"   FINDINGS:   Sleep Summary:   Total Recording Time (hours, min): The total recording time for this home sleep test amounted to 9 hours 28 minutes.  To my surprise, the total sleep time was 8 hours and 13 minutes.   Of this total sleep time, 16.8% were REM sleep.                                    Respiratory Indices:   Calculated pAHI (per hour): The calculated apnea-hypopnea index per hour of sleep was only 9.8/h and REM sleep 11.9/h in non-REM sleep 9.4/h.  This constitutes mild sleep apnea.                                                      Positional AHI:    The Apnea-Hypopnea index was very much dependent on sleep position.  In supine sleep the patient reached an AHI of 16.8/h and on the right side of only 3.1/h.  Improved sleep  there was no AHI recorded.  The snoring level was low at only 40 dB mean volume with a 6.7% presents during sleep                                      Oxygen Saturation Statistics:  O2 Saturation Range (%): Between a nadir at 86% and a maximum saturation of 99% with a mean oxygen saturation of 95%.  There was no sleep time below 89% saturation recorded.                                 O2 Saturation (minutes) <89%:  0.1 minute        Pulse Rate Statistics:             Pulse Range: Heart rate varied between 38 and 60 bpm.  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  There were very brief oxygen desaturation periods noted these were not amounting to enough total sleep time to justify any intervention.  The patient seems to have a trend to bradycardia.      RECOMMENDATION:Based on this, I would not treat this patient with CPAP but with positional behavioral therapy to encourage her to change from a supine sleep position to left right or prone sleep.  In her revisit with our nurse practitioner she can discuss the tennis ball method with the patient and also the possible help from the [employer]ics sleep balance device which will wake the patient with vibration if the supine position is resumed    INTERPRETING PHYSICIAN:   Larey Seat, MD  Board certified in Sleep Medicine and Neurology  Medical Director of [facility] at [facility].

## 2021-04-04 ENCOUNTER — Ambulatory Visit (INDEPENDENT_AMBULATORY_CARE_PROVIDER_SITE_OTHER): Payer: Medicare Other

## 2021-04-04 DIAGNOSIS — I639 Cerebral infarction, unspecified: Secondary | ICD-10-CM | POA: Diagnosis not present

## 2021-04-06 DIAGNOSIS — Z8673 Personal history of transient ischemic attack (TIA), and cerebral infarction without residual deficits: Secondary | ICD-10-CM | POA: Diagnosis not present

## 2021-04-06 DIAGNOSIS — Z0000 Encounter for general adult medical examination without abnormal findings: Secondary | ICD-10-CM | POA: Diagnosis not present

## 2021-04-06 DIAGNOSIS — M8588 Other specified disorders of bone density and structure, other site: Secondary | ICD-10-CM | POA: Diagnosis not present

## 2021-04-06 DIAGNOSIS — E7800 Pure hypercholesterolemia, unspecified: Secondary | ICD-10-CM | POA: Diagnosis not present

## 2021-04-06 DIAGNOSIS — Z23 Encounter for immunization: Secondary | ICD-10-CM | POA: Diagnosis not present

## 2021-04-06 DIAGNOSIS — G479 Sleep disorder, unspecified: Secondary | ICD-10-CM | POA: Diagnosis not present

## 2021-04-06 DIAGNOSIS — H9190 Unspecified hearing loss, unspecified ear: Secondary | ICD-10-CM | POA: Diagnosis not present

## 2021-04-06 DIAGNOSIS — H3530 Unspecified macular degeneration: Secondary | ICD-10-CM | POA: Diagnosis not present

## 2021-04-11 DIAGNOSIS — F422 Mixed obsessional thoughts and acts: Secondary | ICD-10-CM | POA: Insufficient documentation

## 2021-04-11 NOTE — Procedures (Signed)
[facility] [place] TEST REPORT ( by Watch PAT)   STUDY DATE:  04-01-2021   ORDERING CLINICIAN: Larey Seat, MD  REFERRING CLINICIAN: Antony Contras, and McCue NP   CLINICAL INFORMATION/HISTORY: Jacqueline Bruce is a 70 y.o. female stroke patient who was seen on 03/15/2021 upon referral from Dr Leonie Man and Frann Rider NP for a Sleep Medicine consult.  Chief concern according to patient : " I haven't slept well for decades , struggled chronically  with INSOMNIA- but I am not snoring, shared bedroom on trip to [place] with a friend, where there was no report of snoring, no apnea. My problem is not going to sleep, mind is racing. 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 Lost 30 pounds since her stroke 2 years ago.    Epworth sleepiness score: 6/24.   BMI: 21.5 kg/m   Neck Circumference: 13"   FINDINGS:   Sleep Summary:   Total Recording Time (hours, min): The total recording time for this home sleep test amounted to 9 hours 28 minutes.  To my surprise, the total sleep time was 8 hours and 13 minutes.   Of this total sleep time, 16.8% were REM sleep.                                    Respiratory Indices:   Calculated pAHI (per hour): The calculated apnea-hypopnea index per hour of sleep was only 9.8/h and REM sleep 11.9/h in non-REM sleep 9.4/h.  This constitutes mild sleep apnea.                                                      Positional AHI:    The Apnea-Hypopnea index was very much dependent on sleep position.  In supine sleep the patient reached an AHI of 16.8/h and on the right side of only 3.1/h.  Improved sleep there  was no AHI recorded.  The snoring level was low at only 40 dB mean volume with a 6.7% presents during sleep                                      Oxygen Saturation Statistics:  O2 Saturation Range (%): Between a nadir at 86% and a maximum saturation of 99% with a mean oxygen saturation of 95%.  There was no sleep time below 89% saturation recorded.                                 O2 Saturation (minutes) <89%:  0.1 minute        Pulse Rate Statistics:             Pulse Range: Heart rate varied between 38 and 60 bpm.                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  There were very brief oxygen desaturation periods noted these were not amounting to enough total sleep time to justify any intervention.  The patient seems to have a trend to bradycardia.       RECOMMENDATION:Based on this, I would not treat this patient with CPAP but with positional behavioral therapy to encourage her to change from a supine sleep position to left right or prone sleep.  In her revisit with our nurse practitioner she can discuss the tennis ball method with the patient and also the possible help from the [employer] sleep balance device which will wake the patient with vibration if the supine position is resumed     INTERPRETING PHYSICIAN:    Larey Seat, MD  Board certified in Sleep Medicine and Neurology  Medical Director of [facility] at [facility].

## 2021-04-11 NOTE — Progress Notes (Signed)
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 There were very brief oxygen desaturation periods noted these were not amounting to enough total sleep time to justify any intervention. The patient seems to have a trend to bradycardia.    RECOMMENDATION:Based on this,I would not treat this patient with CPAP but with positional behavioral therapy to encourage her to change from a supine sleep position to left right or prone sleep. In her revisit with her nurse practitioner she can discuss the tennis ball method with the patient  and also the possible help from the [employer] sleep balance device which will wake the patient withvibration if the supine position is resumed   INTERPRETING PHYSICIAN:  Larey Seat, MD  Board certified in Sleep Medicine and Neurology Medical Director of [facility] at [facility]

## 2021-04-12 ENCOUNTER — Encounter: Payer: Self-pay | Admitting: Neurology

## 2021-04-12 NOTE — Progress Notes (Signed)
Carelink Summary Report / Loop Recorder 

## 2021-05-04 LAB — CUP PACEART REMOTE DEVICE CHECK
Date Time Interrogation Session: 20221211231233
Implantable Pulse Generator Implant Date: 20210107

## 2021-05-09 ENCOUNTER — Ambulatory Visit (INDEPENDENT_AMBULATORY_CARE_PROVIDER_SITE_OTHER): Payer: Medicare Other

## 2021-05-09 DIAGNOSIS — I639 Cerebral infarction, unspecified: Secondary | ICD-10-CM

## 2021-05-19 NOTE — Progress Notes (Signed)
Carelink Summary Report / Loop Recorder 

## 2021-06-13 ENCOUNTER — Ambulatory Visit (INDEPENDENT_AMBULATORY_CARE_PROVIDER_SITE_OTHER): Payer: Medicare Other

## 2021-06-13 DIAGNOSIS — I639 Cerebral infarction, unspecified: Secondary | ICD-10-CM | POA: Diagnosis not present

## 2021-06-13 LAB — CUP PACEART REMOTE DEVICE CHECK
Date Time Interrogation Session: 20230122230848
Implantable Pulse Generator Implant Date: 20210107

## 2021-06-24 NOTE — Progress Notes (Signed)
Carelink Summary Report / Loop Recorder 

## 2021-07-17 LAB — CUP PACEART REMOTE DEVICE CHECK
Date Time Interrogation Session: 20230224230859
Implantable Pulse Generator Implant Date: 20210107

## 2021-07-18 ENCOUNTER — Ambulatory Visit (INDEPENDENT_AMBULATORY_CARE_PROVIDER_SITE_OTHER): Payer: Medicare Other

## 2021-07-18 DIAGNOSIS — I639 Cerebral infarction, unspecified: Secondary | ICD-10-CM

## 2021-07-25 DIAGNOSIS — H353132 Nonexudative age-related macular degeneration, bilateral, intermediate dry stage: Secondary | ICD-10-CM | POA: Diagnosis not present

## 2021-07-25 NOTE — Progress Notes (Signed)
Carelink Summary Report / Loop Recorder 

## 2021-08-22 ENCOUNTER — Ambulatory Visit (INDEPENDENT_AMBULATORY_CARE_PROVIDER_SITE_OTHER): Payer: Medicare Other

## 2021-08-22 DIAGNOSIS — I639 Cerebral infarction, unspecified: Secondary | ICD-10-CM | POA: Diagnosis not present

## 2021-08-23 LAB — CUP PACEART REMOTE DEVICE CHECK
Date Time Interrogation Session: 20230331230819
Implantable Pulse Generator Implant Date: 20210107

## 2021-09-06 NOTE — Progress Notes (Signed)
Carelink Summary Report / Loop Recorder 

## 2021-09-22 LAB — CUP PACEART REMOTE DEVICE CHECK
Date Time Interrogation Session: 20230503231523
Implantable Pulse Generator Implant Date: 20210107

## 2021-09-26 ENCOUNTER — Ambulatory Visit (INDEPENDENT_AMBULATORY_CARE_PROVIDER_SITE_OTHER): Payer: Medicare Other

## 2021-09-26 DIAGNOSIS — I639 Cerebral infarction, unspecified: Secondary | ICD-10-CM

## 2021-09-28 DIAGNOSIS — C44519 Basal cell carcinoma of skin of other part of trunk: Secondary | ICD-10-CM | POA: Diagnosis not present

## 2021-09-28 DIAGNOSIS — L718 Other rosacea: Secondary | ICD-10-CM | POA: Diagnosis not present

## 2021-09-28 DIAGNOSIS — D225 Melanocytic nevi of trunk: Secondary | ICD-10-CM | POA: Diagnosis not present

## 2021-10-14 NOTE — Progress Notes (Signed)
Carelink Summary Report / Loop Recorder 

## 2021-10-31 ENCOUNTER — Ambulatory Visit (INDEPENDENT_AMBULATORY_CARE_PROVIDER_SITE_OTHER): Payer: Medicare Other

## 2021-10-31 DIAGNOSIS — I639 Cerebral infarction, unspecified: Secondary | ICD-10-CM

## 2021-11-01 LAB — CUP PACEART REMOTE DEVICE CHECK
Date Time Interrogation Session: 20230605231019
Implantable Pulse Generator Implant Date: 20210107

## 2021-11-16 NOTE — Progress Notes (Signed)
Carelink Summary Report / Loop Recorder 

## 2021-12-01 LAB — CUP PACEART REMOTE DEVICE CHECK
Date Time Interrogation Session: 20230708230558
Implantable Pulse Generator Implant Date: 20210107

## 2021-12-05 ENCOUNTER — Ambulatory Visit: Payer: Medicare Other | Admitting: Podiatry

## 2021-12-05 ENCOUNTER — Ambulatory Visit (INDEPENDENT_AMBULATORY_CARE_PROVIDER_SITE_OTHER): Payer: Medicare Other

## 2021-12-05 DIAGNOSIS — I639 Cerebral infarction, unspecified: Secondary | ICD-10-CM

## 2021-12-05 DIAGNOSIS — M79672 Pain in left foot: Secondary | ICD-10-CM

## 2021-12-05 DIAGNOSIS — D6851 Activated protein C resistance: Secondary | ICD-10-CM

## 2021-12-05 NOTE — Progress Notes (Signed)
Subjective:   Patient ID: Jacqueline Bruce, female   DOB: 71 y.o.   MRN: [id number]   HPI Chief Complaint  Patient presents with   Foot Pain    discuss left foot pin removal from surgery with Dr Amalia Hailey, having a lot of pain from pins on the top of the foot , rate of 8 out of 10, only having pain while wearing shoes, X-Rays done    71 year old female with above history.  She states that she previously bunion surgery and over time the pins become more prominent the left foot and she was have this removed.  Has not been to wear certain shoes given the pressure at that becoming prominent.    History of TIA, was on plavix but she has stopped by her doctor. She is on Aspirin.   She has Factor 5.    Review of Systems  All other systems reviewed and are negative.  Past Medical History:  Diagnosis Date   Acute CVA (cerebrovascular accident) ([place]) 04/16/2019   Allergy    Anxiety 04/15/2019   Bradycardia with 41-50 beats per minute 04/15/2019   Factor V Leiden mutation ([place])    Osteopenia 04/15/2019   Slurred speech 04/15/2019   TIA due to embolism ([place]) 04/15/2019    Past Surgical History:  Procedure Laterality Date   BUBBLE STUDY  04/25/2019   Procedure: BUBBLE STUDY;  Surgeon: Fay Records, MD;  Location: [facility];  Service: Cardiovascular;;   TEE WITHOUT CARDIOVERSION N/A 04/25/2019   Procedure: TRANSESOPHAGEAL ECHOCARDIOGRAM (TEE);  Surgeon: Fay Records, MD;  [facility];  Service: Cardiovascular;  Laterality: N/A;     Current Outpatient Medications:    clonazePAM (KLONOPIN) 1 MG tablet, 1/2 to 1 tablet, Disp: , Rfl:    Ascorbic Acid (VITAMIN C PO), Take 1 tablet by mouth daily., Disp: , Rfl:    aspirin (ASPIRIN 81) 81 MG chewable tablet, 1 tablet, Disp: , Rfl:    aspirin EC 81 MG tablet, Take 81 mg by mouth daily. Swallow whole., Disp: , Rfl:    Calcium Carb-Cholecalciferol 600-20 MG-MCG TABS, 1 tablet with a meal, Disp: , Rfl:    CALCIUM PO, Take 600 mg by  mouth in the morning and at bedtime., Disp: , Rfl:    cetirizine (ZYRTEC ALLERGY) 10 MG tablet, 1/2 tablet, Disp: , Rfl:    cetirizine (ZYRTEC) 5 MG tablet, Take 2.5-5 mg by mouth daily. , Disp: , Rfl:    clonazePAM (KLONOPIN) 0.5 MG tablet, Take 0.25-0.5 mg by mouth at bedtime. , Disp: , Rfl:    D 1000 25 MCG (1000 UT) capsule, SMARTSIG:1 By Mouth, Disp: , Rfl:    EFUDEX 5 % cream, Apply topically., Disp: , Rfl:    ferrous fumarate (HEMOCYTE - 106 MG FE) 325 (106 FE) MG TABS tablet, Take 1 tablet by mouth., Disp: , Rfl:    Ferrous Sulfate (IRON) 90 (18 Fe) MG TABS, SMARTSIG:1 Tablet(s) By Mouth, Disp: , Rfl:    GNP IRON 200 (65 Fe) MG TABS, , Disp: , Rfl:    ibandronate (BONIVA) 150 MG tablet, Take 150 mg by mouth every 30 (thirty) days., Disp: , Rfl:    ketorolac (ACULAR) 0.5 % ophthalmic solution, SMARTSIG:In Eye(s), Disp: , Rfl:    Multiple Vitamins-Minerals ([employer]S VITAMIN C) PACK, SMARTSIG:1 By Mouth, Disp: , Rfl:    Multiple Vitamins-Minerals (PRESERVISION AREDS 2 PO), Take 1 tablet by mouth daily., Disp: , Rfl:    Multiple Vitamins-Minerals (PRESERVISION AREDS 2) CHEW,  See admin instructions., Disp: , Rfl:    Omega-3 Fatty Acids (FISH OIL CONCENTRATE PO), Take 1 tablet by mouth daily. , Disp: , Rfl:    PURE CALCIUM CARBONATE 1500 (600 Ca) MG TABS tablet, , Disp: , Rfl:    simvastatin (ZOCOR) 40 MG tablet, TAKE ONE TABLET BY MOUTH EVERY EVENING, Disp: 90 tablet, Rfl: 1   simvastatin (ZOCOR) 40 MG tablet, Take 1 tablet by mouth every evening., Disp: , Rfl:    vitamin C (ASCORBIC ACID) 500 MG tablet, 1 tablet, Disp: , Rfl:    VITAMIN D, ERGOCALCIFEROL, PO, Take 1 tablet by mouth daily. , Disp: , Rfl:    XANAX 0.5 MG tablet, SMARTSIG:1 By Mouth, Disp: , Rfl:   No Known Allergies        Objective:  Physical Exam  General: AAO x3, NAD  Dermatological: Skin is warm, dry and supple bilateral.  Scars from the prior surgery are all well-healed.  There are no open sores, no preulcerative  lesions, no rash or signs of infection present.  Vascular: Dorsalis Pedis artery and Posterior Tibial artery pedal pulses are 2/4 bilateral with immedate capillary fill time. There is no pain with calf compression, swelling, warmth, erythema.   Neruologic: Grossly intact via light touch bilateral.   Musculoskeletal: Prominence of the hardware in the left foot with tenderness palpation of this area.  No other areas of pinpoint tenderness.  There is no evidence of prominent hardware to the second metatarsal on the left foot or the area on the right foot is very minimal along the area of the pins but not causing any pain.  Muscular strength 5/5 in all groups tested bilateral.  Gait: Unassisted, Nonantalgic.       Assessment:   Prominent hardware left foot     Plan:  -Treatment options discussed including all alternatives, risks, and complications. -Etiology of symptoms were discussed -X-rays were obtained and reviewed with the patient.  3 views left foot were obtained.  Hardware intact any complicating factors.  No evidence of acute fracture. -We discussed both conservative as well as surgical treatment options.  As the hardware is prominent and causing irritation I do recommend hardware removal and she wants to proceed with this as well. -The incision placement as well as the postoperative course was discussed with the patient. I discussed risks of the surgery which include, but not limited to, infection, bleeding, pain, swelling, need for further surgery, delayed or nonhealing, painful or ugly scar, numbness or sensation changes, over/under correction, recurrence, transfer lesions, further deformity, hardware failure, DVT/PE, loss of toe/foot. Patient understands these risks and wishes to proceed with surgery. The surgical consent was reviewed with the patient all 3 pages were signed. No promises or guarantees were given to the outcome of the procedure. All questions were answered to the best of  my ability. Before the surgery the patient was encouraged to call the office if there is any further questions. The surgery will be performed at the [facility] on an outpatient basis.  *has Factor 5, on ASA.    Trula Slade DPM

## 2021-12-05 NOTE — Patient Instructions (Signed)

## 2021-12-12 ENCOUNTER — Telehealth: Payer: Self-pay | Admitting: *Deleted

## 2021-12-12 ENCOUNTER — Telehealth: Payer: Self-pay

## 2021-12-12 ENCOUNTER — Encounter: Payer: Self-pay | Admitting: Podiatry

## 2021-12-12 ENCOUNTER — Telehealth: Payer: Self-pay | Admitting: Internal Medicine

## 2021-12-12 NOTE — Telephone Encounter (Signed)
   Pre-operative Risk Assessment    Patient Name: Jacqueline Bruce  DOB: 1951/02/16 MRN: [id number]      Request for Surgical Clearance    Procedure:    Removal of pins from her left foot  Date of Surgery:  Clearance 12/28/21                                 Surgeon:    Dr. Mayo Ao Surgeon's Group or Practice Name:       [facility] Phone number:  [phone] Fax number:  [phone]   Type of Clearance Requested:   - Medical    Type of Anesthesia:  Not known   Additional requests/questions:     Caller looking for clearance for this procedure.     Signed, Heloise Beecham   12/12/2021, 9:37 AM

## 2021-12-12 NOTE — Telephone Encounter (Signed)
   Name: Jacqueline Bruce  DOB: 01-28-1951  MRN: [id number]  Primary Cardiologist: None   Preoperative team, please contact this patient and set up a phone call appointment for further preoperative risk assessment. Please obtain consent and complete medication review. Thank you for your help.  I confirm that guidance regarding antiplatelet and oral anticoagulation therapy has been completed and, if necessary, noted below.  Request is for medical clearance only.  No request for medication hold noted.  [name], NP 12/12/2021, 12:35 PM [place] [address] [place], [place] [address]

## 2021-12-12 NOTE — Telephone Encounter (Signed)
Pt agreeable to plan of care for tele pre op appt 12/16/21 @ 9: 20 per pt request for time slot. Med rec and cosent are done.

## 2021-12-12 NOTE — Telephone Encounter (Signed)
-----   Message from Michae Kava, [place] sent at 12/12/2021  1:46 PM EDT ----- Regarding: pt has questions Hi everyone,   Pt has questions about her link report. Pt states she saw that report said ok, but then saw something that said aggressive?   I assured her that someone in device will call her.   Thank you  Arbie Cookey

## 2021-12-12 NOTE — Telephone Encounter (Signed)
Pt agreeable to plan of care for tele pre op appt 12/16/21 @ 9: 20 per pt request for time slot. Med rec and cosent are done.     Patient Consent for Virtual Visit        Jacqueline Bruce has provided verbal consent on 12/12/2021 for a virtual visit (video or telephone).   CONSENT FOR VIRTUAL VISIT FOR:  Jacqueline Bruce  By participating in this virtual visit I agree to the following:  I hereby voluntarily request, consent and authorize [facility] and its employed or contracted physicians, physician assistants, nurse practitioners or other licensed health care professionals (the Practitioner), to provide me with telemedicine health care services (the "Services") as deemed necessary by the treating Practitioner. I acknowledge and consent to receive the Services by the Practitioner via telemedicine. I understand that the telemedicine visit will involve communicating with the Practitioner through live audiovisual communication technology and the disclosure of certain medical information by electronic transmission. I acknowledge that I have been given the opportunity to request an in-person assessment or other available alternative prior to the telemedicine visit and am voluntarily participating in the telemedicine visit.  I understand that I have the right to withhold or withdraw my consent to the use of telemedicine in the course of my care at any time, without affecting my right to future care or treatment, and that the Practitioner or I may terminate the telemedicine visit at any time. I understand that I have the right to inspect all information obtained and/or recorded in the course of the telemedicine visit and may receive copies of available information for a reasonable fee.  I understand that some of the potential risks of receiving the Services via telemedicine include:  Delay or interruption in medical evaluation due to technological equipment failure or disruption; Information  transmitted may not be sufficient (e.g. poor resolution of images) to allow for appropriate medical decision making by the Practitioner; and/or  In rare instances, security protocols could fail, causing a breach of personal health information.  Furthermore, I acknowledge that it is my responsibility to provide information about my medical history, conditions and care that is complete and accurate to the best of my ability. I acknowledge that Practitioner's advice, recommendations, and/or decision may be based on factors not within their control, such as incomplete or inaccurate data provided by me or distortions of diagnostic images or specimens that may result from electronic transmissions. I understand that the practice of medicine is not an exact science and that Practitioner makes no warranties or guarantees regarding treatment outcomes. I acknowledge that a copy of this consent can be made available to me via my patient portal ([facility]), or I can request a printed copy by calling the office of [facility].    I understand that my insurance will be billed for this visit.   I have read or had this consent read to me. I understand the contents of this consent, which adequately explains the benefits and risks of the Services being provided via telemedicine.  I have been provided ample opportunity to ask questions regarding this consent and the Services and have had my questions answered to my satisfaction. I give my informed consent for the services to be provided through the use of telemedicine in my medical care

## 2021-12-12 NOTE — Telephone Encounter (Signed)
Spoke with patient informed her that the word "aggressive" on the the PDF is in regards to the device seeing non-physiologic signals such as patient scratching around device site informed patient that it took this nurse 6 months to learn how to read these reports as if anything abnormal showed up on the transmission would be in contact patient voiced understanding

## 2021-12-13 ENCOUNTER — Telehealth: Payer: Self-pay | Admitting: Urology

## 2021-12-13 ENCOUNTER — Telehealth: Payer: Self-pay

## 2021-12-13 NOTE — Telephone Encounter (Signed)
DOS - 12/28/21  REMOVAL FIXATION DEEP LEFT --- 20680  [employer] EFFECTIVE DATE - 05/22/21  PLAN DEDUCTIBLE - $0.00 OUT OF POCKET - $3,600.00 W/ $100.00 REMAINING COINSURANCE - 0% COPAY - $295.00   PER [employer] WEB SITE FOR CPT CODE 75449 Notification or Prior Authorization is not required for the requested services  Decision ID #:[id number]

## 2021-12-13 NOTE — Telephone Encounter (Signed)
Received surgical clearance form for this pt. Last visit with JM, NP was 01/03/2021. Unsure we can complete since it has been almost a year. Will have NP review.

## 2021-12-13 NOTE — Telephone Encounter (Signed)
Can a virtual visit (either video or telephone) be scheduled for patient just to ensure she has been doing okay over the pat year and no new stroke/TIA symptoms as well as discuss precautions with holding blood thinner if needed. Thank you!

## 2021-12-13 NOTE — Telephone Encounter (Signed)
Sent mychart message following up on this.

## 2021-12-14 ENCOUNTER — Encounter: Payer: Self-pay | Admitting: Adult Health

## 2021-12-14 ENCOUNTER — Ambulatory Visit (INDEPENDENT_AMBULATORY_CARE_PROVIDER_SITE_OTHER): Payer: Medicare Other | Admitting: Adult Health

## 2021-12-14 ENCOUNTER — Encounter: Payer: Self-pay | Admitting: *Deleted

## 2021-12-14 DIAGNOSIS — I639 Cerebral infarction, unspecified: Secondary | ICD-10-CM

## 2021-12-14 DIAGNOSIS — Z01818 Encounter for other preprocedural examination: Secondary | ICD-10-CM

## 2021-12-14 NOTE — Progress Notes (Signed)
[facility] [address] [place]. [place] [address] [phone]       STROKE FOLLOW-UP NOTE  [name] Date of Birth:  April 26, 1951 Medical Record Number:  [id number]    Reason for visit: Stroke follow-up Primary neurologist: Dr. Leonie Man    I connected with  Jacqueline Bruce on 12/14/21 by telephone and verified that I am speaking with the correct person using two identifiers.    I discussed the limitations of evaluation and management by telemedicine. The patient expressed understanding and agreed to proceed.   Patient located at home.   Provider located in office     HPI:   Update 12/14/2021 JM: Patient returns for surgical clearance via telephone visit.  She was previously seen approximately 1 year ago and doing well at that time without any residual stroke deficits.  Reports she has continued to do well since that time without any new or reoccurring stroke/TIA symptoms.  She is requesting clearance for left foot hardware removal under general anesthesia by Dr. Jacqualyn Posey at [facility].  Reports compliance on aspirin and simvastatin, denies side effects.  Loop recorder routinely monitored by cardiology which has not shown atrial fibrillation thus far.  She was evaluated by Dr. Brett Fairy 02/2021 with completion of HST which showed mild sleep apnea strictly positional therefore CPAP treatment not required and discussed avoidance of supine sleep.  She is routinely monitored by PCP Dr. Barrie Folk.  She is scheduled for cardiology clearance 7/28.  No further concerns at this time     History provided for reference purposes only Update 01/03/2021 JM: Jacqueline Bruce returns for 1 year stroke follow-up unaccompanied.  Overall stable.  No new or reoccurring stroke/TIA symptoms.  Compliant on aspirin and simvastatin -denies side effects.  Blood pressure today 118/76.  Loop recorder has not shown atrial fibrillation thus far. Occasional dyspnea on exertion  worse with humidity over the past several years - extensive evaluation with pulmonology which was all unremarkable.  Remains very active going to the gym routinely but has been limited with outdoor activity over the summer.  Does report chronic history of insomnia with long-term use of clonazepam.  She also reports daytime fatigue with grogginess upon awakening.  Denies snoring or witnessed apneas. Has not previously had sleep study. No further concerns at this time.  Initial visit 01/27/2020 Dr. Leonie Man: Jacqueline Bruce is a pleasant 71 year old Caucasian lady seen today for initial office follow-up visit following stroke in November 2020.  History is obtained from her, review of electronic medical records as well as research study notes and personal review of imaging films in PACS.  She is a pleasant 71 year old lady with no prior past medical history except allergies.  She presented on 04/15/2019 with sudden onset of difficulty expressing herself while at [facility].  EMS were called during transport her symptoms rapidly improved.  While in the ER she also had transient 1 or 2 episodes of word finding difficulties.  CT scan of the head was obtained which was unremarkable but subsequently MRI scan of the brain showed tiny punctate acute left parietal cortical infarct.  CT angiogram of the head and neck showed only mild atherosclerosis involving left carotid siphon with a 1 to 2 mm outpouching in the cavernous carotid possibly in front balloon.  There was calcified aortic sclerosis noted.  2D echo showed normal ejection fraction TEE and subsequently as an outpatient showed no cardiac source of embolism.  Patient had loop recorder inserted  and so for paroxysmal A. fib has not yet been found.  LDL cholesterol 118 mg percent and hemoglobin A1c was 5.5.  Patient participated in the  [employer] axiomatic stroke prevention trial ( Factor 11 a inhibitor versus placebo) and completed participation.  She was placed on Plavix after that  but call the office complaining of significant bruising.  After Plavix was stopped losing improved and we have switched her back to aspirin 81 mg which she is tolerating much better without recurrent bruising.  She is starting Zocor well as well without muscle aches and pains.  Blood pressures well controlled today it is 135/87.  She is exercising regularly and goes to gym 2 days a week and walks 2 miles a day.  She is eating healthy.  She is lost weight.  She has had no recurrent stroke or TIA symptoms.  She has not had any follow-up lipid profile carotid ultrasound checked yet.     ROS:   14 system review of systems is positive for those listed in HPI and all other systems negative    PMH:  Past Medical History:  Diagnosis Date   Acute CVA (cerebrovascular accident) ([place]) 04/16/2019   Allergy    Anxiety 04/15/2019   Bradycardia with 41-50 beats per minute 04/15/2019   Factor V Leiden mutation ([place])    Osteopenia 04/15/2019   Slurred speech 04/15/2019   TIA due to embolism ([place]) 04/15/2019    Social History:  Social History   Socioeconomic History   Marital status: Married    Spouse name: Not on file   Number of children: Not on file   Years of education: Not on file   Highest education level: Not on file  Occupational History   Not on file  Tobacco Use   Smoking status: Never   Smokeless tobacco: Never  Vaping Use   Vaping Use: Never used  Substance and Sexual Activity   Alcohol use: Yes    Comment: about 3 oz. some nights   Drug use: No   Sexual activity: Not on file  Other Topics Concern   Not on file  Social History Narrative   Lives with husband    Right handed   Caffeine: 1 cup of coffee a day. Diet coke qotherd    Social Determinants of Radio broadcast assistant Strain: Not on file  Food Insecurity: Not on file  Transportation Needs: Not on file  Physical Activity: Not on file  Stress: Not on file  Social Connections: Not on file  Intimate Partner  Violence: Not on file    Medications:   Current Outpatient Medications on File Prior to Visit  Medication Sig Dispense Refill   Ascorbic Acid (VITAMIN C PO) Take 1 tablet by mouth daily. (Patient not taking: Reported on 12/12/2021)     aspirin (ASPIRIN 81) 81 MG chewable tablet 1 tablet (Patient not taking: Reported on 12/12/2021)     aspirin EC 81 MG tablet Take 81 mg by mouth daily. Swallow whole.     Calcium Carb-Cholecalciferol 600-20 MG-MCG TABS 1 tablet with a meal (Patient not taking: Reported on 12/12/2021)     CALCIUM PO Take 600 mg by mouth in the morning and at bedtime.     cetirizine (ZYRTEC ALLERGY) 10 MG tablet 1/2 tablet     cetirizine (ZYRTEC) 5 MG tablet Take 2.5-5 mg by mouth daily.  (Patient not taking: Reported on 12/12/2021)     clonazePAM (KLONOPIN) 0.5 MG tablet Take 0.5 mg by  mouth at bedtime. TAKES BETWEEN 1/2 AND 1 TAB DAILY AT BEDTIME     clonazePAM (KLONOPIN) 1 MG tablet 1/2 to 1 tablet (Patient not taking: Reported on 12/12/2021)     D 1000 25 MCG (1000 UT) capsule SMARTSIG:1 By Mouth (Patient not taking: Reported on 12/12/2021)     EFUDEX 5 % cream Apply topically. USES AS PRN     ferrous fumarate (HEMOCYTE - 106 MG FE) 325 (106 FE) MG TABS tablet Take 1 tablet by mouth. (Patient not taking: Reported on 12/12/2021)     Ferrous Sulfate (IRON) 90 (18 Fe) MG TABS SMARTSIG:1 Tablet(s) By Mouth (Patient not taking: Reported on 12/12/2021)     GNP IRON 200 (65 Fe) MG TABS      ibandronate (BONIVA) 150 MG tablet Take 150 mg by mouth every 30 (thirty) days.     ketorolac (ACULAR) 0.5 % ophthalmic solution Place into both eyes. USES AS PRN     Multiple Vitamins-Minerals ([employer] VITAMIN C) PACK SMARTSIG:1 By Mouth (Patient not taking: Reported on 12/12/2021)     Multiple Vitamins-Minerals (PRESERVISION AREDS 2 PO) Take 1 tablet by mouth daily.     Multiple Vitamins-Minerals (PRESERVISION AREDS 2) CHEW See admin instructions. (Patient not taking: Reported on 12/12/2021)     Omega-3  Fatty Acids (FISH OIL CONCENTRATE PO) Take 1 tablet by mouth daily.      PURE CALCIUM CARBONATE 1500 (600 Ca) MG TABS tablet  (Patient not taking: Reported on 12/12/2021)     simvastatin (ZOCOR) 40 MG tablet TAKE ONE TABLET BY MOUTH EVERY EVENING (Patient not taking: Reported on 12/12/2021) 90 tablet 1   simvastatin (ZOCOR) 40 MG tablet Take 1 tablet by mouth every evening.     vitamin C (ASCORBIC ACID) 500 MG tablet 1 tablet     VITAMIN D, ERGOCALCIFEROL, PO Take 1 tablet by mouth daily. TAKES 1000 IU TAKES BID     XANAX 0.5 MG tablet SMARTSIG:1 By Mouth (Patient not taking: Reported on 12/12/2021)     No current facility-administered medications on file prior to visit.    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  Patient participated in the [place] stroke trial and has completed participation.  Vascular risk factors of hyperlipidemia only.  Returns today, 12/14/2021, for surgical clearance undergo left foot hardware removal under general anesthesia    PLAN: -Discussed surgical clearance.  As she has been stable from stroke standpoint since 03/2019 without any new or reoccurring stroke/TIA symptoms, she is cleared to pursue procedure from neurological standpoint. If indicated, can hold aspirin for 3 to 5 days prior to procedure and restart immediately or once hemodynamically stable. She was found to have mild sleep apnea in supine position only, would recommend avoidance of supine position if able while under general anesthesia  For secondary stroke prevention measures: -Continue aspirin 81 mg daily and simvastatin 40 mg daily -Cardiology will continue to monitor loop recorder which has not shown atrial fibrillation thus far -Routine follow-up with PCP to maintain strict control of lipids with  LDL <70       CC:  Garvin Fila, MD   I spent a total of 13 minutes (8 minutes actively on telephone and 5 minutes charting) non-face-to-face time with patient via telephone.  This included previsit chart review, lab review, study review, electronic  health record documentation, patient education and discussion regarding request for surgical clearance with risk versus benefit of pursuing an indication for holding medication in setting of prior stroke history, current medication list review and answered all other questions to patient satisfaction   Frann Rider, [facility]  [facility] [address] [place], [place] [address]  Phone [phone] Fax [phone] Note: This document was prepared with digital dictation and possible smart phrase technology. Any transcriptional errors that result from this process are unintentional.

## 2021-12-14 NOTE — Telephone Encounter (Signed)
NP completed telephone visit with patient today. Surgical clearance letter written and signed by NP, faxed to [facility], Dr Jacqualyn Posey. Received confirmation.

## 2021-12-14 NOTE — Telephone Encounter (Signed)
Spoke with pt, scheduled telephone visit today with Janett Billow at 12:45 pm. Verified best phone # for pt as [phone].

## 2021-12-16 ENCOUNTER — Other Ambulatory Visit: Payer: Self-pay | Admitting: Podiatry

## 2021-12-16 ENCOUNTER — Ambulatory Visit (INDEPENDENT_AMBULATORY_CARE_PROVIDER_SITE_OTHER): Payer: Medicare Other | Admitting: Nurse Practitioner

## 2021-12-16 DIAGNOSIS — D6851 Activated protein C resistance: Secondary | ICD-10-CM

## 2021-12-16 DIAGNOSIS — Z01810 Encounter for preprocedural cardiovascular examination: Secondary | ICD-10-CM | POA: Diagnosis not present

## 2021-12-16 NOTE — Progress Notes (Signed)
Virtual Visit via Telephone Note   Because of Jacqueline Bruce's co-morbid illnesses, she is at least at moderate risk for complications without adequate follow up.  This format is felt to be most appropriate for this patient at this time.  The patient did not have access to video technology/had technical difficulties with video requiring transitioning to audio format only (telephone).  All issues noted in this document were discussed and addressed.  No physical exam could be performed with this format.  Please refer to the patient's chart for her consent to telehealth for [facility].  Evaluation Performed:  Preoperative cardiovascular risk assessment _____________   Date:  12/16/2021   Patient ID:  [name], DOB Oct 09, 1950, MRN [id number] Patient Location:  Home Provider location:   Office  Primary Care Provider:  Garvin Fila, MD Primary Cardiologist:  Lovena Le (EP)  Chief Complaint / Patient Profile   71 y.o. y/o female with a h/o cryptogenic stroke s/p ILR, bradycardia, factor V Leiden mutation, osteopenia, and anxiety who is pending removal of pins from left foot on 12/28/2021 with Dr. Mayo Ao of [place] [facility] [place] and presents today for telephonic preoperative cardiovascular risk assessment.  Past Medical History    Past Medical History:  Diagnosis Date   Acute CVA (cerebrovascular accident) ([place]) 04/16/2019   Allergy    Anxiety 04/15/2019   Bradycardia with 41-50 beats per minute 04/15/2019   Factor V Leiden mutation ([place])    Osteopenia 04/15/2019   Slurred speech 04/15/2019   TIA due to embolism ([place]) 04/15/2019   Past Surgical History:  Procedure Laterality Date   BUBBLE STUDY  04/25/2019   Procedure: BUBBLE STUDY;  Surgeon: Fay Records, MD;  Location: [place];  Service: Cardiovascular;;   TEE WITHOUT CARDIOVERSION N/A 04/25/2019   Procedure: TRANSESOPHAGEAL ECHOCARDIOGRAM (TEE);  Surgeon: Fay Records, MD;  Location[facility] ENDOSCOPY;   Service: Cardiovascular;  Laterality: N/A;    Allergies  No Known Allergies  History of Present Illness    Jacqueline Bruce is a 71 y.o. female who presents via audio/video conferencing for a telehealth visit today. Pt was last seen in cardiology clinic on 12/31/2020 by Dr. Lovena Le. At that time Jacqueline Bruce was doing well. The patient is now pending procedure as outlined above. Since her last visit, she done well from a cardiac standpoint. She denies chest pain, palpitations, dyspnea, pnd, orthopnea, n, v, dizziness, syncope, edema, weight gain, or early satiety. All other systems reviewed and are otherwise negative except as noted above.   Home Medications    Prior to Admission medications   Medication Sig Start Date End Date Taking? Authorizing Provider  aspirin EC 81 MG tablet Take 81 mg by mouth daily. Swallow whole.    [provider]  CALCIUM PO Take 600 mg by mouth in the morning and at bedtime.    [provider]  cetirizine (ZYRTEC ALLERGY) 10 MG tablet 1/2 tablet    [provider]  clonazePAM (KLONOPIN) 0.5 MG tablet Take 0.5 mg by mouth at bedtime. TAKES BETWEEN 1/2 AND 1 TAB DAILY AT BEDTIME 04/11/19   [provider]  EFUDEX 5 % cream Apply topically. USES AS PRN 09/28/21   [provider]  GNP IRON 200 (65 Fe) MG TABS  10/03/21   [provider]  ibandronate (BONIVA) 150 MG tablet Take 150 mg by mouth every 30 (thirty) days. 11/25/18   [provider]  ketorolac (ACULAR) 0.5 % ophthalmic  solution Place into both eyes. USES AS PRN 08/25/21   [provider]  Multiple Vitamins-Minerals (PRESERVISION AREDS 2 PO) Take 1 tablet by mouth daily.    [provider]  Omega-3 Fatty Acids (FISH OIL CONCENTRATE PO) Take 1 tablet by mouth daily.     [provider]  simvastatin (ZOCOR) 40 MG tablet Take 1 tablet by mouth every evening.    [provider]  vitamin C (ASCORBIC ACID) 500 MG tablet  1 tablet    [provider]  VITAMIN D, ERGOCALCIFEROL, PO Take 1 tablet by mouth daily. TAKES 1000 IU TAKES BID    [provider]    Physical Exam    Vital Signs:  EARLY ORD does not have vital signs available for review today.  Given telephonic nature of communication, physical exam is limited. AAOx3. NAD. Normal affect.  Speech and respirations are unlabored.  Accessory Clinical Findings    None  Assessment & Plan    1.  Preoperative Cardiovascular Risk Assessment:  According to the Revised Cardiac Risk Index (RCRI), her Perioperative Risk of Major Cardiac Event is (%): 0.9. Her Functional Capacity in METs is: 8.97 according to the [employer] Activity Status Index (DASI). Therefore, based on ACC/AHA guidelines, patient would be at acceptable risk for the planned procedure without further cardiovascular testing.   A copy of this note will be routed to requesting surgeon.  Time:   Today, I have spent 9 minutes with the patient with telehealth technology discussing medical history, symptoms, and management plan.     [name], NP  12/16/2021, 10:47 AM

## 2021-12-28 ENCOUNTER — Encounter: Payer: Self-pay | Admitting: Podiatry

## 2021-12-28 ENCOUNTER — Other Ambulatory Visit: Payer: Self-pay | Admitting: Podiatry

## 2021-12-28 DIAGNOSIS — Z472 Encounter for removal of internal fixation device: Secondary | ICD-10-CM | POA: Diagnosis not present

## 2021-12-28 DIAGNOSIS — T8484XA Pain due to internal orthopedic prosthetic devices, implants and grafts, initial encounter: Secondary | ICD-10-CM | POA: Diagnosis not present

## 2021-12-28 DIAGNOSIS — Z4889 Encounter for other specified surgical aftercare: Secondary | ICD-10-CM | POA: Diagnosis not present

## 2021-12-28 MED ORDER — TRAMADOL HCL 50 MG PO TABS: 50.0000 mg | ORAL_TABLET | Freq: Three times a day (TID) | ORAL | 0 refills | Status: AC | PRN

## 2021-12-28 MED ORDER — CEPHALEXIN 500 MG PO CAPS: 500.0000 mg | ORAL_CAPSULE | Freq: Three times a day (TID) | ORAL | 0 refills | Status: DC

## 2021-12-28 NOTE — Progress Notes (Signed)
Postop medications sent 

## 2022-01-02 ENCOUNTER — Ambulatory Visit (INDEPENDENT_AMBULATORY_CARE_PROVIDER_SITE_OTHER): Payer: Medicare Other | Admitting: Podiatry

## 2022-01-02 ENCOUNTER — Encounter: Payer: Medicare Other | Admitting: Podiatry

## 2022-01-02 ENCOUNTER — Ambulatory Visit (INDEPENDENT_AMBULATORY_CARE_PROVIDER_SITE_OTHER): Payer: Medicare Other

## 2022-01-02 DIAGNOSIS — Z09 Encounter for follow-up examination after completed treatment for conditions other than malignant neoplasm: Secondary | ICD-10-CM

## 2022-01-02 NOTE — Progress Notes (Signed)
Patient seen today at the office for POV #1 DOS 12/28/2021 left foot removal of pins prior bunion surgery. Patient denies any fever, nausea, vomiting, chills or chest pain at this time. X-ray was obtained and reviewed by provider.   Dressing was removed. Sutures intact. Patient stated she has some pain to the arch of her left foot and lateral aspect of left foot.   Provider assessed incision site. Provider advise patient to keep area dry and clean until Wednesday and on Wednesday patient can clean left foot and cover incision area with a band-aid.   Nurse covered incision site with an non-adhesive dressing, followed by gauze wrap, secured with coban and stockinet.   Patient will return to clinic next week for possible suture removal.

## 2022-01-05 NOTE — Progress Notes (Signed)
Carelink Summary Report / Loop Recorder 

## 2022-01-09 ENCOUNTER — Ambulatory Visit (INDEPENDENT_AMBULATORY_CARE_PROVIDER_SITE_OTHER): Payer: Medicare Other

## 2022-01-09 DIAGNOSIS — I639 Cerebral infarction, unspecified: Secondary | ICD-10-CM

## 2022-01-10 LAB — CUP PACEART REMOTE DEVICE CHECK
Date Time Interrogation Session: 20230820231547
Implantable Pulse Generator Implant Date: 20210107

## 2022-01-11 ENCOUNTER — Ambulatory Visit (INDEPENDENT_AMBULATORY_CARE_PROVIDER_SITE_OTHER): Payer: Medicare Other | Admitting: Podiatry

## 2022-01-11 DIAGNOSIS — Z09 Encounter for follow-up examination after completed treatment for conditions other than malignant neoplasm: Secondary | ICD-10-CM

## 2022-01-11 NOTE — Progress Notes (Signed)
Patient seen today for POV#2 DOS 12/28/2021 Left foot removal of pins from prior bunion surgery.  Patient denies any fever, nausea, vomiting, chills or chest pain.   Sutures were removed without any complications. Patient tolerated procedure. Advise patient to keep foot clean and dry.   Advise patient to call office with any concerns or if there are any signs or symptoms of infection to go to the ED.

## 2022-01-12 ENCOUNTER — Other Ambulatory Visit: Payer: Medicare Other

## 2022-01-12 ENCOUNTER — Encounter: Payer: Medicare Other | Admitting: Podiatry

## 2022-01-26 ENCOUNTER — Ambulatory Visit (INDEPENDENT_AMBULATORY_CARE_PROVIDER_SITE_OTHER): Payer: Medicare Other | Admitting: Podiatry

## 2022-01-26 ENCOUNTER — Ambulatory Visit (INDEPENDENT_AMBULATORY_CARE_PROVIDER_SITE_OTHER): Payer: Medicare Other

## 2022-01-26 DIAGNOSIS — Z09 Encounter for follow-up examination after completed treatment for conditions other than malignant neoplasm: Secondary | ICD-10-CM | POA: Diagnosis not present

## 2022-01-26 DIAGNOSIS — Z9889 Other specified postprocedural states: Secondary | ICD-10-CM

## 2022-01-26 NOTE — Progress Notes (Unsigned)
moviable

## 2022-01-26 NOTE — Patient Instructions (Addendum)
Once the wound has completely healed you can use VOLTAREN GEL on the area For now keep a small amount of antibiotic ointment and bandage on the area during the day.  Monitor for any clinical signs or symptoms of infection and directed to call the office immediately should any occur or go to the ER.

## 2022-02-06 NOTE — Progress Notes (Signed)
Carelink Summary Report / Loop Recorder 

## 2022-02-13 ENCOUNTER — Ambulatory Visit (INDEPENDENT_AMBULATORY_CARE_PROVIDER_SITE_OTHER): Payer: Medicare Other

## 2022-02-13 DIAGNOSIS — I639 Cerebral infarction, unspecified: Secondary | ICD-10-CM

## 2022-02-14 LAB — CUP PACEART REMOTE DEVICE CHECK
Date Time Interrogation Session: 20230922230343
Implantable Pulse Generator Implant Date: 20210107

## 2022-02-27 DIAGNOSIS — H353132 Nonexudative age-related macular degeneration, bilateral, intermediate dry stage: Secondary | ICD-10-CM | POA: Diagnosis not present

## 2022-02-28 DIAGNOSIS — H5310 Unspecified subjective visual disturbances: Secondary | ICD-10-CM | POA: Diagnosis not present

## 2022-02-28 NOTE — Progress Notes (Signed)
Carelink Summary Report / Loop Recorder 

## 2022-03-06 ENCOUNTER — Ambulatory Visit (INDEPENDENT_AMBULATORY_CARE_PROVIDER_SITE_OTHER): Payer: Medicare Other | Admitting: Podiatry

## 2022-03-06 DIAGNOSIS — H5319 Other subjective visual disturbances: Secondary | ICD-10-CM | POA: Diagnosis not present

## 2022-03-06 DIAGNOSIS — H43823 Vitreomacular adhesion, bilateral: Secondary | ICD-10-CM | POA: Diagnosis not present

## 2022-03-06 DIAGNOSIS — H353132 Nonexudative age-related macular degeneration, bilateral, intermediate dry stage: Secondary | ICD-10-CM | POA: Diagnosis not present

## 2022-03-06 DIAGNOSIS — M7989 Other specified soft tissue disorders: Secondary | ICD-10-CM

## 2022-03-06 MED ORDER — DEXAMETHASONE SODIUM PHOSPHATE 120 MG/30ML IJ SOLN
2.0000 mg | Freq: Once | INTRAMUSCULAR | Status: AC
  Administered 2022-03-06: 2 mg via INTRA_ARTICULAR

## 2022-03-06 NOTE — Progress Notes (Unsigned)
Subjective: Chief Complaint  Patient presents with   Routine Post Op    Left foot pin removal post op, has a bump on the site, Patient denies any pain    Jacqueline Bruce is a 71 y.o. is seen today in office s/p left foot HWR preformed on 12/28/2021.  States that her pain is controlled however she is having some discomfort if anything rubs the top where the incision was.  She otherwise developed.  Denies any drainage or pus.  No fever or chills.  No other concerns.     Objective: General: No acute distress, AAOx3  DP/PT pulses palpable 2/4, CRT < 3 sec to all digits.  Protective sensation intact. Motor function intact.  Left foot: Incision is well coapted without any evidence of dehiscence.  There is 1 localized area of edema and erythema present.  Does have a localized area of edema which is mobile and soft.  There is no scarring present to this area that I can palpate.  There is no surrounding erythema, ascending cellulitis, fluctuance, crepitus, malodor, drainage/purulence. There is localized mild edema around the surgical site. There is no significant pain along the surgical site.  No other areas of tenderness to bilateral lower extremities.  No other open lesions or pre-ulcerative lesions.  No pain with calf compression, swelling, warmth, erythema.   Assessment and Plan:  Status post left foot hardware removal, doing well with no complications   -Treatment options discussed including all alternatives, risks, and complications -X-rays obtained reviewed.  3 views of the left foot were obtained.  No evidence of acute fracture. -There is localized edema.  We discussed using Voltaren gel and massaging of the area as well as ice, compression.  There is some localized erythema with this as well but does not seem to be in fraction more inflammation but needs to monitor closely.  Once the erythema resolves if there is still any fluid discussed aspiration and injection.  Continue shoes to avoid  pressure from irritation. -Ice/elevation -Pain medication as needed. -Monitor for any clinical signs or symptoms of infection and DVT/PE and directed to call the office immediately should any occur or go to the ER. -Follow-up as scheduled or sooner if any problems arise. In the meantime, encouraged to call the office with any questions, concerns, change in symptoms.   Celesta Gentile, DPM

## 2022-03-06 NOTE — Patient Instructions (Signed)

## 2022-03-17 LAB — CUP PACEART REMOTE DEVICE CHECK
Date Time Interrogation Session: 20231025231803
Implantable Pulse Generator Implant Date: 20210107

## 2022-03-20 ENCOUNTER — Ambulatory Visit (INDEPENDENT_AMBULATORY_CARE_PROVIDER_SITE_OTHER): Payer: Medicare Other

## 2022-03-20 DIAGNOSIS — I639 Cerebral infarction, unspecified: Secondary | ICD-10-CM

## 2022-03-27 DIAGNOSIS — Z1231 Encounter for screening mammogram for malignant neoplasm of breast: Secondary | ICD-10-CM | POA: Diagnosis not present

## 2022-04-07 DIAGNOSIS — M1990 Unspecified osteoarthritis, unspecified site: Secondary | ICD-10-CM | POA: Diagnosis not present

## 2022-04-07 DIAGNOSIS — Z0000 Encounter for general adult medical examination without abnormal findings: Secondary | ICD-10-CM | POA: Diagnosis not present

## 2022-04-07 DIAGNOSIS — E7800 Pure hypercholesterolemia, unspecified: Secondary | ICD-10-CM | POA: Diagnosis not present

## 2022-04-07 DIAGNOSIS — H3530 Unspecified macular degeneration: Secondary | ICD-10-CM | POA: Diagnosis not present

## 2022-04-07 DIAGNOSIS — G479 Sleep disorder, unspecified: Secondary | ICD-10-CM | POA: Diagnosis not present

## 2022-04-07 DIAGNOSIS — Z8673 Personal history of transient ischemic attack (TIA), and cerebral infarction without residual deficits: Secondary | ICD-10-CM | POA: Diagnosis not present

## 2022-04-07 DIAGNOSIS — H9190 Unspecified hearing loss, unspecified ear: Secondary | ICD-10-CM | POA: Diagnosis not present

## 2022-04-07 DIAGNOSIS — N1831 Chronic kidney disease, stage 3a: Secondary | ICD-10-CM | POA: Diagnosis not present

## 2022-04-07 DIAGNOSIS — M8588 Other specified disorders of bone density and structure, other site: Secondary | ICD-10-CM | POA: Diagnosis not present

## 2022-04-21 ENCOUNTER — Ambulatory Visit: Payer: Medicare Other | Attending: Internal Medicine | Admitting: Internal Medicine

## 2022-04-21 ENCOUNTER — Encounter: Payer: Self-pay | Admitting: Internal Medicine

## 2022-04-21 VITALS — BP 102/64 | HR 48 | Ht 67.0 in | Wt 140.8 lb

## 2022-04-21 DIAGNOSIS — I639 Cerebral infarction, unspecified: Secondary | ICD-10-CM

## 2022-04-21 DIAGNOSIS — R001 Bradycardia, unspecified: Secondary | ICD-10-CM

## 2022-04-21 NOTE — Progress Notes (Signed)
HPI Jacqueline Bruce returns today for ongoing followup. She has a h/o cryptogenic stroke, s/p ILR insertion almost 3 years ago. In the interim she notes easy bruisability. No palpitations. No syncope. She is walking/hiking regularly but does have trouble with the humidity. No palpitations.  No Known Allergies   Current Outpatient Medications  Medication Sig Dispense Refill   aspirin EC 81 MG tablet Take 81 mg by mouth daily. Swallow whole.     CALCIUM PO Take 600 mg by mouth in the morning and at bedtime.     cetirizine (ZYRTEC ALLERGY) 10 MG tablet 1/2 tablet     clonazePAM (KLONOPIN) 0.5 MG tablet Take 0.5 mg by mouth at bedtime. TAKES BETWEEN 1/2 AND 1 TAB DAILY AT BEDTIME     EFUDEX 5 % cream Apply topically. USES AS PRN     GNP IRON 200 (65 Fe) MG TABS      ibandronate (BONIVA) 150 MG tablet Take 150 mg by mouth every 30 (thirty) days.     ketorolac (ACULAR) 0.5 % ophthalmic solution Place into both eyes. USES AS PRN     Multiple Vitamins-Minerals (PRESERVISION AREDS 2 PO) Take 1 tablet by mouth daily.     Omega-3 Fatty Acids (FISH OIL CONCENTRATE PO) Take 1 tablet by mouth daily.      simvastatin (ZOCOR) 40 MG tablet Take 1 tablet by mouth every evening.     vitamin C (ASCORBIC ACID) 500 MG tablet 1 tablet     VITAMIN D, ERGOCALCIFEROL, PO Take 1 tablet by mouth daily. TAKES 1000 IU TAKES BID     No current facility-administered medications for this visit.     Past Medical History:  Diagnosis Date   Acute CVA (cerebrovascular accident) ([place]) 04/16/2019   Allergy    Anxiety 04/15/2019   Bradycardia with 41-50 beats per minute 04/15/2019   Factor V Leiden mutation ([place])    Osteopenia 04/15/2019   Slurred speech 04/15/2019   TIA due to embolism ([place]) 04/15/2019    ROS:   All systems reviewed and negative except as noted in the HPI.   Past Surgical History:  Procedure Laterality Date   BUBBLE STUDY  04/25/2019   Procedure: BUBBLE STUDY;  Surgeon: Fay Records,  MD;  Location: [facility];  Service: Cardiovascular;;   TEE WITHOUT CARDIOVERSION N/A 04/25/2019   Procedure: TRANSESOPHAGEAL ECHOCARDIOGRAM (TEE);  Surgeon: Fay Records, MD;  Location: [facility] ENDOSCOPY;  Service: Cardiovascular;  Laterality: N/A;     Family History  Problem Relation Age of Onset   Hypertension Mother    Factor V Leiden deficiency Mother        no history of DVT or PE   Alzheimer's disease Mother    Hypertension Father    Heart disease Father        pacemaker and defib implanted    Atrial fibrillation Father    Stroke Father        at 82   Factor V Leiden deficiency Sister        no history of DVT or PE     Social History   Socioeconomic History   Marital status: Married    Spouse name: Not on file   Number of children: Not on file   Years of education: Not on file   Highest education level: Not on file  Occupational History   Not on file  Tobacco Use   Smoking status: Never   Smokeless tobacco: Never  Vaping Use  Vaping Use: Never used  Substance and Sexual Activity   Alcohol use: Yes    Comment: about 3 oz. some nights   Drug use: No   Sexual activity: Not on file  Other Topics Concern   Not on file  Social History Narrative   Lives with husband    Right handed   Caffeine: 1 cup of coffee a day. Diet coke qotherd    Social Determinants of Radio broadcast assistant Strain: Not on file  Food Insecurity: Not on file  Transportation Needs: Not on file  Physical Activity: Not on file  Stress: Not on file  Social Connections: Not on file  Intimate Partner Violence: Not on file     BP 102/64   Pulse (!) 48   Ht '5\' 7"'$  (1.702 m)   Wt 140 lb 12.8 oz (63.9 kg)   SpO2 97%   BMI 22.05 kg/m   Physical Exam:  Well appearing NAD HEENT: Unremarkable Neck:  No JVD, no thyromegally Lymphatics:  No adenopathy Back:  No CVA tenderness Lungs:  Clear with no wheezes HEART:  Regular rate rhythm, no murmurs, no rubs, no clicks Abd:  soft,  positive bowel sounds, no organomegally, no rebound, no guarding Ext:  2 plus pulses, no edema, no cyanosis, no clubbing Skin:  No rashes no nodules Neuro:  CN II through XII intact, motor grossly intact  EKG - NSR  DEVICE  Normal device function.  See PaceArt for details.   Assess/Plan: Cryptogenic stroke - no clear etiology. She has not had atrial fib.  ILR - she is still about 6-12 months from ERI. We will continue to follow. When she is at RRT, we will remove her ILR. Sinus node dysfunction - her HR's are low but she is asymptomatic. She will undergo watchful waiting HTN - her bp is well controlled. We will continue current meds.  Jacqueline Overlie Blossom Crume,MD

## 2022-04-21 NOTE — Patient Instructions (Signed)
Medication Instructions:  Your physician recommends that you continue on your current medications as directed. Please refer to the Current Medication list given to you today.  *If you need a refill on your cardiac medications before your next appointment, please call your pharmacy*  Lab Work: None ordered.  If you have labs (blood work) drawn today and your tests are completely normal, you will receive your results only by: [place] (if you have MyChart) OR A paper copy in the mail If you have any lab test that is abnormal or we need to change your treatment, we will call you to review the results.  Testing/Procedures: None ordered.  Follow-Up: At [facility], you and your health needs are our priority.  As part of our continuing mission to provide you with exceptional heart care, we have created designated Provider Care Teams.  These Care Teams include your primary Cardiologist (physician) and Advanced Practice Providers (APPs -  Physician Assistants and Nurse Practitioners) who all work together to provide you with the care you need, when you need it.  We recommend signing up for the patient portal called "MyChart".  Sign up information is provided on this After Visit Summary.  MyChart is used to connect with patients for Virtual Visits (Telemedicine).  Patients are able to view lab/test results, encounter notes, upcoming appointments, etc.  Non-urgent messages can be sent to your provider as well.   To learn more about what you can do with MyChart, go to [email or web address].    Your next appointment:   1 year(s)  The format for your next appointment:   In Person  Provider:   Cristopher Peru, MD{or one of the following Advanced Practice Providers on your designated Care Team:   Tommye Standard, [place] Legrand Como "Jonni Sanger" Chalmers Cater, [place]  Remote monitoring is used to monitor your MDT Loop Recorder from home. This monitoring reduces the number of office visits required to check your  device to one time per year. It allows [place] to keep an eye on the functioning of your device to ensure it is working properly. You are scheduled for a device check from home on 04/24/22. You may send your transmission at any time that day. If you have a wireless device, the transmission will be sent automatically. After your physician reviews your transmission, you will receive a postcard with your next transmission date.  Important Information About Sugar     \

## 2022-04-22 NOTE — Progress Notes (Signed)
Carelink Summary Report / Loop Recorder 

## 2022-04-24 ENCOUNTER — Ambulatory Visit (INDEPENDENT_AMBULATORY_CARE_PROVIDER_SITE_OTHER): Payer: Medicare Other

## 2022-04-24 DIAGNOSIS — I639 Cerebral infarction, unspecified: Secondary | ICD-10-CM | POA: Diagnosis not present

## 2022-04-24 LAB — CUP PACEART REMOTE DEVICE CHECK
Date Time Interrogation Session: 20231203232007
Implantable Pulse Generator Implant Date: 20210107

## 2022-05-10 DIAGNOSIS — M1990 Unspecified osteoarthritis, unspecified site: Secondary | ICD-10-CM | POA: Diagnosis not present

## 2022-05-10 DIAGNOSIS — N1831 Chronic kidney disease, stage 3a: Secondary | ICD-10-CM | POA: Diagnosis not present

## 2022-05-10 DIAGNOSIS — Z8673 Personal history of transient ischemic attack (TIA), and cerebral infarction without residual deficits: Secondary | ICD-10-CM | POA: Diagnosis not present

## 2022-05-10 DIAGNOSIS — M8588 Other specified disorders of bone density and structure, other site: Secondary | ICD-10-CM | POA: Diagnosis not present

## 2022-05-29 ENCOUNTER — Ambulatory Visit (INDEPENDENT_AMBULATORY_CARE_PROVIDER_SITE_OTHER): Payer: Medicare Other

## 2022-05-29 DIAGNOSIS — I639 Cerebral infarction, unspecified: Secondary | ICD-10-CM | POA: Diagnosis not present

## 2022-05-30 LAB — CUP PACEART REMOTE DEVICE CHECK
Date Time Interrogation Session: 20240107232504
Implantable Pulse Generator Implant Date: 20210107

## 2022-06-01 NOTE — Progress Notes (Signed)
Carelink Summary Report / Loop Recorder 

## 2022-07-02 LAB — CUP PACEART REMOTE DEVICE CHECK
Date Time Interrogation Session: 20240209231315
Implantable Pulse Generator Implant Date: 20210107

## 2022-07-03 ENCOUNTER — Ambulatory Visit: Payer: Medicare Other

## 2022-07-03 DIAGNOSIS — I639 Cerebral infarction, unspecified: Secondary | ICD-10-CM | POA: Diagnosis not present

## 2022-07-04 NOTE — Progress Notes (Signed)
Carelink Summary Report / Loop Recorder 

## 2022-07-18 DIAGNOSIS — Z8673 Personal history of transient ischemic attack (TIA), and cerebral infarction without residual deficits: Secondary | ICD-10-CM | POA: Diagnosis not present

## 2022-07-18 DIAGNOSIS — R001 Bradycardia, unspecified: Secondary | ICD-10-CM | POA: Diagnosis not present

## 2022-07-18 DIAGNOSIS — N1831 Chronic kidney disease, stage 3a: Secondary | ICD-10-CM | POA: Diagnosis not present

## 2022-07-19 DIAGNOSIS — H2513 Age-related nuclear cataract, bilateral: Secondary | ICD-10-CM | POA: Diagnosis not present

## 2022-07-19 DIAGNOSIS — H353122 Nonexudative age-related macular degeneration, left eye, intermediate dry stage: Secondary | ICD-10-CM | POA: Diagnosis not present

## 2022-07-19 DIAGNOSIS — H353113 Nonexudative age-related macular degeneration, right eye, advanced atrophic without subfoveal involvement: Secondary | ICD-10-CM | POA: Diagnosis not present

## 2022-07-19 DIAGNOSIS — H43823 Vitreomacular adhesion, bilateral: Secondary | ICD-10-CM | POA: Diagnosis not present

## 2022-07-27 DIAGNOSIS — E7800 Pure hypercholesterolemia, unspecified: Secondary | ICD-10-CM | POA: Diagnosis not present

## 2022-07-27 DIAGNOSIS — N1831 Chronic kidney disease, stage 3a: Secondary | ICD-10-CM | POA: Diagnosis not present

## 2022-07-27 DIAGNOSIS — M8588 Other specified disorders of bone density and structure, other site: Secondary | ICD-10-CM | POA: Diagnosis not present

## 2022-07-27 DIAGNOSIS — R0609 Other forms of dyspnea: Secondary | ICD-10-CM | POA: Diagnosis not present

## 2022-07-27 DIAGNOSIS — Z8673 Personal history of transient ischemic attack (TIA), and cerebral infarction without residual deficits: Secondary | ICD-10-CM | POA: Diagnosis not present

## 2022-07-28 ENCOUNTER — Ambulatory Visit
Admission: RE | Admit: 2022-07-28 | Discharge: 2022-07-28 | Disposition: A | Discharge status: Home / Self Care | Payer: Medicare Other | Source: Ambulatory Visit | Attending: Internal Medicine | Admitting: Internal Medicine

## 2022-07-28 ENCOUNTER — Other Ambulatory Visit: Payer: Self-pay | Admitting: Internal Medicine

## 2022-07-28 DIAGNOSIS — R0609 Other forms of dyspnea: Secondary | ICD-10-CM

## 2022-08-02 DIAGNOSIS — H353132 Nonexudative age-related macular degeneration, bilateral, intermediate dry stage: Secondary | ICD-10-CM | POA: Diagnosis not present

## 2022-08-07 ENCOUNTER — Ambulatory Visit (INDEPENDENT_AMBULATORY_CARE_PROVIDER_SITE_OTHER): Payer: Medicare Other

## 2022-08-07 DIAGNOSIS — I639 Cerebral infarction, unspecified: Secondary | ICD-10-CM | POA: Diagnosis not present

## 2022-08-08 LAB — CUP PACEART REMOTE DEVICE CHECK
Date Time Interrogation Session: 20240317232157
Implantable Pulse Generator Implant Date: 20210107

## 2022-08-16 NOTE — Progress Notes (Signed)
Carelink Summary Report / Loop Recorder 

## 2022-09-11 ENCOUNTER — Ambulatory Visit: Payer: Medicare Other

## 2022-09-11 LAB — CUP PACEART REMOTE DEVICE CHECK
Date Time Interrogation Session: 20240419230918
Implantable Pulse Generator Implant Date: 20210107

## 2022-09-14 ENCOUNTER — Ambulatory Visit (INDEPENDENT_AMBULATORY_CARE_PROVIDER_SITE_OTHER): Payer: Medicare Other

## 2022-09-14 DIAGNOSIS — I639 Cerebral infarction, unspecified: Secondary | ICD-10-CM

## 2022-09-18 NOTE — Progress Notes (Signed)
Carelink Summary Report / Loop Recorder 

## 2022-10-10 NOTE — Progress Notes (Signed)
Carelink Summary Report / Loop Recorder 

## 2022-10-12 LAB — CUP PACEART REMOTE DEVICE CHECK
Date Time Interrogation Session: 20240522231132
Implantable Pulse Generator Implant Date: 20210107

## 2022-10-13 DIAGNOSIS — N1831 Chronic kidney disease, stage 3a: Secondary | ICD-10-CM | POA: Diagnosis not present

## 2022-10-17 ENCOUNTER — Ambulatory Visit (INDEPENDENT_AMBULATORY_CARE_PROVIDER_SITE_OTHER): Payer: Medicare Other

## 2022-10-17 DIAGNOSIS — I639 Cerebral infarction, unspecified: Secondary | ICD-10-CM

## 2022-10-19 ENCOUNTER — Other Ambulatory Visit (HOSPITAL_COMMUNITY): Payer: Self-pay | Admitting: Internal Medicine

## 2022-10-23 DIAGNOSIS — N1831 Chronic kidney disease, stage 3a: Secondary | ICD-10-CM | POA: Diagnosis not present

## 2022-10-23 DIAGNOSIS — M8588 Other specified disorders of bone density and structure, other site: Secondary | ICD-10-CM | POA: Diagnosis not present

## 2022-11-02 ENCOUNTER — Encounter (HOSPITAL_COMMUNITY)
Admission: RE | Admit: 2022-11-02 | Discharge: 2022-11-02 | Disposition: A | Discharge status: Home / Self Care | Payer: Medicare Other | Source: Ambulatory Visit | Attending: Internal Medicine | Admitting: Internal Medicine

## 2022-11-02 DIAGNOSIS — E210 Primary hyperparathyroidism: Secondary | ICD-10-CM | POA: Diagnosis not present

## 2022-11-02 MED ORDER — TECHNETIUM TC 99M SESTAMIBI GENERIC - CARDIOLITE
26.3000 | Freq: Once | INTRAVENOUS | Status: AC | PRN
  Administered 2022-11-02: 26.3 via INTRAVENOUS

## 2022-11-13 NOTE — Progress Notes (Signed)
Carelink Summary Report / Loop Recorder 

## 2022-11-14 LAB — CUP PACEART REMOTE DEVICE CHECK
Date Time Interrogation Session: 20240624231156
Implantable Pulse Generator Implant Date: 20210107

## 2022-11-20 ENCOUNTER — Ambulatory Visit (INDEPENDENT_AMBULATORY_CARE_PROVIDER_SITE_OTHER): Payer: Medicare Other

## 2022-11-20 DIAGNOSIS — L570 Actinic keratosis: Secondary | ICD-10-CM | POA: Diagnosis not present

## 2022-11-20 DIAGNOSIS — Z85828 Personal history of other malignant neoplasm of skin: Secondary | ICD-10-CM | POA: Diagnosis not present

## 2022-11-20 DIAGNOSIS — I639 Cerebral infarction, unspecified: Secondary | ICD-10-CM | POA: Diagnosis not present

## 2022-11-20 DIAGNOSIS — D225 Melanocytic nevi of trunk: Secondary | ICD-10-CM | POA: Diagnosis not present

## 2022-11-20 DIAGNOSIS — L565 Disseminated superficial actinic porokeratosis (DSAP): Secondary | ICD-10-CM | POA: Diagnosis not present

## 2022-11-20 DIAGNOSIS — L821 Other seborrheic keratosis: Secondary | ICD-10-CM | POA: Diagnosis not present

## 2022-12-07 NOTE — Progress Notes (Signed)
Carelink Summary Report / Loop Recorder 

## 2022-12-21 ENCOUNTER — Telehealth: Payer: Self-pay | Admitting: *Deleted

## 2022-12-21 DIAGNOSIS — E210 Primary hyperparathyroidism: Secondary | ICD-10-CM | POA: Diagnosis not present

## 2022-12-21 NOTE — Telephone Encounter (Signed)
   Pre-operative Risk Assessment    Patient Name: Jacqueline Bruce  DOB: 13-Oct-1950 MRN: [id number]      Request for Surgical Clearance    Procedure:   PARATHYROID SURGERY  Date of Surgery:  Clearance TBD                                 Surgeon:  DR. Darnell Level Surgeon's Group or Practice Name:  [facility] Phone number:  [phone] Fax number:  [phone] ATTN: Michel Bickers, LPN   Type of Clearance Requested:   - Medical ; ASA    Type of Anesthesia:  General    Additional requests/questions:    Elpidio Anis   12/21/2022, 5:03 PM

## 2022-12-25 ENCOUNTER — Ambulatory Visit (INDEPENDENT_AMBULATORY_CARE_PROVIDER_SITE_OTHER): Payer: Medicare Other

## 2022-12-25 DIAGNOSIS — I639 Cerebral infarction, unspecified: Secondary | ICD-10-CM

## 2022-12-25 NOTE — Telephone Encounter (Signed)
   Name: Jacqueline Bruce  DOB: 11-01-1950  MRN: [id number]  Primary Cardiologist: None   Preoperative team, please contact this patient and set up a phone call appointment for further preoperative risk assessment. Please obtain consent and complete medication review. Thank you for your help.  I confirm that guidance regarding antiplatelet and oral anticoagulation therapy has been completed and, if necessary, noted below.  Aspirin prescribed by a noncardiology provider therefore recommendations for holding deferred to prescribing provider (neurology).     Carlos Levering, NP 12/25/2022, 10:32 AM [facility]

## 2022-12-25 NOTE — Telephone Encounter (Signed)
1st attempt to reach pt.  Left pt a message to call back and ask for the preop team.

## 2022-12-26 ENCOUNTER — Telehealth: Payer: Self-pay

## 2022-12-26 NOTE — Telephone Encounter (Signed)
Pt states she has trips planned and will not be scheduling surgery date until mid Oct. Pt and husband will return from last trip 10/8. Pt is scheduled for 10/10 at 9am for clearance. Med rec and consent done.     Patient Consent for Virtual Visit        ANASOPHIA MINERO has provided verbal consent on 12/26/2022 for a virtual visit (video or telephone).   CONSENT FOR VIRTUAL VISIT FOR:  Donney Dice  By participating in this virtual visit I agree to the following:  I hereby voluntarily request, consent and authorize [facility] and its employed or contracted physicians, physician assistants, nurse practitioners or other licensed health care professionals (the Practitioner), to provide me with telemedicine health care services (the "Services") as deemed necessary by the treating Practitioner. I acknowledge and consent to receive the Services by the Practitioner via telemedicine. I understand that the telemedicine visit will involve communicating with the Practitioner through live audiovisual communication technology and the disclosure of certain medical information by electronic transmission. I acknowledge that I have been given the opportunity to request an in-person assessment or other available alternative prior to the telemedicine visit and am voluntarily participating in the telemedicine visit.  I understand that I have the right to withhold or withdraw my consent to the use of telemedicine in the course of my care at any time, without affecting my right to future care or treatment, and that the Practitioner or I may terminate the telemedicine visit at any time. I understand that I have the right to inspect all information obtained and/or recorded in the course of the telemedicine visit and may receive copies of available information for a reasonable fee.  I understand that some of the potential risks of receiving the Services via telemedicine include:  Delay or interruption in  medical evaluation due to technological equipment failure or disruption; Information transmitted may not be sufficient (e.g. poor resolution of images) to allow for appropriate medical decision making by the Practitioner; and/or  In rare instances, security protocols could fail, causing a breach of personal health information.  Furthermore, I acknowledge that it is my responsibility to provide information about my medical history, conditions and care that is complete and accurate to the best of my ability. I acknowledge that Practitioner's advice, recommendations, and/or decision may be based on factors not within their control, such as incomplete or inaccurate data provided by me or distortions of diagnostic images or specimens that may result from electronic transmissions. I understand that the practice of medicine is not an exact science and that Practitioner makes no warranties or guarantees regarding treatment outcomes. I acknowledge that a copy of this consent can be made available to me via my patient portal [facility] MyChart), or I can request a printed copy by calling the office of [facility].    I understand that my insurance will be billed for this visit.   I have read or had this consent read to me. I understand the contents of this consent, which adequately explains the benefits and risks of the Services being provided via telemedicine.  I have been provided ample opportunity to ask questions regarding this consent and the Services and have had my questions answered to my satisfaction. I give my informed consent for the services to be provided through the use of telemedicine in my medical care

## 2022-12-26 NOTE — Telephone Encounter (Signed)
Pt states she has trips planned and will not be scheduling surgery date until mid Oct. Pt and husband will return from last trip 10/8. Pt is scheduled for 10/10 at 9am for clearance. Med rec and consent done.

## 2022-12-27 ENCOUNTER — Telehealth: Payer: Self-pay

## 2022-12-27 NOTE — Telephone Encounter (Signed)
Received a fax from [facility] for Clearance. I will placed forms on Ihor Austin, NP inbox.

## 2022-12-28 NOTE — Telephone Encounter (Signed)
Received clearance request for patient to undergo parathyroid surgery, not yet scheduled, by [facility] [place] [facility] surgery.  History of stroke 03/2019 currently on aspirin therapy.  As long as patient has not had any new stroke/TIAs or symptoms over the past 6 months, can proceed with surgical procedure with holding aspirin for 3 to 5 days prior with small but acceptable risk of recurrent stroke while off therapy and recommend restarting immediately after or once stable.

## 2023-01-05 DIAGNOSIS — H353122 Nonexudative age-related macular degeneration, left eye, intermediate dry stage: Secondary | ICD-10-CM | POA: Diagnosis not present

## 2023-01-05 DIAGNOSIS — H43823 Vitreomacular adhesion, bilateral: Secondary | ICD-10-CM | POA: Diagnosis not present

## 2023-01-05 DIAGNOSIS — H353113 Nonexudative age-related macular degeneration, right eye, advanced atrophic without subfoveal involvement: Secondary | ICD-10-CM | POA: Diagnosis not present

## 2023-01-05 DIAGNOSIS — H2513 Age-related nuclear cataract, bilateral: Secondary | ICD-10-CM | POA: Diagnosis not present

## 2023-01-08 DIAGNOSIS — E213 Hyperparathyroidism, unspecified: Secondary | ICD-10-CM | POA: Diagnosis not present

## 2023-01-08 DIAGNOSIS — M8588 Other specified disorders of bone density and structure, other site: Secondary | ICD-10-CM | POA: Diagnosis not present

## 2023-01-08 DIAGNOSIS — N1831 Chronic kidney disease, stage 3a: Secondary | ICD-10-CM | POA: Diagnosis not present

## 2023-01-08 DIAGNOSIS — Z8673 Personal history of transient ischemic attack (TIA), and cerebral infarction without residual deficits: Secondary | ICD-10-CM | POA: Diagnosis not present

## 2023-01-09 NOTE — Progress Notes (Signed)
Carelink Summary Report / Loop Recorder 

## 2023-01-29 ENCOUNTER — Ambulatory Visit (INDEPENDENT_AMBULATORY_CARE_PROVIDER_SITE_OTHER): Payer: Medicare Other

## 2023-01-29 DIAGNOSIS — I639 Cerebral infarction, unspecified: Secondary | ICD-10-CM

## 2023-01-29 LAB — CUP PACEART REMOTE DEVICE CHECK
Date Time Interrogation Session: 20240906230753
Implantable Pulse Generator Implant Date: 20210107

## 2023-02-15 NOTE — Progress Notes (Signed)
Carelink Summary Report / Loop Recorder 

## 2023-03-01 ENCOUNTER — Ambulatory Visit: Payer: Medicare Other | Attending: Internal Medicine | Admitting: Nurse Practitioner

## 2023-03-01 DIAGNOSIS — Z01810 Encounter for preprocedural cardiovascular examination: Secondary | ICD-10-CM

## 2023-03-01 NOTE — Progress Notes (Signed)
Virtual Visit via Telephone Note   Because of Jacqueline Bruce's co-morbid illnesses, she is at least at moderate risk for complications without adequate follow up.  This format is felt to be most appropriate for this patient at this time.  The patient did not have access to video technology/had technical difficulties with video requiring transitioning to audio format only (telephone).  All issues noted in this document were discussed and addressed.  No physical exam could be performed with this format.  Please refer to the patient's chart for her consent to telehealth for [facility].  Evaluation Performed:  Preoperative cardiovascular risk assessment _____________   Date:  03/01/2023   Patient ID:  Jacqueline Bruce, DOB 1950-09-04, MRN [id number] Patient Location:  Home Provider location:   Office  Primary Care Provider:  Milus Height, PA Primary Cardiologist:  None  Chief Complaint / Patient Profile   72 y.o. y/o female with a h/o h/o cryptogenic stroke s/p ILR, bradycardia, factor V Leiden mutation, osteopenia, and anxiety who is pending parathyroid surgery with Dr. Darnell Level of [facility] and presents today for telephonic preoperative cardiovascular risk assessment.  History of Present Illness    Jacqueline Bruce is a 72 y.o. female who presents via audio/video conferencing for a telehealth visit today.  Pt was last seen in cardiology clinic on 04/21/2022 by Dr. Ladona Ridgel.  At that time Jacqueline Bruce was doing well. The patient is now pending procedure as outlined above. Since her last visit, she has done well from a cardiac standpoint.  Return from a trip to [place] where she went hiking.  She notes she is interested in having her ILR removed when she follows up next with Dr. Ladona Ridgel.  She denies chest pain, palpitations, dyspnea, pnd, orthopnea, n, v, dizziness, syncope, edema, weight gain, or early satiety. All other systems reviewed and are otherwise  negative except as noted above.   Past Medical History    Past Medical History:  Diagnosis Date   Acute CVA (cerebrovascular accident) ([facility]) 04/16/2019   Allergy    Anxiety 04/15/2019   Bradycardia with 41-50 beats per minute 04/15/2019   Factor V Leiden mutation ([facility])    Osteopenia 04/15/2019   Slurred speech 04/15/2019   TIA due to embolism ([facility]) 04/15/2019   Past Surgical History:  Procedure Laterality Date   BUBBLE STUDY  04/25/2019   Procedure: BUBBLE STUDY;  Surgeon: Pricilla Riffle, MD;  Location: [facility] ENDOSCOPY;  Service: Cardiovascular;;   TEE WITHOUT CARDIOVERSION N/A 04/25/2019   Procedure: TRANSESOPHAGEAL ECHOCARDIOGRAM (TEE);  Surgeon: Pricilla Riffle, MD;  Location: [facility] ENDOSCOPY;  Service: Cardiovascular;  Laterality: N/A;    Allergies  No Known Allergies  Home Medications    Prior to Admission medications   Medication Sig Start Date End Date Taking? Authorizing Provider  aspirin EC 81 MG tablet Take 81 mg by mouth daily. Swallow whole.    [provider]  CALCIUM PO Take 600 mg by mouth in the morning and at bedtime.    [provider]  cetirizine (ZYRTEC ALLERGY) 10 MG tablet 1/2 tablet    [provider]  clonazePAM (KLONOPIN) 0.5 MG tablet Take 0.5 mg by mouth at bedtime. TAKES BETWEEN 1/2 AND 1 TAB DAILY AT BEDTIME 04/11/19   [provider]  EFUDEX 5 % cream Apply topically. USES AS PRN 09/28/21   [provider]  GNP IRON 200 (65 Fe) MG TABS  10/03/21   [provider]  ibandronate (BONIVA) 150 MG tablet Take 150 mg by mouth every 30 (thirty) days. 11/25/18   [provider]  ketorolac (ACULAR) 0.5 % ophthalmic solution Place into both eyes. USES AS PRN 08/25/21   [provider]  Multiple Vitamins-Minerals (PRESERVISION AREDS 2 PO) Take 1 tablet by mouth daily.    [provider]  Omega-3 Fatty Acids (FISH OIL CONCENTRATE PO) Take 1 tablet by mouth daily.     [provider]   simvastatin (ZOCOR) 40 MG tablet Take 1 tablet by mouth every evening.    [provider]  vitamin C (ASCORBIC ACID) 500 MG tablet 1 tablet    [provider]  VITAMIN D, ERGOCALCIFEROL, PO Take 1 tablet by mouth daily. TAKES 1000 IU TAKES BID    [provider]    Physical Exam    Vital Signs:  Jacqueline Bruce does not have vital signs available for review today.  Given telephonic nature of communication, physical exam is limited. AAOx3. NAD. Normal affect.  Speech and respirations are unlabored.  Accessory Clinical Findings    None  Assessment & Plan    1.  Preoperative Cardiovascular Risk Assessment:  According to the Revised Cardiac Risk Index (RCRI), her Perioperative Risk of Major Cardiac Event is (%): 0.9. Her Functional Capacity in METs is: 6.79 according to the [facility] Activity Status Index (DASI). Therefore, based on ACC/AHA guidelines, patient would be at acceptable risk for the planned procedure without further cardiovascular testing.  The patient was advised that if she develops new symptoms prior to surgery to contact our office to arrange for a follow-up visit, and she verbalized understanding.  Aspirin prescribed by a noncardiology provider. Recommendations for holding Aspirin prior to surgery should come from managing provider (neurology).      A copy of this note will be routed to requesting surgeon.  Time:   Today, I have spent 5 minutes with the patient with telehealth technology discussing medical history, symptoms, and management plan.     Joylene Grapes, NP  03/01/2023, 9:13 AM

## 2023-03-05 ENCOUNTER — Ambulatory Visit (INDEPENDENT_AMBULATORY_CARE_PROVIDER_SITE_OTHER): Payer: Medicare Other

## 2023-03-05 DIAGNOSIS — I639 Cerebral infarction, unspecified: Secondary | ICD-10-CM | POA: Diagnosis not present

## 2023-03-06 LAB — CUP PACEART REMOTE DEVICE CHECK
Date Time Interrogation Session: 20241013231843
Implantable Pulse Generator Implant Date: 20210107

## 2023-03-07 NOTE — Progress Notes (Signed)
Sent message, via epic in basket, requesting orders in epic from surgeon.  

## 2023-03-08 ENCOUNTER — Ambulatory Visit: Payer: Self-pay | Admitting: Surgery

## 2023-03-08 NOTE — Progress Notes (Addendum)
COVID Vaccine received:  []  No [x]  Yes Date of any COVID positive Test in last 90 days: no PCP - Altamease Oiler Redmon PA Cardiologist -  Electrophys- Lewayne Bunting MD  Chest x-ray - 07/28/22 Epic EKG -  04/21/22 Epic Stress Test - no ECHO - no Cardiac Cath - no  Cardiac clearance 03/01/23 Bernadene Person NP  Bowel Prep - [x]  No  []   Yes ______  Pacemaker / ICD device []  No [x]  Yes   Spinal Cord Stimulator:[x]  No []  Yes       History of Sleep Apnea? [x]  No []  Yes   CPAP used?- [x]  No []  Yes    Does the patient monitor blood sugar?          [x]  No []  Yes  []  N/A  Patient has: [x]  NO Hx DM   []  Pre-DM                 []  DM1  []   DM2 Does patient have a [employer] or Dexacom? []  No []  Yes   Fasting Blood Sugar Ranges-  Checks Blood Sugar _____ times a day  GLP1 agonist / usual dose - no GLP1 instructions:  SGLT-2 inhibitors / usual dose - no SGLT-2 instructions:   Blood Thinner / Instructions: Aspirin Instructions:81 mg Daily will Stop 5 days prior to surgery per MD office per pt.  Comments:   Activity level: Patient is able  to climb a flight of stairs without difficulty; [x]  No CP  [x]  No SOB,    Patient can perform ADLs without assistance.   Anesthesia review: TIA, Factor 5 deficiency, loop recorder.  Patient denies shortness of breath, fever, cough and chest pain at PAT appointment.  Patient verbalized understanding and agreement to the Pre-Surgical Instructions that were given to them at this PAT appointment. Patient was also educated of the need to review these PAT instructions again prior to his/her surgery.I reviewed the appropriate phone numbers to call if they have any and questions or concerns.

## 2023-03-08 NOTE — H&P (Signed)
REFERRING PHYSICIAN: Redmon, Lanice Schwab, PA  PROVIDER: Anthoney Sheppard Myra Rude, MD   Chief Complaint: New Consultation (Primary hyperparathyroidism)  History of Present Illness:  Patient is referred by Dr. Ocie Cornfield for surgical evaluation and management of primary hyperparathyroidism. Patient had been noted on routine laboratory studies to have an elevated serum calcium level. Her most elevated level was in 2023 at 12.8. Repeat level in May 2024 remained elevated at 10.8. Intact PTH level was elevated at 69. Vitamin D levels were normal. Patient was referred and underwent a nuclear medicine parathyroid scan on November 02, 2022. This localized a right inferior parathyroid adenoma. Patient has not had any other imaging studies. Patient does note significant fatigue. She has been diagnosed with osteopenia. She denies bone and joint discomfort. She denies nephrolithiasis. She has had no recent fractures.  Patient does have a history of TIA. She was evaluated by neurology and cardiology. She remains on a baby aspirin daily.  Patient also relates a family history of factor V deficiency. Patient denies ever having to be treated perioperatively with DDAVP or to be anticoagulated.  Review of Systems: A complete review of systems was obtained from the patient. I have reviewed this information and discussed as appropriate with the patient. See HPI as well for other ROS.  Review of Systems  Constitutional: Positive for malaise/fatigue.  HENT: Negative.  Eyes: Negative.  Respiratory: Negative.  Cardiovascular: Negative.  Gastrointestinal: Negative.  Genitourinary: Negative.  Musculoskeletal: Negative.  Skin: Negative.  Neurological: Negative.  Endo/Heme/Allergies: Negative.  Psychiatric/Behavioral: Negative.    Medical History: Past Medical History:  Diagnosis Date  Anxiety  History of stroke   Patient Active Problem List  Diagnosis  Primary hyperparathyroidism (CMS/HHS-HCC)    History reviewed. No pertinent surgical history.   No Known Allergies  Current Outpatient Medications on File Prior to Visit  Medication Sig Dispense Refill  ascorbic acid, vitamin C, (VITAMIN C) 500 MG tablet 1 tablet  aspirin 81 MG EC tablet Take by mouth  cetirizine (ZYRTEC) 10 MG tablet Take 10 mg by mouth once daily  clonazePAM (KLONOPIN) 1 MG tablet 1/2 to 1 tablet Orally at bedtime as needed for sleep  ibandronate (BONIVA) 150 mg tablet  omega-3 fatty acids-fish oil 300-1,000 mg capsule Take 1 tablet by mouth once daily  simvastatin (ZOCOR) 40 MG tablet Take 1 tablet by mouth every evening   No current facility-administered medications on file prior to visit.   Family History  Problem Relation Age of Onset  Skin cancer Sister  Deep vein thrombosis (DVT or abnormal blood clot formation) Brother    Social History   Tobacco Use  Smoking Status Never  Smokeless Tobacco Never    Social History   Socioeconomic History  Marital status: Married  Tobacco Use  Smoking status: Never  Smokeless tobacco: Never  Vaping Use  Vaping status: Never Used  Substance and Sexual Activity  Alcohol use: Yes  Drug use: Never   Objective:   Vitals:  BP: 116/78  Pulse: 70  Temp: 36.7 C (98 F)  SpO2: 97%  Weight: 63.6 kg (140 lb 3.2 oz)  Height: 168.9 cm (5' 6.5")  PainSc: 0-No pain   Body mass index is 22.29 kg/m.  Physical Exam   GENERAL APPEARANCE Comfortable, no acute issues Development: normal Gross deformities: none  SKIN Rash, lesions, ulcers: none Induration, erythema: none Nodules: none palpable  EYES Conjunctiva and lids: normal Pupils: equal and reactive  EARS, NOSE, MOUTH, THROAT External ears: no lesion  or deformity External nose: no lesion or deformity Hearing: grossly normal  NECK Symmetric: yes Trachea: midline Thyroid: no palpable nodules in the thyroid bed  ABDOMEN Not assessed  GENITOURINARY/RECTAL Not  assessed  MUSCULOSKELETAL Station and gait: normal Digits and nails: no clubbing or cyanosis Muscle strength: grossly normal all extremities Range of motion: grossly normal all extremities Deformity: none  LYMPHATIC Cervical: none palpable Supraclavicular: none palpable  PSYCHIATRIC Oriented to person, place, and time: yes Mood and affect: normal for situation Judgment and insight: appropriate for situation   Assessment and Plan:   Primary hyperparathyroidism (CMS/HHS-HCC)  Patient is referred by her endocrinologist, Dr. Ocie Cornfield, for surgical evaluation and management of primary hyperparathyroidism.  Patient provided with a copy of "Parathyroid Surgery: Treatment for Your Parathyroid Gland Problem", published by [employer], 12 pages. Book reviewed and explained to patient during visit today.  Today we reviewed her clinical history. We reviewed her laboratory studies and her imaging study. Patient appears to have a right inferior parathyroid adenoma. I would like to obtain an ultrasound examination of the neck in hopes of confirming the location of the adenoma and to rule out any concurrent thyroid disease.  Given the patient's history of TIA, we will also check with her neurologist for perioperative clearance. I would like to know whether or not she may discontinue the aspirin around the time of surgery. We will also ask for cardiac clearance from her cardiologist.  Patient relates a family history of factor V deficiency. We will check with her primary care provider regarding whether or not the patient will need any medical treatment related to factor V deficiency around the time of surgery.  Patient has several family commitments and travel plans over the next couple of months. It is likely that she will not be able to schedule a date for her surgery until October 2024.  Today we did discuss parathyroid surgery. I provided her with written literature on parathyroid surgery to  review at home. We discussed the size and location of the surgical incision. We discussed the risk and benefits of the procedure including the risk of recurrent laryngeal nerve injury. We discussed the postoperative recovery. We discussed the potential for a second gland adenoma. The patient understands and wishes to proceed with surgery at sometime in the near future.   Darnell Level, MD [facility] practice Office: [phone]

## 2023-03-08 NOTE — Patient Instructions (Addendum)
SURGICAL WAITING ROOM VISITATION  Patients having surgery or a procedure may have no more than 2 support people in the waiting area - these visitors may rotate.    Children under the age of 52 must have an adult with them who is not the patient.  Due to an increase in RSV and influenza rates and associated hospitalizations, children ages 60 and under may not visit patients in [facility] hospitals.  If the patient needs to stay at the hospital during part of their recovery, the visitor guidelines for inpatient rooms apply. Pre-op nurse will coordinate an appropriate time for 1 support person to accompany patient in pre-op.  This support person may not rotate.    Please refer to the [facility] website for the visitor guidelines for Inpatients (after your surgery is over and you are in a regular room).       Your procedure is scheduled on: 03/15/23   Report to [facility] Main Entrance    Report to admitting at  7:15 AM   Call this number if you have problems the morning of surgery [phone]   Do not eat food :After Midnight.   After Midnight you may have the following liquids until 6:30 AM DAY OF SURGERY  Water Non-Citrus Juices (without pulp, NO RED-Apple, White grape, White cranberry) Black Coffee (NO MILK/CREAM OR CREAMERS, sugar ok)  Clear Tea (NO MILK/CREAM OR CREAMERS, sugar ok) regular and decaf                             Plain Jell-O (NO RED)                                           Fruit ices (not with fruit pulp, NO RED)                                     Popsicles (NO RED)                                                               Sports drinks like Gatorade (NO RED)                Oral Hygiene is also important to reduce your risk of infection.                                    Remember - BRUSH YOUR TEETH THE MORNING OF SURGERY WITH YOUR REGULAR TOOTHPASTE      Stop all vitamins and herbal supplements 7 days before surgery.   Take these  medicines the morning of surgery with A SIP OF WATER:  Zyrtec, Clonazepam, Simvastatin             You may not have any metal on your body including hair pins, jewelry, and body piercing             Do not wear make-up, lotions, powders, perfumes/cologne, or deodorant  Do not wear nail polish including  gel and S&S, artificial/acrylic nails, or any other type of covering on natural nails including finger and toenails. If you have artificial nails, gel coating, etc. that needs to be removed by a nail salon please have this removed prior to surgery or surgery may need to be canceled/ delayed if the surgeon/ anesthesia feels like they are unable to be safely monitored.   Do not shave  48 hours prior to surgery.               Men may shave face and neck.   Do not bring valuables to the hospital. [facility] IS NOT             RESPONSIBLE   FOR VALUABLES.   Contacts, glasses, dentures or bridgework may not be worn into surgery.  DO NOT BRING YOUR HOME MEDICATIONS TO THE HOSPITAL. PHARMACY WILL DISPENSE MEDICATIONS LISTED ON YOUR MEDICATION LIST TO YOU DURING YOUR ADMISSION IN THE HOSPITAL!    Patients discharged on the day of surgery will not be allowed to drive home.  Someone NEEDS to stay with you for the first 24 hours after anesthesia.   Special Instructions: Bring a copy of your healthcare power of attorney and living will documents the day of surgery if you haven't scanned them before.              Please read over the following fact sheets you were given: IF YOU HAVE QUESTIONS ABOUT YOUR PRE-OP INSTRUCTIONS PLEASE CALL [phone] [facility]   If you received a COVID test during your pre-op visit  it is requested that you wear a mask when out in public, stay away from anyone that may not be feeling well and notify your surgeon if you develop symptoms. If you test positive for Covid or have been in contact with anyone that has tested positive in the last 10 days please notify you surgeon.     [facility] - Preparing for Surgery Before surgery, you can play an important role.  Because skin is not sterile, your skin needs to be as free of germs as possible.  You can reduce the number of germs on your skin by washing with CHG (chlorahexidine gluconate) soap before surgery.  CHG is an antiseptic cleaner which kills germs and bonds with the skin to continue killing germs even after washing. Please DO NOT use if you have an allergy to CHG or antibacterial soaps.  If your skin becomes reddened/irritated stop using the CHG and inform your nurse when you arrive at Short Stay. Do not shave (including legs and underarms) for at least 48 hours prior to the first CHG shower.  You may shave your face/neck.  Please follow these instructions carefully:  1.  Shower with CHG Soap the night before surgery and the  morning of surgery.  2.  If you choose to wash your hair, wash your hair first as usual with your normal  shampoo.  3.  After you shampoo, rinse your hair and body thoroughly to remove the shampoo.                             4.  Use CHG as you would any other liquid soap.  You can apply chg directly to the skin and wash.  Gently with a scrungie or clean washcloth.  5.  Apply the CHG Soap to your body ONLY FROM THE NECK DOWN.   Do   not use  on face/ open                           Wound or open sores. Avoid contact with eyes, ears mouth and   genitals (private parts).                       Wash face,  Genitals (private parts) with your normal soap.             6.  Wash thoroughly, paying special attention to the area where your    surgery  will be performed.  7.  Thoroughly rinse your body with warm water from the neck down.  8.  DO NOT shower/wash with your normal soap after using and rinsing off the CHG Soap.                9.  Pat yourself dry with a clean towel.            10.  Wear clean pajamas.            11.  Place clean sheets on your bed the night of your first shower and do not  sleep  with pets. Day of Surgery : Do not apply any lotions/deodorants the morning of surgery.  Please wear clean clothes to the hospital/surgery center.  FAILURE TO FOLLOW THESE INSTRUCTIONS MAY RESULT IN THE CANCELLATION OF YOUR SURGERY  PATIENT SIGNATURE_________________________________  NURSE SIGNATURE__________________________________  ________________________________________________________________________

## 2023-03-08 NOTE — H&P (View-Only) (Signed)
REFERRING PHYSICIAN: Redmon, Lanice Schwab, PA  PROVIDER: Anthoney Sheppard Myra Rude, MD   Chief Complaint: New Consultation (Primary hyperparathyroidism)  History of Present Illness:  Patient is referred by Dr. Ocie Cornfield for surgical evaluation and management of primary hyperparathyroidism. Patient had been noted on routine laboratory studies to have an elevated serum calcium level. Her most elevated level was in 2023 at 12.8. Repeat level in May 2024 remained elevated at 10.8. Intact PTH level was elevated at 69. Vitamin D levels were normal. Patient was referred and underwent a nuclear medicine parathyroid scan on November 02, 2022. This localized a right inferior parathyroid adenoma. Patient has not had any other imaging studies. Patient does note significant fatigue. She has been diagnosed with osteopenia. She denies bone and joint discomfort. She denies nephrolithiasis. She has had no recent fractures.  Patient does have a history of TIA. She was evaluated by neurology and cardiology. She remains on a baby aspirin daily.  Patient also relates a family history of factor V deficiency. Patient denies ever having to be treated perioperatively with DDAVP or to be anticoagulated.  Review of Systems: A complete review of systems was obtained from the patient. I have reviewed this information and discussed as appropriate with the patient. See HPI as well for other ROS.  Review of Systems  Constitutional: Positive for malaise/fatigue.  HENT: Negative.  Eyes: Negative.  Respiratory: Negative.  Cardiovascular: Negative.  Gastrointestinal: Negative.  Genitourinary: Negative.  Musculoskeletal: Negative.  Skin: Negative.  Neurological: Negative.  Endo/Heme/Allergies: Negative.  Psychiatric/Behavioral: Negative.    Medical History: Past Medical History:  Diagnosis Date  Anxiety  History of stroke   Patient Active Problem List  Diagnosis  Primary hyperparathyroidism (CMS/HHS-HCC)    History reviewed. No pertinent surgical history.   No Known Allergies  Current Outpatient Medications on File Prior to Visit  Medication Sig Dispense Refill  ascorbic acid, vitamin C, (VITAMIN C) 500 MG tablet 1 tablet  aspirin 81 MG EC tablet Take by mouth  cetirizine (ZYRTEC) 10 MG tablet Take 10 mg by mouth once daily  clonazePAM (KLONOPIN) 1 MG tablet 1/2 to 1 tablet Orally at bedtime as needed for sleep  ibandronate (BONIVA) 150 mg tablet  omega-3 fatty acids-fish oil 300-1,000 mg capsule Take 1 tablet by mouth once daily  simvastatin (ZOCOR) 40 MG tablet Take 1 tablet by mouth every evening   No current facility-administered medications on file prior to visit.   Family History  Problem Relation Age of Onset  Skin cancer Sister  Deep vein thrombosis (DVT or abnormal blood clot formation) Brother    Social History   Tobacco Use  Smoking Status Never  Smokeless Tobacco Never    Social History   Socioeconomic History  Marital status: Married  Tobacco Use  Smoking status: Never  Smokeless tobacco: Never  Vaping Use  Vaping status: Never Used  Substance and Sexual Activity  Alcohol use: Yes  Drug use: Never   Objective:   Vitals:  BP: 116/78  Pulse: 70  Temp: 36.7 C (98 F)  SpO2: 97%  Weight: 63.6 kg (140 lb 3.2 oz)  Height: 168.9 cm (5' 6.5")  PainSc: 0-No pain   Body mass index is 22.29 kg/m.  Physical Exam   GENERAL APPEARANCE Comfortable, no acute issues Development: normal Gross deformities: none  SKIN Rash, lesions, ulcers: none Induration, erythema: none Nodules: none palpable  EYES Conjunctiva and lids: normal Pupils: equal and reactive  EARS, NOSE, MOUTH, THROAT External ears: no lesion  or deformity External nose: no lesion or deformity Hearing: grossly normal  NECK Symmetric: yes Trachea: midline Thyroid: no palpable nodules in the thyroid bed  ABDOMEN Not assessed  GENITOURINARY/RECTAL Not  assessed  MUSCULOSKELETAL Station and gait: normal Digits and nails: no clubbing or cyanosis Muscle strength: grossly normal all extremities Range of motion: grossly normal all extremities Deformity: none  LYMPHATIC Cervical: none palpable Supraclavicular: none palpable  PSYCHIATRIC Oriented to person, place, and time: yes Mood and affect: normal for situation Judgment and insight: appropriate for situation   Assessment and Plan:   Primary hyperparathyroidism (CMS/HHS-HCC)  Patient is referred by her endocrinologist, Dr. Ocie Cornfield, for surgical evaluation and management of primary hyperparathyroidism.  Patient provided with a copy of "Parathyroid Surgery: Treatment for Your Parathyroid Gland Problem", published by [employer], 12 pages. Book reviewed and explained to patient during visit today.  Today we reviewed her clinical history. We reviewed her laboratory studies and her imaging study. Patient appears to have a right inferior parathyroid adenoma. I would like to obtain an ultrasound examination of the neck in hopes of confirming the location of the adenoma and to rule out any concurrent thyroid disease.  Given the patient's history of TIA, we will also check with her neurologist for perioperative clearance. I would like to know whether or not she may discontinue the aspirin around the time of surgery. We will also ask for cardiac clearance from her cardiologist.  Patient relates a family history of factor V deficiency. We will check with her primary care provider regarding whether or not the patient will need any medical treatment related to factor V deficiency around the time of surgery.  Patient has several family commitments and travel plans over the next couple of months. It is likely that she will not be able to schedule a date for her surgery until October 2024.  Today we did discuss parathyroid surgery. I provided her with written literature on parathyroid surgery to  review at home. We discussed the size and location of the surgical incision. We discussed the risk and benefits of the procedure including the risk of recurrent laryngeal nerve injury. We discussed the postoperative recovery. We discussed the potential for a second gland adenoma. The patient understands and wishes to proceed with surgery at sometime in the near future.   Darnell Level, MD [facility] practice Office: [phone]

## 2023-03-09 ENCOUNTER — Other Ambulatory Visit: Payer: Self-pay

## 2023-03-09 ENCOUNTER — Encounter (HOSPITAL_COMMUNITY)
Admission: RE | Admit: 2023-03-09 | Discharge: 2023-03-09 | Disposition: A | Discharge status: Home / Self Care | Payer: Medicare Other | Source: Ambulatory Visit | Attending: Surgery | Admitting: Surgery

## 2023-03-09 ENCOUNTER — Encounter (HOSPITAL_COMMUNITY): Payer: Self-pay

## 2023-03-09 VITALS — BP 122/85 | HR 51 | Temp 97.8°F | Resp 16 | Ht 67.0 in | Wt 140.0 lb

## 2023-03-09 DIAGNOSIS — E210 Primary hyperparathyroidism: Secondary | ICD-10-CM | POA: Insufficient documentation

## 2023-03-09 DIAGNOSIS — I639 Cerebral infarction, unspecified: Secondary | ICD-10-CM

## 2023-03-09 DIAGNOSIS — Z01812 Encounter for preprocedural laboratory examination: Secondary | ICD-10-CM | POA: Diagnosis not present

## 2023-03-09 DIAGNOSIS — D6851 Activated protein C resistance: Secondary | ICD-10-CM | POA: Diagnosis not present

## 2023-03-09 DIAGNOSIS — Z8673 Personal history of transient ischemic attack (TIA), and cerebral infarction without residual deficits: Secondary | ICD-10-CM | POA: Diagnosis not present

## 2023-03-09 DIAGNOSIS — Z7982 Long term (current) use of aspirin: Secondary | ICD-10-CM | POA: Insufficient documentation

## 2023-03-09 DIAGNOSIS — F419 Anxiety disorder, unspecified: Secondary | ICD-10-CM | POA: Insufficient documentation

## 2023-03-09 HISTORY — DX: Presence of other cardiac implants and grafts: Z95.818

## 2023-03-09 HISTORY — DX: Presence of automatic (implantable) cardiac defibrillator: Z95.810

## 2023-03-09 LAB — BASIC METABOLIC PANEL
Anion gap: 10 (ref 5–15)
BUN: 20 mg/dL (ref 8–23)
CO2: 25 mmol/L (ref 22–32)
Calcium: 11.1 mg/dL — ABNORMAL HIGH (ref 8.9–10.3)
Chloride: 105 mmol/L (ref 98–111)
Creatinine, Ser: 0.8 mg/dL (ref 0.44–1.00)
GFR, Estimated: >60 mL/min (ref >60)
Glucose, Bld: 89 mg/dL (ref 70–99)
Potassium: 4.7 mmol/L (ref 3.5–5.1)
Sodium: 140 mmol/L (ref 135–145)

## 2023-03-09 LAB — CBC
HCT: 46.9 % — ABNORMAL HIGH (ref 36.0–46.0)
Hemoglobin: 15.3 g/dL — ABNORMAL HIGH (ref 12.0–15.0)
MCH: 30.9 pg (ref 26.0–34.0)
MCHC: 32.6 g/dL (ref 30.0–36.0)
MCV: 94.7 fL (ref 80.0–100.0)
Platelets: 218 10*3/uL (ref 150–400)
RBC: 4.95 MIL/uL (ref 3.87–5.11)
RDW: 13.9 % (ref 11.5–15.5)
WBC: 6.8 10*3/uL (ref 4.0–10.5)
nRBC: 0 % (ref 0.0–0.2)

## 2023-03-09 NOTE — Plan of Care (Signed)
[facility] Tonsillectomy/Adenoidectomy, Postoperative PEDS care plan entered in error.

## 2023-03-11 ENCOUNTER — Encounter (HOSPITAL_COMMUNITY): Payer: Self-pay | Admitting: Surgery

## 2023-03-11 DIAGNOSIS — E210 Primary hyperparathyroidism: Secondary | ICD-10-CM | POA: Diagnosis present

## 2023-03-12 ENCOUNTER — Encounter (HOSPITAL_COMMUNITY): Payer: Self-pay

## 2023-03-12 NOTE — Progress Notes (Signed)
Case: [id number] Date/Time: 03/15/23 0914   Procedure: RIGHT INFERIOR PARATHYROIDECTOMY (Right)   Anesthesia type: General   Pre-op diagnosis: PRIMARY HYPERPARATHYROIDISM   Location: [facility] ROOM 03 / [facility]   Surgeons: Darnell Level, MD       DISCUSSION: Jacqueline Bruce is a 72 yo female who presents to PAT prior to surgery above. PMH of CVA (03/2019), s/p ILR, Factor V Leiden, anxiety.  Patient was admitted for CVA w/u in 03/2019. Diagnosed with cryptogenic CVA. Followed up with Cardiology to have ILR placed. No events noted. She plans to have this removed soon. Cardiac clearance received:   "Preoperative Cardiovascular Risk Assessment:   According to the Revised Cardiac Risk Index (RCRI), her Perioperative Risk of Major Cardiac Event is (%): 0.9. Her Functional Capacity in METs is: 6.79 according to the [facility] Activity Status Index (DASI). Therefore, based on ACC/AHA guidelines, patient would be at acceptable risk for the planned procedure without further cardiovascular testing.   Aspirin prescribed by a noncardiology provider. Recommendations for holding Aspirin prior to surgery should come from managing provider (neurology)."   She has also had f/u with Neurology but now only sees them prn. She no longer takes Plavix. Per Neurology NP in telephone visit on 12/28/22: "Received clearance request for patient to undergo parathyroid surgery, not yet scheduled, by [facility] surgery.  History of stroke 03/2019 currently on aspirin therapy.  As long as patient has not had any new stroke/TIAs or symptoms over the past 6 months, can proceed with surgical procedure with holding aspirin for 3 to 5 days prior with small but acceptable risk of recurrent stroke while off therapy and recommend restarting immediately after or once stable."  VS: BP 122/85   Pulse (!) 51   Temp 36.6 C (Oral)   Resp 16   Ht 5\' 7"  (1.702 m)   Wt 63.5 kg   SpO2 100%   BMI 21.93 kg/m   PROVIDERS: Milus Height,  PA   LABS: Labs reviewed: Acceptable for surgery. (all labs ordered are listed, but only abnormal results are displayed)  Labs Reviewed  BASIC METABOLIC PANEL - Abnormal; Notable for the following components:      Result Value   Calcium 11.1 (*)    All other components within normal limits  CBC - Abnormal; Notable for the following components:   Hemoglobin 15.3 (*)    HCT 46.9 (*)    All other components within normal limits     IMAGES:   EKG:   CV:  ILR report 03/04/23:  ILR summary report received. Battery status OK. Normal device function. No new symptom, tachy, brady, or pause episodes. No new AF episodes. Monthly summary reports and ROV/PRN.   Echo 04/15/2019:  IMPRESSIONS     1. Left ventricular ejection fraction, by visual estimation, is 60 to  65%. The left ventricle has normal function. There is no left ventricular  hypertrophy.   2. The left ventricle has no regional wall motion abnormalities.   3. Global right ventricle has normal systolic function.The right  ventricular size is normal. No increase in right ventricular wall  thickness.   4. Left atrial size was normal.   5. Right atrial size was normal.   6. The mitral valve is normal in structure. No evidence of mitral valve  regurgitation. No evidence of mitral stenosis.   7. The tricuspid valve is normal in structure. Tricuspid valve  regurgitation is not demonstrated.   8. The aortic valve is normal in structure. Aortic  valve regurgitation is  not visualized. No evidence of aortic valve sclerosis or stenosis.   9. The pulmonic valve was normal in structure. Pulmonic valve  regurgitation is not visualized.  10. The inferior vena cava is normal in size with greater than 50%  respiratory variability, suggesting right atrial pressure of 3 mmHg.   Past Medical History:  Diagnosis Date   Acute CVA (cerebrovascular accident) (HCC) 04/16/2019   AICD (automatic cardioverter/defibrillator) present     Allergy    Anxiety 04/15/2019   Bradycardia with 41-50 beats per minute 04/15/2019   Factor V Leiden mutation (HCC)    Osteopenia 04/15/2019   Slurred speech 04/15/2019   TIA due to embolism (HCC) 04/15/2019    Past Surgical History:  Procedure Laterality Date   BUBBLE STUDY  04/25/2019   Procedure: BUBBLE STUDY;  Surgeon: Pricilla Riffle, MD;  Location: [facility] ENDOSCOPY;  Service: Cardiovascular;;   TEE WITHOUT CARDIOVERSION N/A 04/25/2019   Procedure: TRANSESOPHAGEAL ECHOCARDIOGRAM (TEE);  Surgeon: Pricilla Riffle, MD;  Location: [facility] ENDOSCOPY;  Service: Cardiovascular;  Laterality: N/A;    MEDICATIONS:  Ascorbic Acid (VITAMIN C) 1000 MG tablet   aspirin EC 81 MG tablet   cetirizine (ZYRTEC ALLERGY) 10 MG tablet   cholecalciferol (VITAMIN D3) 25 MCG (1000 UNIT) tablet   clonazePAM (KLONOPIN) 1 MG tablet   ferrous sulfate 325 (65 FE) MG tablet   ibandronate (BONIVA) 150 MG tablet   ibuprofen (ADVIL) 200 MG tablet   ketorolac (ACULAR) 0.5 % ophthalmic solution   Multiple Vitamins-Minerals (PRESERVISION AREDS 2 PO)   Omega-3 Fatty Acids (FISH OIL CONCENTRATE PO)   simvastatin (ZOCOR) 40 MG tablet   No current facility-administered medications for this encounter.   Marcille Blanco [facility] Short Stay/Anesthesiology [facility] Phone [phone] 03/12/2023 9:26 AM

## 2023-03-12 NOTE — Anesthesia Preprocedure Evaluation (Signed)
Anesthesia Evaluation  Patient identified by MRN, date of birth, ID band Patient awake    Reviewed: Allergy & Precautions, NPO status , Patient's Chart, lab work & pertinent test results  History of Anesthesia Complications Negative for: history of anesthetic complications  Airway Mallampati: II  TM Distance: >3 FB Neck ROM: Full    Dental no notable dental hx.    Pulmonary neg pulmonary ROS   Pulmonary exam normal        Cardiovascular Normal cardiovascular exam     Neuro/Psych   Anxiety     CVA    GI/Hepatic negative GI ROS, Neg liver ROS,,,  Endo/Other  negative endocrine ROS    Renal/GU negative Renal ROS     Musculoskeletal negative musculoskeletal ROS (+)    Abdominal   Peds  Hematology Factor 5 Leiden mutation, heterozygous   Anesthesia Other Findings Day of surgery medications reviewed with patient.  Reproductive/Obstetrics                             Anesthesia Physical Anesthesia Plan  ASA: 3  Anesthesia Plan: General   Post-op Pain Management: Tylenol PO (pre-op)*   Induction: Intravenous  PONV Risk Score and Plan: 3 and Treatment may vary due to age or medical condition, Ondansetron and Dexamethasone  Airway Management Planned: Oral ETT  Additional Equipment: None  Intra-op Plan:   Post-operative Plan: Extubation in OR  Informed Consent: I have reviewed the patients History and Physical, chart, labs and discussed the procedure including the risks, benefits and alternatives for the proposed anesthesia with the patient or authorized representative who has indicated his/her understanding and acceptance.     Dental advisory given  Plan Discussed with: CRNA  Anesthesia Plan Comments: (See PAT note from 10/18 by K Gekas PA-C )        Anesthesia Quick Evaluation

## 2023-03-15 ENCOUNTER — Ambulatory Visit (HOSPITAL_COMMUNITY): Payer: Medicare Other | Admitting: Medical

## 2023-03-15 ENCOUNTER — Other Ambulatory Visit: Payer: Self-pay

## 2023-03-15 ENCOUNTER — Ambulatory Visit (HOSPITAL_BASED_OUTPATIENT_CLINIC_OR_DEPARTMENT_OTHER): Payer: Medicare Other | Admitting: Anesthesiology

## 2023-03-15 ENCOUNTER — Encounter (HOSPITAL_COMMUNITY)
Admission: RE | Disposition: A | Discharge status: Home / Self Care | Payer: Self-pay | Source: Ambulatory Visit | Attending: Surgery

## 2023-03-15 ENCOUNTER — Encounter (HOSPITAL_COMMUNITY): Payer: Self-pay | Admitting: Surgery

## 2023-03-15 ENCOUNTER — Ambulatory Visit (HOSPITAL_COMMUNITY)
Admission: RE | Admit: 2023-03-15 | Discharge: 2023-03-15 | Disposition: A | Discharge status: Home / Self Care | Payer: Medicare Other | Source: Ambulatory Visit | Attending: Surgery | Admitting: Surgery

## 2023-03-15 DIAGNOSIS — E210 Primary hyperparathyroidism: Secondary | ICD-10-CM | POA: Diagnosis not present

## 2023-03-15 DIAGNOSIS — Z8673 Personal history of transient ischemic attack (TIA), and cerebral infarction without residual deficits: Secondary | ICD-10-CM | POA: Diagnosis not present

## 2023-03-15 DIAGNOSIS — Z7982 Long term (current) use of aspirin: Secondary | ICD-10-CM | POA: Insufficient documentation

## 2023-03-15 DIAGNOSIS — M8580 Other specified disorders of bone density and structure, unspecified site: Secondary | ICD-10-CM | POA: Diagnosis not present

## 2023-03-15 DIAGNOSIS — F419 Anxiety disorder, unspecified: Secondary | ICD-10-CM | POA: Insufficient documentation

## 2023-03-15 DIAGNOSIS — D351 Benign neoplasm of parathyroid gland: Secondary | ICD-10-CM | POA: Diagnosis not present

## 2023-03-15 DIAGNOSIS — E213 Hyperparathyroidism, unspecified: Secondary | ICD-10-CM | POA: Diagnosis not present

## 2023-03-15 HISTORY — PX: PARATHYROIDECTOMY: SHX19

## 2023-03-15 SURGERY — PARATHYROIDECTOMY
Anesthesia: General | Laterality: Right

## 2023-03-15 MED ORDER — OXYCODONE HCL 5 MG PO TABS: 5.0000 mg | ORAL_TABLET | Freq: Once | ORAL | Status: DC | PRN

## 2023-03-15 MED ORDER — CEFAZOLIN SODIUM-DEXTROSE 2-4 GM/100ML-% IV SOLN
2.0000 g | INTRAVENOUS | Status: AC
  Administered 2023-03-15: 2 g via INTRAVENOUS
  Filled 2023-03-15: qty 100

## 2023-03-15 MED ORDER — AMISULPRIDE (ANTIEMETIC) 5 MG/2ML IV SOLN: 10.0000 mg | Freq: Once | INTRAVENOUS | Status: DC | PRN

## 2023-03-15 MED ORDER — SUGAMMADEX SODIUM 200 MG/2ML IV SOLN
INTRAVENOUS | Status: DC | PRN
  Administered 2023-03-15: 140 mg via INTRAVENOUS

## 2023-03-15 MED ORDER — ORAL CARE MOUTH RINSE: 15.0000 mL | Freq: Once | OROMUCOSAL | Status: AC

## 2023-03-15 MED ORDER — BUPIVACAINE HCL 0.25 % IJ SOLN
INTRAMUSCULAR | Status: AC
  Filled 2023-03-15: qty 1

## 2023-03-15 MED ORDER — LIDOCAINE 2% (20 MG/ML) 5 ML SYRINGE
INTRAMUSCULAR | Status: DC | PRN
  Administered 2023-03-15: 60 mg via INTRAVENOUS

## 2023-03-15 MED ORDER — FENTANYL CITRATE PF 50 MCG/ML IJ SOSY
PREFILLED_SYRINGE | INTRAMUSCULAR | Status: AC
  Filled 2023-03-15: qty 1

## 2023-03-15 MED ORDER — FENTANYL CITRATE (PF) 100 MCG/2ML IJ SOLN
INTRAMUSCULAR | Status: AC
  Filled 2023-03-15: qty 2

## 2023-03-15 MED ORDER — BUPIVACAINE HCL 0.25 % IJ SOLN
INTRAMUSCULAR | Status: DC | PRN
  Administered 2023-03-15: 10 mL

## 2023-03-15 MED ORDER — PROPOFOL 10 MG/ML IV BOLUS
INTRAVENOUS | Status: DC | PRN
  Administered 2023-03-15: 130 mg via INTRAVENOUS

## 2023-03-15 MED ORDER — CHLORHEXIDINE GLUCONATE CLOTH 2 % EX PADS: 6.0000 | MEDICATED_PAD | Freq: Once | CUTANEOUS | Status: DC

## 2023-03-15 MED ORDER — FENTANYL CITRATE (PF) 100 MCG/2ML IJ SOLN
INTRAMUSCULAR | Status: DC | PRN
  Administered 2023-03-15 (×2): 50 ug via INTRAVENOUS

## 2023-03-15 MED ORDER — EPHEDRINE SULFATE-NACL 50-0.9 MG/10ML-% IV SOSY
PREFILLED_SYRINGE | INTRAVENOUS | Status: DC | PRN
  Administered 2023-03-15 (×2): 5 mg via INTRAVENOUS

## 2023-03-15 MED ORDER — FENTANYL CITRATE PF 50 MCG/ML IJ SOSY
25.0000 ug | PREFILLED_SYRINGE | INTRAMUSCULAR | Status: DC | PRN
  Administered 2023-03-15: 50 ug via INTRAVENOUS

## 2023-03-15 MED ORDER — 0.9 % SODIUM CHLORIDE (POUR BTL) OPTIME
TOPICAL | Status: DC | PRN
  Administered 2023-03-15: 1000 mL

## 2023-03-15 MED ORDER — TRAMADOL HCL 50 MG PO TABS: 50.0000 mg | ORAL_TABLET | Freq: Four times a day (QID) | ORAL | 0 refills | Status: DC | PRN

## 2023-03-15 MED ORDER — ROCURONIUM BROMIDE 10 MG/ML (PF) SYRINGE
PREFILLED_SYRINGE | INTRAVENOUS | Status: AC
  Filled 2023-03-15: qty 10

## 2023-03-15 MED ORDER — LIDOCAINE HCL (PF) 2 % IJ SOLN
INTRAMUSCULAR | Status: AC
  Filled 2023-03-15: qty 5

## 2023-03-15 MED ORDER — DEXAMETHASONE SODIUM PHOSPHATE 10 MG/ML IJ SOLN
INTRAMUSCULAR | Status: DC | PRN
  Administered 2023-03-15: 6 mg via INTRAVENOUS

## 2023-03-15 MED ORDER — OXYCODONE HCL 5 MG/5ML PO SOLN: 5.0000 mg | Freq: Once | ORAL | Status: DC | PRN

## 2023-03-15 MED ORDER — ONDANSETRON HCL 4 MG/2ML IJ SOLN
INTRAMUSCULAR | Status: AC
  Filled 2023-03-15: qty 2

## 2023-03-15 MED ORDER — HEMOSTATIC AGENTS (NO CHARGE) OPTIME
TOPICAL | Status: DC | PRN
  Administered 2023-03-15: 1 via TOPICAL

## 2023-03-15 MED ORDER — EPHEDRINE 5 MG/ML INJ
INTRAVENOUS | Status: AC
  Filled 2023-03-15: qty 5

## 2023-03-15 MED ORDER — CHLORHEXIDINE GLUCONATE 0.12 % MT SOLN
15.0000 mL | Freq: Once | OROMUCOSAL | Status: AC
  Administered 2023-03-15: 15 mL via OROMUCOSAL

## 2023-03-15 MED ORDER — ROCURONIUM BROMIDE 10 MG/ML (PF) SYRINGE
PREFILLED_SYRINGE | INTRAVENOUS | Status: DC | PRN
  Administered 2023-03-15: 40 mg via INTRAVENOUS

## 2023-03-15 MED ORDER — DEXAMETHASONE SODIUM PHOSPHATE 10 MG/ML IJ SOLN
INTRAMUSCULAR | Status: AC
  Filled 2023-03-15: qty 1

## 2023-03-15 MED ORDER — ACETAMINOPHEN 10 MG/ML IV SOLN: 1000.0000 mg | Freq: Once | INTRAVENOUS | Status: DC | PRN

## 2023-03-15 MED ORDER — ONDANSETRON HCL 4 MG/2ML IJ SOLN
INTRAMUSCULAR | Status: DC | PRN
  Administered 2023-03-15: 4 mg via INTRAVENOUS

## 2023-03-15 MED ORDER — PROPOFOL 10 MG/ML IV BOLUS
INTRAVENOUS | Status: AC
Start: 2023-03-15 — End: ?
  Filled 2023-03-15: qty 20

## 2023-03-15 MED ORDER — LACTATED RINGERS IV SOLN: INTRAVENOUS | Status: DC | PRN

## 2023-03-15 SURGICAL SUPPLY — 35 items
ADH SKN CLS APL DERMABOND .7 (GAUZE/BANDAGES/DRESSINGS) ×1
APL PRP STRL LF DISP 70% ISPRP (MISCELLANEOUS) ×1
ATTRACTOMAT 16X20 MAGNETIC DRP (DRAPES) ×1 IMPLANT
BAG COUNTER SPONGE SURGICOUNT (BAG) ×1 IMPLANT
BAG SPNG CNTER NS LX DISP (BAG) ×1
BLADE SURG 15 STRL LF DISP TIS (BLADE) ×1 IMPLANT
BLADE SURG 15 STRL SS (BLADE) ×1
CHLORAPREP W/TINT 26 (MISCELLANEOUS) ×1 IMPLANT
CLIP TI MEDIUM 6 (CLIP) ×2 IMPLANT
CLIP TI WIDE RED SMALL 6 (CLIP) ×2 IMPLANT
COVER SURGICAL LIGHT HANDLE (MISCELLANEOUS) ×1 IMPLANT
DERMABOND ADVANCED .7 DNX12 (GAUZE/BANDAGES/DRESSINGS) ×1 IMPLANT
DRAPE LAPAROTOMY T 98X78 PEDS (DRAPES) ×1 IMPLANT
DRAPE UTILITY XL STRL (DRAPES) ×1 IMPLANT
ELECT REM PT RETURN 15FT ADLT (MISCELLANEOUS) ×1 IMPLANT
GAUZE 4X4 16PLY ~~LOC~~+RFID DBL (SPONGE) ×1 IMPLANT
GLOVE SURG ORTHO 8.0 STRL STRW (GLOVE) ×1 IMPLANT
GOWN STRL REUS W/ TWL XL LVL3 (GOWN DISPOSABLE) ×3 IMPLANT
GOWN STRL REUS W/TWL XL LVL3 (GOWN DISPOSABLE) ×3
HEMOSTAT SURGICEL 2X4 FIBR (HEMOSTASIS) ×1 IMPLANT
ILLUMINATOR WAVEGUIDE N/F (MISCELLANEOUS) IMPLANT
KIT BASIN OR (CUSTOM PROCEDURE TRAY) ×1 IMPLANT
KIT TURNOVER KIT A (KITS) IMPLANT
NDL HYPO 22X1.5 SAFETY MO (MISCELLANEOUS) ×1 IMPLANT
NEEDLE HYPO 22X1.5 SAFETY MO (MISCELLANEOUS) ×1
PACK BASIC VI WITH GOWN DISP (CUSTOM PROCEDURE TRAY) ×1 IMPLANT
PENCIL SMOKE EVACUATOR (MISCELLANEOUS) ×1 IMPLANT
SHEARS HARMONIC 9CM CVD (BLADE) IMPLANT
SUT MNCRL AB 4-0 PS2 18 (SUTURE) ×1 IMPLANT
SUT VIC AB 3-0 SH 18 (SUTURE) ×1 IMPLANT
SYR BULB IRRIG 60ML STRL (SYRINGE) ×1 IMPLANT
SYR CONTROL 10ML LL (SYRINGE) ×1 IMPLANT
TOWEL OR 17X26 10 PK STRL BLUE (TOWEL DISPOSABLE) ×1 IMPLANT
TOWEL OR NON WOVEN STRL DISP B (DISPOSABLE) ×1 IMPLANT
TUBING CONNECTING 10 (TUBING) ×1 IMPLANT

## 2023-03-15 NOTE — Transfer of Care (Signed)
Immediate Anesthesia Transfer of Care Note  Patient: Jacqueline Bruce  Procedure(s) Performed: RIGHT INFERIOR PARATHYROIDECTOMY (Right)  Patient Location: PACU  Anesthesia Type:General  Level of Consciousness: drowsy  Airway & Oxygen Therapy: Patient Spontanous Breathing and Patient connected to face mask oxygen  Post-op Assessment: Report given to RN and Post -op Vital signs reviewed and stable  Post vital signs: Reviewed and stable  Last Vitals:  Vitals Value Taken Time  BP 130/70 03/15/23 1015  Temp    Pulse 63 03/15/23 1016  Resp 18 03/15/23 1016  SpO2 100 % 03/15/23 1016  Vitals shown include unfiled device data.  Last Pain:  Vitals:   03/15/23 0832  TempSrc:   PainSc: 0-No pain         Complications: No notable events documented.

## 2023-03-15 NOTE — Plan of Care (Signed)
[facility] Tonsillectomy/Adenoidectomy, Postoperative PEDS care plan entered in error.

## 2023-03-15 NOTE — Interval H&P Note (Signed)
History and Physical Interval Note:  03/15/2023 8:38 AM  Jacqueline Bruce  has presented today for surgery, with the diagnosis of PRIMARY HYPERPARATHYROIDISM.  The various methods of treatment have been discussed with the patient and family. After consideration of risks, benefits and other options for treatment, the patient has consented to    Procedure(s): RIGHT INFERIOR PARATHYROIDECTOMY (Right) as a surgical intervention.    The patient's history has been reviewed, patient examined, no change in status, stable for surgery.  I have reviewed the patient's chart and labs.  Questions were answered to the patient's satisfaction.    Darnell Level, MD [facility] practice Office: [phone]   Darnell Level

## 2023-03-15 NOTE — Discharge Instructions (Addendum)

## 2023-03-15 NOTE — Anesthesia Procedure Notes (Signed)
Procedure Name: Intubation Date/Time: 03/15/2023 9:10 AM  Performed by: Florene Route, CRNAPre-anesthesia Checklist: Patient identified, Emergency Drugs available, Suction available and Patient being monitored Patient Re-evaluated:Patient Re-evaluated prior to induction Oxygen Delivery Method: Circle system utilized Preoxygenation: Pre-oxygenation with 100% oxygen Induction Type: IV induction Ventilation: Mask ventilation without difficulty Laryngoscope Size: Miller and 2 Grade View: Grade I Tube type: Reinforced Tube size: 7.0 mm Number of attempts: 1 Airway Equipment and Method: Stylet Placement Confirmation: ETT inserted through vocal cords under direct vision, positive ETCO2 and breath sounds checked- equal and bilateral Secured at: 22 cm Tube secured with: Tape Dental Injury: Teeth and Oropharynx as per pre-operative assessment

## 2023-03-15 NOTE — Op Note (Signed)
OPERATIVE REPORT - PARATHYROIDECTOMY  Preoperative diagnosis: Primary hyperparathyroidism  Postop diagnosis: Same  Procedure: Right inferior minimally invasive parathyroidectomy  Surgeon:  Darnell Level, MD  Anesthesia: General endotracheal  Estimated blood loss: Minimal  Preparation: ChloraPrep  Indications: Patient is referred by Dr. Ocie Cornfield for surgical evaluation and management of primary hyperparathyroidism. Patient had been noted on routine laboratory studies to have an elevated serum calcium level. Her most elevated level was in 2023 at 12.8. Repeat level in May 2024 remained elevated at 10.8. Intact PTH level was elevated at 69. Vitamin D levels were normal. Patient was referred and underwent a nuclear medicine parathyroid scan on November 02, 2022. This localized a right inferior parathyroid adenoma. Patient has not had any other imaging studies. Patient does note significant fatigue. She has been diagnosed with osteopenia. She denies bone and joint discomfort. She denies nephrolithiasis. She has had no recent fractures.   Procedure: The patient was prepared in the pre-operative holding area. The patient was brought to the operating room and placed in a supine position on the operating room table. Following administration of general anesthesia, the patient was positioned and then prepped and draped in the usual strict aseptic fashion. After ascertaining that an adequate level of anesthesia been achieved, a neck incision was made with a #15 blade. Dissection was carried through subcutaneous tissues and platysma. Hemostasis was obtained with the electrocautery. Skin flaps were developed circumferentially and a Weitlander retractor was placed for exposure.  Strap muscles were incised in the midline. Strap muscles were reflected laterally exposing the thyroid lobe. With gentle blunt dissection the thyroid lobe was mobilized.  Dissection was carried posteriorly and an enlarged parathyroid  gland was identified. It was gently mobilized. Vascular structures were divided between small ligaclips. Care was taken to avoid the recurrent laryngeal nerve. The parathyroid gland was completely excised. It was submitted to pathology where frozen section confirmed hypercellular parathyroid tissue consistent with adenoma.  Neck was irrigated with warm saline and good hemostasis was noted. Fibrillar was placed in the operative field. Strap muscles were approximated in the midline with interrupted 3-0 Vicryl sutures. Platysma was closed with interrupted 3-0 Vicryl sutures. Marcaine was infiltrated circumferentially. Skin was closed with a running 4-0 Monocryl subcuticular suture. Wound was washed and dried and Dermabond was applied. Patient was awakened from anesthesia and brought to the recovery room. The patient tolerated the procedure well.   Darnell Level, MD [facility] Office: [phone]

## 2023-03-15 NOTE — Anesthesia Postprocedure Evaluation (Signed)
Anesthesia Post Note  Patient: Jacqueline Bruce  Procedure(s) Performed: RIGHT INFERIOR PARATHYROIDECTOMY (Right)     Patient location during evaluation: PACU Anesthesia Type: General Level of consciousness: awake and alert Pain management: pain level controlled Vital Signs Assessment: post-procedure vital signs reviewed and stable Respiratory status: spontaneous breathing, nonlabored ventilation and respiratory function stable Cardiovascular status: blood pressure returned to baseline Postop Assessment: no apparent nausea or vomiting Anesthetic complications: no   No notable events documented.  Last Vitals:  Vitals:   03/15/23 1100 03/15/23 1109  BP: 112/61 123/66  Pulse: (!) 51 (!) 50  Resp: 13 15  Temp:    SpO2: 98% 98%    Last Pain:  Vitals:   03/15/23 1109  TempSrc:   PainSc: 4                  Shanda Howells

## 2023-03-16 ENCOUNTER — Encounter (HOSPITAL_COMMUNITY): Payer: Self-pay | Admitting: Surgery

## 2023-03-19 LAB — SURGICAL PATHOLOGY

## 2023-03-20 NOTE — Progress Notes (Signed)
Carelink Summary Report / Loop Recorder 

## 2023-04-09 ENCOUNTER — Ambulatory Visit: Payer: Medicare Other

## 2023-04-09 DIAGNOSIS — Z1231 Encounter for screening mammogram for malignant neoplasm of breast: Secondary | ICD-10-CM | POA: Diagnosis not present

## 2023-04-09 DIAGNOSIS — I639 Cerebral infarction, unspecified: Secondary | ICD-10-CM

## 2023-04-09 LAB — CUP PACEART REMOTE DEVICE CHECK
Date Time Interrogation Session: 20241115230131
Implantable Pulse Generator Implant Date: 20210107

## 2023-04-11 DIAGNOSIS — E210 Primary hyperparathyroidism: Secondary | ICD-10-CM | POA: Diagnosis not present

## 2023-04-11 DIAGNOSIS — Z9889 Other specified postprocedural states: Secondary | ICD-10-CM | POA: Diagnosis not present

## 2023-04-11 DIAGNOSIS — Z9089 Acquired absence of other organs: Secondary | ICD-10-CM | POA: Diagnosis not present

## 2023-04-13 DIAGNOSIS — E892 Postprocedural hypoparathyroidism: Secondary | ICD-10-CM | POA: Diagnosis not present

## 2023-04-13 DIAGNOSIS — Z0000 Encounter for general adult medical examination without abnormal findings: Secondary | ICD-10-CM | POA: Diagnosis not present

## 2023-04-13 DIAGNOSIS — E7800 Pure hypercholesterolemia, unspecified: Secondary | ICD-10-CM | POA: Diagnosis not present

## 2023-04-13 DIAGNOSIS — M8589 Other specified disorders of bone density and structure, multiple sites: Secondary | ICD-10-CM | POA: Diagnosis not present

## 2023-04-13 DIAGNOSIS — N1831 Chronic kidney disease, stage 3a: Secondary | ICD-10-CM | POA: Diagnosis not present

## 2023-04-13 DIAGNOSIS — G479 Sleep disorder, unspecified: Secondary | ICD-10-CM | POA: Diagnosis not present

## 2023-04-13 DIAGNOSIS — Z8673 Personal history of transient ischemic attack (TIA), and cerebral infarction without residual deficits: Secondary | ICD-10-CM | POA: Diagnosis not present

## 2023-04-13 DIAGNOSIS — I495 Sick sinus syndrome: Secondary | ICD-10-CM | POA: Diagnosis not present

## 2023-04-13 DIAGNOSIS — D6851 Activated protein C resistance: Secondary | ICD-10-CM | POA: Diagnosis not present

## 2023-04-13 DIAGNOSIS — I700 Atherosclerosis of aorta: Secondary | ICD-10-CM | POA: Diagnosis not present

## 2023-04-13 DIAGNOSIS — M1990 Unspecified osteoarthritis, unspecified site: Secondary | ICD-10-CM | POA: Diagnosis not present

## 2023-04-17 DIAGNOSIS — M25511 Pain in right shoulder: Secondary | ICD-10-CM | POA: Diagnosis not present

## 2023-04-17 DIAGNOSIS — E892 Postprocedural hypoparathyroidism: Secondary | ICD-10-CM | POA: Diagnosis not present

## 2023-04-17 DIAGNOSIS — N1831 Chronic kidney disease, stage 3a: Secondary | ICD-10-CM | POA: Diagnosis not present

## 2023-04-17 DIAGNOSIS — Z8639 Personal history of other endocrine, nutritional and metabolic disease: Secondary | ICD-10-CM | POA: Diagnosis not present

## 2023-04-30 ENCOUNTER — Encounter: Payer: Self-pay | Admitting: Podiatry

## 2023-04-30 ENCOUNTER — Ambulatory Visit: Payer: Medicare Other | Admitting: Podiatry

## 2023-04-30 ENCOUNTER — Ambulatory Visit (INDEPENDENT_AMBULATORY_CARE_PROVIDER_SITE_OTHER): Payer: Medicare Other

## 2023-04-30 DIAGNOSIS — M778 Other enthesopathies, not elsewhere classified: Secondary | ICD-10-CM

## 2023-04-30 DIAGNOSIS — S92504A Nondisplaced unspecified fracture of right lesser toe(s), initial encounter for closed fracture: Secondary | ICD-10-CM | POA: Diagnosis not present

## 2023-04-30 NOTE — Patient Instructions (Signed)
Toe Fracture  A toe fracture is a break in one of the toe bones (phalanges). What are the causes? A toe fracture may happen if you: Drop a heavy object on your toe. Stub your toe. Twist your toe. Exercise the same way too much. What increases the risk? Playing contact sports. Having weak bones (osteoporosis). Having a low calcium level. What are the signs or symptoms? The main symptoms are swelling and pain in the toe. You may also have: Bruising. Stiffness. Loss of feeling (numbness). A change in the way the toe looks. Broken bones that poke through the skin. Blood under the toenail. How is this treated? Treatments may include: Taping the broken toe to a toe that is next to it (buddy taping). Wearing a shoe that has a wide, rigid sole to protect the toe and to limit its movement. Wearing a cast. A procedure to move the toe back into place. Surgery. This may be needed if: Pieces of broken bone are out of place. The bone pokes through the skin. Physical therapy exercises to help your toe move better and get stronger. Follow these instructions at home: If you have a shoe that can be taken off: Wear the shoe as told by your doctor. Take it off only as told by your doctor. Check the skin around the shoe every day. Tell your doctor if you see problems. Loosen the shoe if your toes: Tingle. Become numb. Turn cold and blue. Keep the shoe clean and dry. If you have a cast that cannot be taken off: Do not put pressure on any part of the cast until it is fully hardened. Do not stick anything inside the cast to scratch your skin. Check the skin around the cast every day. Tell your doctor if you see problems. You may put lotion on dry skin around the cast. Do not put lotion on the skin under the cast. Keep the cast clean and dry. Bathing Do not take baths, swim, or use a hot tub. Ask your doctor about taking showers. If the shoe or cast is not waterproof: Do not let it get  wet. Cover it with a watertight covering when you take a bath or shower. Activity Use crutches to support your body weight. Do not use your injured foot to support your body weight until your doctor says that you can. Ask your doctor what activities are safe for you during recovery. Avoid activities as told by your doctor. Do exercises as told by your doctor. Driving Ask your doctor if you should avoid driving or using machines while you are taking your medicine. Do not drive while wearing a cast on a foot that you use for driving. Managing pain, stiffness, and swelling  If told, put ice on the injured area. If you have a removable shoe, take it off as told by your doctor. Put ice in a plastic bag. Place a towel between your skin and the bag or between your cast and the bag. Leave the ice on for 20 minutes, 2-3 times a day. If your skin turns bright red, take off the ice right away to prevent skin damage. The risk of damage is higher if you cannot feel pain, heat, or cold. Raise the injured area above the level of your heart while you are sitting or lying down. General instructions If your toe was taped to a toe that is next to it, follow your doctor's instructions for changing the gauze and tape. Change it more often if:  The gauze and tape get wet. If this happens, dry the space between the toes. The gauze and tape are too tight and they cause your toe to become pale or to lose feeling (go numb). If your doctor did not give you a protective shoe, wear sturdy shoes that support your foot. Your shoes should not: Pinch your toes. Fit tightly against your toes. Do not smoke or use any products that contain nicotine or tobacco. These can make it take longer for your bones to heal. If you need help quitting, ask your doctor. Take over-the-counter and prescription medicines only as told by your doctor. Keep all follow-up visits. Your doctor will check your foot to see how it is  healing. Contact a doctor if: Your pain medicine is not helping. You have a fever. You notice a bad smell coming from your cast. Get help right away if: You have numbness in your toe or foot, and it is getting worse. Your toe or your foot tingles. Your toe or your foot gets cold or turns blue. You have redness or swelling in your toe or foot, and it is getting worse. You have very bad pain. This information is not intended to replace advice given to you by your health care provider. Make sure you discuss any questions you have with your health care provider. Document Revised: 05/23/2022 Document Reviewed: 05/23/2022 Elsevier Patient Education  2024 [employer].

## 2023-04-30 NOTE — Progress Notes (Unsigned)
Subjective: Chief Complaint  Patient presents with   Foot Pain    Rm#12 Right foot injury about three months ago feels something pinching her. Pain on occasion when having a massage.   72 year old female presents the office with above concerns.  She states that she injured her right foot about 3 months ago when she bent her toes back.  Originally it was swollen and tender.  But she is getting pain mostly to the second toe at this time and still some swelling to the toe.  She denies any recent treatment or treatment after the injury.  Objective: AAO x3, NAD DP/PT pulses palpable bilaterally, CRT less than 3 seconds There is no area of pinpoint tenderness to the metatarsals, rear foot.  Only her tenderness is along the second toe.  There is a mild edema present.  There is no open lesions present.  Toe is rectus. No pain with calf compression, swelling, warmth, erythema  Assessment: Right second toe fracture  Plan: -All treatment options discussed with the patient including all alternatives, risks, complications.  -X-rays obtained reviewed of the right foot.  Multiple views were obtained.  Radiolucency noted along the second toe consistent with fracture.  Hardware intact from prior surgery. -Discussed buddy splinting of the toes.  Stiffer soled shoes.  Ice to the area for swelling. -Patient encouraged to call the office with any questions, concerns, change in symptoms.   Return if symptoms worsen or fail to improve.  Jacqueline Bruce DPM

## 2023-05-01 ENCOUNTER — Encounter: Payer: Self-pay | Admitting: Internal Medicine

## 2023-05-01 ENCOUNTER — Ambulatory Visit: Payer: Medicare Other | Attending: Internal Medicine | Admitting: Internal Medicine

## 2023-05-01 VITALS — BP 104/66 | HR 48 | Ht 67.0 in | Wt 143.8 lb

## 2023-05-01 DIAGNOSIS — R001 Bradycardia, unspecified: Secondary | ICD-10-CM | POA: Diagnosis not present

## 2023-05-01 NOTE — Patient Instructions (Addendum)
Medication Instructions:  Your physician recommends that you continue on your current medications as directed. Please refer to the Current Medication list given to you today.  *If you need a refill on your cardiac medications before your next appointment, please call your pharmacy*  Lab Work: None ordered.  If you have labs (blood work) drawn today and your tests are completely normal, you will receive your results only by: MyChart Message (if you have MyChart) OR A paper copy in the mail If you have any lab test that is abnormal or we need to change your treatment, we will call you to review the results.  Testing/Procedures: None ordered.  Follow-Up: At [facility], you and your health needs are our priority.  As part of our continuing mission to provide you with exceptional heart care, we have created designated Provider Care Teams.  These Care Teams include your primary Cardiologist (physician) and Advanced Practice Providers (APPs -  Physician Assistants and Nurse Practitioners) who all work together to provide you with the care you need, when you need it.  We recommend signing up for the patient portal called "MyChart".  Sign up information is provided on this After Visit Summary.  MyChart is used to connect with patients for Virtual Visits (Telemedicine).  Patients are able to view lab/test results, encounter notes, upcoming appointments, etc.  Non-urgent messages can be sent to your provider as well.   To learn more about what you can do with MyChart, go to [email or web address].    Your next appointment:   As needed  Call once end of service and we will set appointment to have loop recorder removed.  The format for your next appointment:   In Person  Provider:   Lewayne Bunting, MD{or one of the following Advanced Practice Providers on your designated Care Team:   Francis Dowse, [place] Casimiro Needle "Mardelle Matte" [place], [place] Earnest Rosier, NP  Remote monitoring is used to monitor your  Pacemaker/ ICD from home. This monitoring reduces the number of office visits required to check your device to one time per year. It allows [place] to keep an eye on the functioning of your device to ensure it is working properly.   Important Information About Sugar

## 2023-05-01 NOTE — Progress Notes (Signed)
HPI Jacqueline Bruce returns today for ongoing followup. She has a h/o cryptogenic stroke, s/p ILR insertion almost 3 years ago. In the interim she notes easy bruisability. No palpitations. No syncope. She is walking/hiking regularly but does have trouble with the humidity. No palpitations. No neuro symptoms.  No Known Allergies   Current Outpatient Medications  Medication Sig Dispense Refill   Ascorbic Acid (VITAMIN C) 1000 MG tablet Take 1,000 mg by mouth in the morning.     aspirin EC 81 MG tablet Take 81 mg by mouth in the morning. Swallow whole.     cetirizine (ZYRTEC ALLERGY) 10 MG tablet Take 5-10 mg by mouth daily.     cholecalciferol (VITAMIN D3) 25 MCG (1000 UNIT) tablet Take 1,000 Units by mouth in the morning and at bedtime.     clonazePAM (KLONOPIN) 1 MG tablet Take 0.5-1 mg by mouth at bedtime.     ferrous sulfate 325 (65 FE) MG tablet Take 325 mg by mouth in the morning.     ibandronate (BONIVA) 150 MG tablet Take 150 mg by mouth every 30 (thirty) days.     ibuprofen (ADVIL) 200 MG tablet Take 400 mg by mouth every 8 (eight) hours as needed (pain.).     ketorolac (ACULAR) 0.5 % ophthalmic solution Place 1 drop into both eyes 4 (four) times daily as needed (itching/irritated eyes). USES AS PRN     Multiple Vitamins-Minerals (PRESERVISION AREDS 2 PO) Take 1 tablet by mouth in the morning and at bedtime.     Omega-3 Fatty Acids (FISH OIL CONCENTRATE PO) Take 1,200 mg by mouth in the morning.     simvastatin (ZOCOR) 40 MG tablet Take 40 mg by mouth every evening.     No current facility-administered medications for this visit.     Past Medical History:  Diagnosis Date   Acute CVA (cerebrovascular accident) (HCC) 04/16/2019   Allergy    Anxiety 04/15/2019   Bradycardia with 41-50 beats per minute 04/15/2019   Factor V Leiden mutation (HCC)    Osteopenia 04/15/2019   Slurred speech 04/15/2019   Status post placement of implantable loop recorder    TIA due to embolism  (HCC) 04/15/2019    ROS:   All systems reviewed and negative except as noted in the HPI.   Past Surgical History:  Procedure Laterality Date   BUBBLE STUDY  04/25/2019   Procedure: BUBBLE STUDY;  Surgeon: Pricilla Riffle, MD;  Location: [facility] ENDOSCOPY;  Service: Cardiovascular;;   PARATHYROIDECTOMY Right 03/15/2023   Procedure: RIGHT INFERIOR PARATHYROIDECTOMY;  Surgeon: Darnell Level, MD;  Location: [facility] ORS;  Service: General;  Laterality: Right;   TEE WITHOUT CARDIOVERSION N/A 04/25/2019   Procedure: TRANSESOPHAGEAL ECHOCARDIOGRAM (TEE);  Surgeon: Pricilla Riffle, MD;  Location: [facility] ENDOSCOPY;  Service: Cardiovascular;  Laterality: N/A;     Family History  Problem Relation Age of Onset   Hypertension Mother    Factor V Leiden deficiency Mother        no history of DVT or PE   Alzheimer's disease Mother    Hypertension Father    Heart disease Father        pacemaker and defib implanted    Atrial fibrillation Father    Stroke Father        at 65   Factor V Leiden deficiency Sister        no history of DVT or PE     Social History   Socioeconomic History  Marital status: Married    Spouse name: Not on file   Number of children: Not on file   Years of education: Not on file   Highest education level: Not on file  Occupational History   Not on file  Tobacco Use   Smoking status: Never   Smokeless tobacco: Never  Vaping Use   Vaping status: Never Used  Substance and Sexual Activity   Alcohol use: Yes    Comment: about 3 oz. some nights   Drug use: No   Sexual activity: Not on file  Other Topics Concern   Not on file  Social History Narrative   Lives with husband    Right handed   Caffeine: 1 cup of coffee a day. Diet coke qotherd    Social Determinants of Corporate investment banker Strain: Not on file  Food Insecurity: Not on file  Transportation Needs: Not on file  Physical Activity: Not on file  Stress: Not on file  Social Connections: Not on file  Intimate  Partner Violence: Not on file     BP 104/66   Pulse (!) 48   Ht 5\' 7"  (1.702 m)   Wt 143 lb 12.8 oz (65.2 kg)   SpO2 98%   BMI 22.52 kg/m   Physical Exam:  Well appearing NAD HEENT: Unremarkable Neck:  No JVD, no thyromegally Lymphatics:  No adenopathy Back:  No CVA tenderness Lungs:  Clear HEART:  Regular rate rhythm, no murmurs, no rubs, no clicks Abd:  soft, positive bowel sounds, no organomegally, no rebound, no guarding Ext:  2 plus pulses, no edema, no cyanosis, no clubbing Skin:  No rashes no nodules Neuro:  CN II through XII intact, motor grossly intact  EKG - sinus brady at 48/min  DEVICE  Normal device function.  See PaceArt for details. Still not yet at [facility].  Assess/Plan:  Cryptogenic stroke - no clear etiology. She has not had atrial fib.  ILR - she is still about 3-6 months from ERI. We will continue to follow. When she is at [facility], we will remove her ILR. Sinus node dysfunction - her HR's are low but she is asymptomatic. She will undergo watchful waiting HTN - her bp is well controlled. We will continue current meds.   Jacqueline Gowda Debora Stockdale,MD

## 2023-05-02 DIAGNOSIS — M25511 Pain in right shoulder: Secondary | ICD-10-CM | POA: Diagnosis not present

## 2023-05-03 NOTE — Progress Notes (Signed)
Carelink Summary Report / Loop Recorder 

## 2023-05-14 ENCOUNTER — Ambulatory Visit: Payer: Medicare Other

## 2023-05-14 DIAGNOSIS — I639 Cerebral infarction, unspecified: Secondary | ICD-10-CM | POA: Diagnosis not present

## 2023-05-14 LAB — CUP PACEART REMOTE DEVICE CHECK
Date Time Interrogation Session: 20241220230204
Implantable Pulse Generator Implant Date: 20210107

## 2023-05-15 DIAGNOSIS — H353132 Nonexudative age-related macular degeneration, bilateral, intermediate dry stage: Secondary | ICD-10-CM | POA: Diagnosis not present

## 2023-05-22 ENCOUNTER — Telehealth: Payer: Self-pay | Admitting: Internal Medicine

## 2023-05-22 NOTE — Telephone Encounter (Signed)
 Called and spoke to Diane. Advised no issues with having a loop recorder and bone density test. Appreciative of call back.

## 2023-05-22 NOTE — Telephone Encounter (Signed)
[facility] is calling to ask questions in regards to loop recorder and if it will prevent the patient from having a bone density type test. Please advise.

## 2023-05-29 DIAGNOSIS — M8589 Other specified disorders of bone density and structure, multiple sites: Secondary | ICD-10-CM | POA: Diagnosis not present

## 2023-05-29 DIAGNOSIS — M810 Age-related osteoporosis without current pathological fracture: Secondary | ICD-10-CM | POA: Diagnosis not present

## 2023-06-13 DIAGNOSIS — M7541 Impingement syndrome of right shoulder: Secondary | ICD-10-CM | POA: Diagnosis not present

## 2023-06-14 DIAGNOSIS — H353132 Nonexudative age-related macular degeneration, bilateral, intermediate dry stage: Secondary | ICD-10-CM | POA: Diagnosis not present

## 2023-06-18 ENCOUNTER — Ambulatory Visit: Payer: Medicare Other

## 2023-06-18 ENCOUNTER — Encounter: Payer: Self-pay | Admitting: Internal Medicine

## 2023-06-18 DIAGNOSIS — I639 Cerebral infarction, unspecified: Secondary | ICD-10-CM | POA: Diagnosis not present

## 2023-06-18 LAB — CUP PACEART REMOTE DEVICE CHECK
Date Time Interrogation Session: 20250126230229
Implantable Pulse Generator Implant Date: 20210107

## 2023-06-21 NOTE — Progress Notes (Signed)
Carelink Summary Report / Loop Recorder

## 2023-07-14 DIAGNOSIS — H353132 Nonexudative age-related macular degeneration, bilateral, intermediate dry stage: Secondary | ICD-10-CM | POA: Diagnosis not present

## 2023-07-19 DIAGNOSIS — N1831 Chronic kidney disease, stage 3a: Secondary | ICD-10-CM | POA: Diagnosis not present

## 2023-07-19 DIAGNOSIS — Z8639 Personal history of other endocrine, nutritional and metabolic disease: Secondary | ICD-10-CM | POA: Diagnosis not present

## 2023-07-19 DIAGNOSIS — D6851 Activated protein C resistance: Secondary | ICD-10-CM | POA: Diagnosis not present

## 2023-07-19 DIAGNOSIS — E892 Postprocedural hypoparathyroidism: Secondary | ICD-10-CM | POA: Diagnosis not present

## 2023-07-19 DIAGNOSIS — R001 Bradycardia, unspecified: Secondary | ICD-10-CM | POA: Diagnosis not present

## 2023-07-19 DIAGNOSIS — M8588 Other specified disorders of bone density and structure, other site: Secondary | ICD-10-CM | POA: Diagnosis not present

## 2023-07-23 ENCOUNTER — Ambulatory Visit: Payer: Medicare Other

## 2023-07-23 DIAGNOSIS — I639 Cerebral infarction, unspecified: Secondary | ICD-10-CM | POA: Diagnosis not present

## 2023-07-24 LAB — CUP PACEART REMOTE DEVICE CHECK
Date Time Interrogation Session: 20250302230051
Implantable Pulse Generator Implant Date: 20210107

## 2023-07-25 ENCOUNTER — Encounter: Payer: Self-pay | Admitting: Internal Medicine

## 2023-07-27 NOTE — Progress Notes (Signed)
 Carelink Summary Report / Loop Recorder

## 2023-08-13 DIAGNOSIS — H353132 Nonexudative age-related macular degeneration, bilateral, intermediate dry stage: Secondary | ICD-10-CM | POA: Diagnosis not present

## 2023-08-24 NOTE — Progress Notes (Signed)
 Carelink Summary Report / Loop Recorder

## 2023-08-24 NOTE — Addendum Note (Signed)
 Addended by: Geralyn Flash D on: 08/24/2023 09:56 AM   Modules accepted: Orders

## 2023-08-27 ENCOUNTER — Ambulatory Visit (INDEPENDENT_AMBULATORY_CARE_PROVIDER_SITE_OTHER): Payer: Medicare Other

## 2023-08-27 DIAGNOSIS — I639 Cerebral infarction, unspecified: Secondary | ICD-10-CM

## 2023-08-28 ENCOUNTER — Encounter: Payer: Self-pay | Admitting: Internal Medicine

## 2023-08-28 LAB — CUP PACEART REMOTE DEVICE CHECK
Date Time Interrogation Session: 20250406230104
Implantable Pulse Generator Implant Date: 20210107

## 2023-09-12 DIAGNOSIS — H353132 Nonexudative age-related macular degeneration, bilateral, intermediate dry stage: Secondary | ICD-10-CM | POA: Diagnosis not present

## 2023-09-19 ENCOUNTER — Telehealth: Payer: Self-pay | Admitting: Internal Medicine

## 2023-09-19 NOTE — Telephone Encounter (Signed)
 New Message:      Patient said she would like to be scheduled for a date to have her device taken out please

## 2023-09-20 NOTE — Telephone Encounter (Signed)
 Patient is scheduled for a loop explant on 7/11 with Dr. Carolynne Citron.

## 2023-10-01 ENCOUNTER — Ambulatory Visit (INDEPENDENT_AMBULATORY_CARE_PROVIDER_SITE_OTHER): Payer: Medicare Other

## 2023-10-01 DIAGNOSIS — I639 Cerebral infarction, unspecified: Secondary | ICD-10-CM | POA: Diagnosis not present

## 2023-10-01 LAB — CUP PACEART REMOTE DEVICE CHECK
Date Time Interrogation Session: 20250511230858
Implantable Pulse Generator Implant Date: 20210107

## 2023-10-02 ENCOUNTER — Ambulatory Visit: Payer: Self-pay | Admitting: Internal Medicine

## 2023-10-11 NOTE — Progress Notes (Signed)
 Carelink Summary Report / Loop Recorder

## 2023-10-11 NOTE — Addendum Note (Signed)
 Addended by: Edra Govern D on: 10/11/2023 03:24 PM   Modules accepted: Orders

## 2023-11-01 ENCOUNTER — Ambulatory Visit (INDEPENDENT_AMBULATORY_CARE_PROVIDER_SITE_OTHER)

## 2023-11-01 DIAGNOSIS — I639 Cerebral infarction, unspecified: Secondary | ICD-10-CM | POA: Diagnosis not present

## 2023-11-04 ENCOUNTER — Ambulatory Visit: Payer: Self-pay | Admitting: Internal Medicine

## 2023-11-11 DIAGNOSIS — H353132 Nonexudative age-related macular degeneration, bilateral, intermediate dry stage: Secondary | ICD-10-CM | POA: Diagnosis not present

## 2023-11-19 NOTE — Progress Notes (Signed)
 Carelink Summary Report / Loop Recorder

## 2023-11-21 DIAGNOSIS — C44529 Squamous cell carcinoma of skin of other part of trunk: Secondary | ICD-10-CM | POA: Diagnosis not present

## 2023-11-21 DIAGNOSIS — L814 Other melanin hyperpigmentation: Secondary | ICD-10-CM | POA: Diagnosis not present

## 2023-11-21 DIAGNOSIS — C44629 Squamous cell carcinoma of skin of left upper limb, including shoulder: Secondary | ICD-10-CM | POA: Diagnosis not present

## 2023-11-21 DIAGNOSIS — L570 Actinic keratosis: Secondary | ICD-10-CM | POA: Diagnosis not present

## 2023-11-21 DIAGNOSIS — L821 Other seborrheic keratosis: Secondary | ICD-10-CM | POA: Diagnosis not present

## 2023-11-21 DIAGNOSIS — D2262 Melanocytic nevi of left upper limb, including shoulder: Secondary | ICD-10-CM | POA: Diagnosis not present

## 2023-11-21 DIAGNOSIS — L718 Other rosacea: Secondary | ICD-10-CM | POA: Diagnosis not present

## 2023-11-21 DIAGNOSIS — L565 Disseminated superficial actinic porokeratosis (DSAP): Secondary | ICD-10-CM | POA: Diagnosis not present

## 2023-11-21 DIAGNOSIS — Z85828 Personal history of other malignant neoplasm of skin: Secondary | ICD-10-CM | POA: Diagnosis not present

## 2023-11-21 DIAGNOSIS — D2261 Melanocytic nevi of right upper limb, including shoulder: Secondary | ICD-10-CM | POA: Diagnosis not present

## 2023-11-21 DIAGNOSIS — D224 Melanocytic nevi of scalp and neck: Secondary | ICD-10-CM | POA: Diagnosis not present

## 2023-11-21 DIAGNOSIS — D225 Melanocytic nevi of trunk: Secondary | ICD-10-CM | POA: Diagnosis not present

## 2023-11-30 ENCOUNTER — Encounter: Payer: Self-pay | Admitting: Internal Medicine

## 2023-11-30 ENCOUNTER — Ambulatory Visit: Attending: Internal Medicine | Admitting: Internal Medicine

## 2023-11-30 VITALS — BP 122/70 | HR 54 | Ht 67.0 in | Wt 144.8 lb

## 2023-11-30 DIAGNOSIS — I495 Sick sinus syndrome: Secondary | ICD-10-CM

## 2023-11-30 DIAGNOSIS — Z8673 Personal history of transient ischemic attack (TIA), and cerebral infarction without residual deficits: Secondary | ICD-10-CM

## 2023-11-30 DIAGNOSIS — R001 Bradycardia, unspecified: Secondary | ICD-10-CM

## 2023-11-30 NOTE — Patient Instructions (Addendum)
 For a copy of Medical consent you may contact Medical recordsat [phone]  Medication Instructions:  Your physician recommends that you continue on your current medications as directed. Please refer to the Current Medication list given to you today.  *If you need a refill on your cardiac medications before your next appointment, please call your pharmacy*  Lab Work: None ordered.  You may go to any [facility] Location for your lab work:  [facility] - [address] ([facility]) - [address] [phone] [address]  [place] - [address]   [place]  - [address]   [place] - [address] - [address] [employer]  - [address] [place] - [address] ([facility]   If you have labs (blood work) drawn today and your tests are completely normal, you will receive your results only by: [employer] (if you have MyChart)  If you have any lab test that is abnormal or we need to change your treatment, we will call you or send a MyChart message to review the results.  Testing/Procedures: None ordered.  Follow-Up: At [facility], you and your health needs are our priority.  As part of our continuing mission to provide you with exceptional heart care, we have created designated Provider Care Teams.  These Care Teams include your primary Cardiologist (physician) and Advanced Practice Providers (APPs -  Physician Assistants and Nurse Practitioners) who all work together to provide you with the care you need, when you need it.  We recommend signing up for the patient portal called MyChart.  Sign up information is provided on this After Visit Summary.  MyChart is used to connect with patients for Virtual Visits (Telemedicine).  Patients are able to view lab/test results, encounter notes, upcoming appointments, etc.  Non-urgent messages can be sent to your provider as  well.   To learn more about what you can do with MyChart, go to [email or web address].    Medication Instructions:  Your physician recommends that you continue on your current medications as directed. Please refer to the Current Medication list given to you today.  Labwork: None ordered.  Testing/Procedures: None ordered.  Follow-Up:  Implantable Loop Recorder Removal, Care After This sheet gives you information about how to care for yourself after your procedure. Your health care provider may also give you more specific instructions. If you have problems or questions, contact your health care provider. What can I expect after the procedure? After the procedure, it is common to have: Soreness or discomfort near the incision. Some swelling or bruising near the incision.  Follow these instructions at home: Incision care  Monitor your cardiac device site for redness, swelling, and drainage. Call the device clinic at [phone] if you experience these symptoms or fever/chills.  Keep the large square bandage on your site for 24 hours and then you may remove it yourself. Keep the steri-strips underneath in place.   You may shower after 72 hours / 3 days from your procedure with the steri-strips in place. They will usually fall off on their own, or may be removed after 10 days. Pat dry.   Avoid lotions, ointments, or perfumes over your incision until it is well-healed.  Please do not submerge in water until your site is completely healed.   If your wound site starts to bleed apply pressure.       *  If you have any questions/concerns please call the device clinic at [phone].*  Activity  Return to your normal activities.  Contact a health care provider if: You have redness, swelling, or pain around your incision. You have a fever.   Your next appointment:   As needed  The format for your next appointment:   In Person  Provider:   Danelle Birmingham, Jacqueline Bruce{or one of the  following Advanced Practice Providers on your designated Care Team:   Charlies Arthur, [place] Ozell Jodie Passey, [place] Leotis Barrack, NP  Note: Remote monitoring is used to monitor your Pacemaker/ ICD from home. This monitoring reduces the number of office visits required to check your device to one time per year. It allows us  to keep an eye on the functioning of your device to ensure it is working properly.

## 2023-11-30 NOTE — Progress Notes (Signed)
 HPI Jacqueline Bruce returns today for ongoing followup. She has a h/o cryptogenic stroke, s/p ILR insertion almost 4 years ago. In the interim she notes easy bruisability. No palpitations. No syncope. She is walking/hiking regularly but does have trouble with the humidity. No palpitations. No neuro symptoms. She would like to have her ILR removed.  No Known Allergies   Current Outpatient Medications  Medication Sig Dispense Refill   Ascorbic Acid (VITAMIN C) 1000 MG tablet Take 1,000 mg by mouth in the morning.     aspirin  EC 81 MG tablet Take 81 mg by mouth in the morning. Swallow whole.     cetirizine (ZYRTEC ALLERGY) 10 MG tablet Take 5-10 mg by mouth daily.     cholecalciferol (VITAMIN D3) 25 MCG (1000 UNIT) tablet Take 1,000 Units by mouth in the morning and at bedtime.     clonazePAM (KLONOPIN) 1 MG tablet Take 0.5-1 mg by mouth at bedtime.     ferrous sulfate 325 (65 FE) MG tablet Take 325 mg by mouth in the morning.     ibuprofen (ADVIL) 200 MG tablet Take 400 mg by mouth every 8 (eight) hours as needed (pain.).     ketorolac (ACULAR) 0.5 % ophthalmic solution Place 1 drop into both eyes 4 (four) times daily as needed (itching/irritated eyes). USES AS PRN     Multiple Vitamins-Minerals (PRESERVISION AREDS 2 PO) Take 1 tablet by mouth in the morning and at bedtime.     Omega-3 Fatty Acids (FISH OIL CONCENTRATE PO) Take 1,200 mg by mouth in the morning.     simvastatin  (ZOCOR ) 40 MG tablet Take 40 mg by mouth every evening.     ibandronate (BONIVA) 150 MG tablet Take 150 mg by mouth every 30 (thirty) days.     No current facility-administered medications for this visit.     Past Medical History:  Diagnosis Date   Acute CVA (cerebrovascular accident) (HCC) 04/16/2019   Allergy    Anxiety 04/15/2019   Bradycardia with 41-50 beats per minute 04/15/2019   Factor V Leiden mutation (HCC)    Osteopenia 04/15/2019   Slurred speech 04/15/2019   Status post placement of  implantable loop recorder    TIA due to embolism (HCC) 04/15/2019    ROS:   All systems reviewed and negative except as noted in the HPI.   Past Surgical History:  Procedure Laterality Date   BUBBLE STUDY  04/25/2019   Procedure: BUBBLE STUDY;  Surgeon: Okey Vina GAILS, MD;  Location: [facility] ENDOSCOPY;  Service: Cardiovascular;;   PARATHYROIDECTOMY Right 03/15/2023   Procedure: RIGHT INFERIOR PARATHYROIDECTOMY;  Surgeon: Eletha Boas, MD;  Location: [facility] ORS;  Service: General;  Laterality: Right;   TEE WITHOUT CARDIOVERSION N/A 04/25/2019   Procedure: TRANSESOPHAGEAL ECHOCARDIOGRAM (TEE);  Surgeon: Okey Vina GAILS, MD;  Location: [facility] ENDOSCOPY;  Service: Cardiovascular;  Laterality: N/A;     Family History  Problem Relation Age of Onset   Hypertension Mother    Factor V Leiden deficiency Mother        no history of DVT or PE   Alzheimer's disease Mother    Hypertension Father    Heart disease Father        pacemaker and defib implanted    Atrial fibrillation Father    Stroke Father        at 13   Factor V Leiden deficiency Sister        no history of DVT or PE  Social History   Socioeconomic History   Marital status: Married    Spouse name: Not on file   Number of children: Not on file   Years of education: Not on file   Highest education level: Not on file  Occupational History   Not on file  Tobacco Use   Smoking status: Never   Smokeless tobacco: Never  Vaping Use   Vaping status: Never Used  Substance and Sexual Activity   Alcohol use: Yes    Comment: about 3 oz. some nights   Drug use: No   Sexual activity: Not on file  Other Topics Concern   Not on file  Social History Narrative   Lives with husband    Right handed   Caffeine: 1 cup of coffee a day. Diet coke qotherd    Social Drivers of Corporate investment banker Strain: Not on file  Food Insecurity: Not on file  Transportation Needs: Not on file  Physical Activity: Not on file  Stress: Not on file   Social Connections: Not on file  Intimate Partner Violence: Not on file     BP 122/70   Pulse (!) 54   Ht 5' 7 (1.702 m)   Wt 144 lb 12.8 oz (65.7 kg)   SpO2 90%   BMI 22.68 kg/m   Physical Exam:  Well appearing NAD HEENT: Unremarkable Neck:  No JVD, no thyromegally Lymphatics:  No adenopathy Back:  No CVA tenderness Lungs:  Clear HEART:  Regular rate rhythm, no murmurs, no rubs, no clicks Abd:  soft, positive bowel sounds, no organomegally, no rebound, no guarding Ext:  2 plus pulses, no edema, no cyanosis, no clubbing Skin:  No rashes no nodules Neuro:  CN II through XII intact, motor grossly intact  EKG - sinus bradycardia  DEVICE  Normal device function.  See PaceArt for details.   Assess/Plan:  Cryptogenic stroke - no clear etiology. She has not had atrial fib. She would like her ILR removed. ILR - we will remove today. Sinus node dysfunction - her HR's are low but she is asymptomatic. She will undergo watchful waiting HTN - her bp is well controlled. We will continue current meds.  EP Procedure Note  Preop diagnosis: cryptogenic stroke  Postop diagnosis: same as preop.  Procedure Performed: ILR removal.  Description of the procedure: after informed consent obtained and the appropriate time out performed, the patient was prepped and draped in a sterile fashion. 5 cc of lidocaine  was infiltrated into the left pectoral region. A one cm stab incision was carried out. A combination of blunt and sharp dissection was carried out. The ILR was grasped and removed with gentle traction. Hemostasis was assured. Benzoin and steri strips were painted on the skin and the patient recovered in the usual manner.   Complications: none  Conclusion: successful ILR removal.    Danelle Waddell COME

## 2023-12-03 ENCOUNTER — Encounter

## 2023-12-04 DIAGNOSIS — H353132 Nonexudative age-related macular degeneration, bilateral, intermediate dry stage: Secondary | ICD-10-CM | POA: Diagnosis not present

## 2023-12-11 DIAGNOSIS — H353132 Nonexudative age-related macular degeneration, bilateral, intermediate dry stage: Secondary | ICD-10-CM | POA: Diagnosis not present

## 2023-12-24 NOTE — Addendum Note (Signed)
 Addended by: VICCI SELLER A on: 12/24/2023 10:10 AM   Modules accepted: Orders

## 2023-12-24 NOTE — Progress Notes (Signed)
 Carelink Summary Report / Loop Recorder

## 2023-12-28 DIAGNOSIS — H353122 Nonexudative age-related macular degeneration, left eye, intermediate dry stage: Secondary | ICD-10-CM | POA: Diagnosis not present

## 2023-12-28 DIAGNOSIS — H2513 Age-related nuclear cataract, bilateral: Secondary | ICD-10-CM | POA: Diagnosis not present

## 2023-12-28 DIAGNOSIS — H43823 Vitreomacular adhesion, bilateral: Secondary | ICD-10-CM | POA: Diagnosis not present

## 2023-12-28 DIAGNOSIS — H353113 Nonexudative age-related macular degeneration, right eye, advanced atrophic without subfoveal involvement: Secondary | ICD-10-CM | POA: Diagnosis not present

## 2024-01-03 ENCOUNTER — Encounter

## 2024-01-10 DIAGNOSIS — H353132 Nonexudative age-related macular degeneration, bilateral, intermediate dry stage: Secondary | ICD-10-CM | POA: Diagnosis not present

## 2024-01-16 DIAGNOSIS — N1831 Chronic kidney disease, stage 3a: Secondary | ICD-10-CM | POA: Diagnosis not present

## 2024-01-16 DIAGNOSIS — D6851 Activated protein C resistance: Secondary | ICD-10-CM | POA: Diagnosis not present

## 2024-01-16 DIAGNOSIS — Z8673 Personal history of transient ischemic attack (TIA), and cerebral infarction without residual deficits: Secondary | ICD-10-CM | POA: Diagnosis not present

## 2024-01-16 DIAGNOSIS — M8589 Other specified disorders of bone density and structure, multiple sites: Secondary | ICD-10-CM | POA: Diagnosis not present

## 2024-02-04 ENCOUNTER — Encounter

## 2024-02-09 DIAGNOSIS — H353132 Nonexudative age-related macular degeneration, bilateral, intermediate dry stage: Secondary | ICD-10-CM | POA: Diagnosis not present

## 2024-03-06 ENCOUNTER — Encounter

## 2024-03-10 DIAGNOSIS — H353132 Nonexudative age-related macular degeneration, bilateral, intermediate dry stage: Secondary | ICD-10-CM | POA: Diagnosis not present

## 2024-04-07 ENCOUNTER — Encounter
